# Patient Record
Sex: Male | Born: 1945
Health system: Southern US, Community
[De-identification: ages and names within clinical notes are randomized; demographics above are authoritative.]

## PROBLEM LIST (undated history)

## (undated) DIAGNOSIS — I619 Nontraumatic intracerebral hemorrhage, unspecified: Secondary | ICD-10-CM

## (undated) DIAGNOSIS — E559 Vitamin D deficiency, unspecified: Secondary | ICD-10-CM

## (undated) DIAGNOSIS — E291 Testicular hypofunction: Secondary | ICD-10-CM

## (undated) DIAGNOSIS — E785 Hyperlipidemia, unspecified: Secondary | ICD-10-CM

## (undated) DIAGNOSIS — C61 Malignant neoplasm of prostate: Secondary | ICD-10-CM

## (undated) DIAGNOSIS — I10 Essential (primary) hypertension: Secondary | ICD-10-CM

## (undated) DIAGNOSIS — I2510 Atherosclerotic heart disease of native coronary artery without angina pectoris: Secondary | ICD-10-CM

## (undated) DIAGNOSIS — I4821 Permanent atrial fibrillation: Secondary | ICD-10-CM

## (undated) DIAGNOSIS — I4891 Unspecified atrial fibrillation: Secondary | ICD-10-CM

## (undated) DIAGNOSIS — F419 Anxiety disorder, unspecified: Secondary | ICD-10-CM

## (undated) HISTORY — PX: COLONOSCOPY: SHX174

## (undated) HISTORY — DX: Unspecified atrial fibrillation: I48.91

## (undated) HISTORY — DX: Hyperlipidemia, unspecified: E78.5

## (undated) HISTORY — DX: Nontraumatic intracerebral hemorrhage, unspecified: I61.9

## (undated) HISTORY — DX: Malignant neoplasm of prostate: C61

## (undated) HISTORY — DX: Permanent atrial fibrillation: I48.21

## (undated) HISTORY — DX: Vitamin D deficiency, unspecified: E55.9

## (undated) HISTORY — DX: Atherosclerotic heart disease of native coronary artery without angina pectoris: I25.10

## (undated) HISTORY — DX: Testicular hypofunction: E29.1

---

## 1999-10-07 ENCOUNTER — Ambulatory Visit (HOSPITAL_COMMUNITY): Admission: RE | Admit: 1999-10-07 | Discharge: 1999-10-07 | Payer: Self-pay | Admitting: Internal Medicine

## 1999-10-07 ENCOUNTER — Encounter: Payer: Self-pay | Admitting: Internal Medicine

## 1999-10-10 ENCOUNTER — Ambulatory Visit (HOSPITAL_COMMUNITY): Admission: RE | Admit: 1999-10-10 | Discharge: 1999-10-10 | Payer: Self-pay | Admitting: Internal Medicine

## 1999-10-10 ENCOUNTER — Encounter: Payer: Self-pay | Admitting: Internal Medicine

## 2005-02-03 ENCOUNTER — Ambulatory Visit (HOSPITAL_COMMUNITY): Admission: RE | Admit: 2005-02-03 | Discharge: 2005-02-03 | Payer: Self-pay | Admitting: Internal Medicine

## 2005-03-17 ENCOUNTER — Ambulatory Visit (HOSPITAL_COMMUNITY): Admission: RE | Admit: 2005-03-17 | Discharge: 2005-03-17 | Payer: Self-pay | Admitting: Internal Medicine

## 2005-12-28 ENCOUNTER — Ambulatory Visit (HOSPITAL_COMMUNITY): Admission: RE | Admit: 2005-12-28 | Discharge: 2005-12-28 | Payer: Self-pay | Admitting: Internal Medicine

## 2009-07-09 ENCOUNTER — Emergency Department (HOSPITAL_COMMUNITY): Admission: EM | Admit: 2009-07-09 | Discharge: 2009-07-09 | Payer: Self-pay | Admitting: Emergency Medicine

## 2010-03-16 ENCOUNTER — Encounter: Payer: Self-pay | Admitting: Internal Medicine

## 2010-05-12 LAB — POCT CARDIAC MARKERS
Myoglobin, poc: 75.2 ng/mL (ref 12–200)
Myoglobin, poc: 88 ng/mL (ref 12–200)
Troponin i, poc: <0.05 ng/mL (ref 0.00–0.09)

## 2011-12-22 ENCOUNTER — Encounter: Payer: Self-pay | Admitting: Gastroenterology

## 2012-03-04 ENCOUNTER — Ambulatory Visit (AMBULATORY_SURGERY_CENTER): Payer: Commercial Managed Care - PPO | Admitting: *Deleted

## 2012-03-04 VITALS — Ht 79.0 in | Wt 247.0 lb

## 2012-03-04 DIAGNOSIS — Z1211 Encounter for screening for malignant neoplasm of colon: Secondary | ICD-10-CM

## 2012-03-04 MED ORDER — NA SULFATE-K SULFATE-MG SULF 17.5-3.13-1.6 GM/177ML PO SOLN: ORAL | Status: DC

## 2012-03-17 ENCOUNTER — Ambulatory Visit (AMBULATORY_SURGERY_CENTER): Payer: Commercial Managed Care - PPO | Admitting: Gastroenterology

## 2012-03-17 ENCOUNTER — Encounter: Payer: Self-pay | Admitting: Gastroenterology

## 2012-03-17 VITALS — BP 145/87 | HR 53 | Temp 96.3°F | Resp 15 | Ht 79.0 in | Wt 247.0 lb

## 2012-03-17 DIAGNOSIS — Z1211 Encounter for screening for malignant neoplasm of colon: Secondary | ICD-10-CM

## 2012-03-17 MED ORDER — SODIUM CHLORIDE 0.9 % IV SOLN: 500.0000 mL | INTRAVENOUS | Status: DC

## 2012-03-17 NOTE — Progress Notes (Signed)
VSS A&O x3 Pleased with anesthesia care. Report to Rosalita Chessman RN DRM

## 2012-03-17 NOTE — Patient Instructions (Addendum)
YOU HAD AN ENDOSCOPIC PROCEDURE TODAY AT THE [facility]: Refer to the procedure report that was given to you for any specific questions about what was found during the examination.  If the procedure report does not answer your questions, please call your gastroenterologist to clarify.  If you requested that your care partner not be given the details of your procedure findings, then the procedure report has been included in a sealed envelope for you to review at your convenience later.  YOU SHOULD EXPECT: Some feelings of bloating in the abdomen. Passage of more gas than usual.  Walking can help get rid of the air that was put into your GI tract during the procedure and reduce the bloating. If you had a lower endoscopy (such as a colonoscopy or flexible sigmoidoscopy) you may notice spotting of blood in your stool or on the toilet paper. If you underwent a bowel prep for your procedure, then you may not have a normal bowel movement for a few days.  DIET: Your first meal following the procedure should be a light meal and then it is ok to progress to your normal diet.  A half-sandwich or bowl of soup is an example of a good first meal.  Heavy or fried foods are harder to digest and may make you feel nauseous or bloated.  Likewise meals heavy in dairy and vegetables can cause extra gas to form and this can also increase the bloating.  Drink plenty of fluids but you should avoid alcoholic beverages for 24 hours.  ACTIVITY: Your care partner should take you home directly after the procedure.  You should plan to take it easy, moving slowly for the rest of the day.  You can resume normal activity the day after the procedure however you should NOT DRIVE or use heavy machinery for 24 hours (because of the sedation medicines used during the test).    SYMPTOMS TO REPORT IMMEDIATELY: A gastroenterologist can be reached at any hour.  During normal business hours, 8:30 AM to 5:00 PM Monday through Friday,  call [phone].  After hours and on weekends, please call the GI answering service at [phone] who will take a message and have the physician on call contact you.   Following lower endoscopy (colonoscopy or flexible sigmoidoscopy):  Excessive amounts of blood in the stool  Significant tenderness or worsening of abdominal pains  Swelling of the abdomen that is new, acute  Fever of 100F or higher  FOLLOW UP: If any biopsies were taken you will be contacted by phone or by letter within the next 1-3 weeks.  Call your gastroenterologist if you have not heard about the biopsies in 3 weeks.  Our staff will call the home number listed on your records the next business day following your procedure to check on you and address any questions or concerns that you may have at that time regarding the information given to you following your procedure. This is a courtesy call and so if there is no answer at the home number and we have not heard from you through the emergency physician on call, we will assume that you have returned to your regular daily activities without incident.  SIGNATURES/CONFIDENTIALITY: You and/or your care partner have signed paperwork which will be entered into your electronic medical record.  These signatures attest to the fact that that the information above on your After Visit Summary has been reviewed and is understood.  Full responsibility of the confidentiality of this   discharge information lies with you and/or your care-partner.   Thank-you for choosing us for your healthcare needs. 

## 2012-03-17 NOTE — Progress Notes (Addendum)
Patient did not have preoperative order for IV antibiotic SSI prophylaxis. (G8918)  Patient did not experience any of the following events: a burn prior to discharge; a fall within the facility; wrong site/side/patient/procedure/implant event; or a hospital transfer or hospital admission upon discharge from the facility. (G8907)  

## 2012-03-17 NOTE — Op Note (Signed)
[facility] [address] [place], [address]   COLONOSCOPY PROCEDURE REPORT  PATIENT: Kenneth, Russell  MR#: [id number] BIRTHDATE: 1945/12/02 , 66  yrs. old GENDER: Male ENDOSCOPIST: Louis Meckel, MD REFERRED NW:[id number] Oneta Rack, M.D. PROCEDURE DATE:  03/17/2012 PROCEDURE:   Colonoscopy, diagnostic ASA CLASS:   Class I INDICATIONS: MEDICATIONS: MAC sedation, administered by CRNA and propofol (Diprivan) 200mg  IV  DESCRIPTION OF PROCEDURE:   After the risks benefits and alternatives of the procedure were thoroughly explained, informed consent was obtained.  A digital rectal exam revealed no abnormalities of the rectum.   The LB CF-[id number]  endoscope was introduced through the anus and advanced to the cecum, which was identified by both the appendix and ileocecal valve. No adverse events experienced.   The quality of the prep was Suprep excellent The instrument was then slowly withdrawn as the colon was fully examined.      COLON FINDINGS: A normal appearing cecum, ileocecal valve, and appendiceal orifice were identified.  The ascending, hepatic flexure, transverse, splenic flexure, descending, sigmoid colon and rectum appeared unremarkable.  No polyps or cancers were seen. Retroflexed views revealed no abnormalities. The time to cecum=6 minutes 22 seconds.  Withdrawal time=6 minutes 30 seconds.  The scope was withdrawn and the procedure completed. COMPLICATIONS: There were no complications.  ENDOSCOPIC IMPRESSION: Normal colon  RECOMMENDATIONS: Continue current colorectal screening recommendations for "routine risk" patients with a repeat colonoscopy in 10 years.   eSigned:  Louis Meckel, MD 03/17/2012 10:30 AM   cc:

## 2012-03-18 ENCOUNTER — Telehealth: Payer: Self-pay | Admitting: *Deleted

## 2012-03-18 NOTE — Telephone Encounter (Signed)
  Follow up Call-  Call back number 03/17/2012  Post procedure Call Back phone  # [phone]  Permission to leave phone message Yes     Patient questions:  Do you have a fever, pain , or abdominal swelling? no Pain Score  0 *  Have you tolerated food without any problems? yes  Have you been able to return to your normal activities? yes  Do you have any questions about your discharge instructions: Diet   no Medications  no Follow up visit  no  Do you have questions or concerns about your Care? no  Actions: * If pain score is 4 or above: No action needed, pain <4.

## 2012-04-28 ENCOUNTER — Other Ambulatory Visit: Payer: Self-pay | Admitting: Urology

## 2012-05-31 ENCOUNTER — Encounter (HOSPITAL_COMMUNITY): Payer: Self-pay | Admitting: Pharmacy Technician

## 2012-06-06 ENCOUNTER — Inpatient Hospital Stay (HOSPITAL_COMMUNITY): Admission: RE | Admit: 2012-06-06 | Payer: Commercial Managed Care - PPO | Source: Ambulatory Visit

## 2012-06-06 NOTE — Patient Instructions (Signed)
Kenneth Russell  06/06/2012   Your procedure is scheduled on:  06/23/12   Report to [facility] at     0900 AM.  Call this number if you have problems the morning of surgery: [phone]   Remember:   Do not eat food or drink liquids after midnight.   Take these medicines the morning of surgery with A SIP OF WATER:    Do not wear jewelry,   Do not wear lotions, powders, or perfumes.    Men may shave face and neck.  Do not bring valuables to the hospital.  Contacts, dentures or bridgework may not be worn into surgery.  Leave suitcase in the car. After surgery it may be brought to your room.  For patients admitted to the hospital, checkout time is 11:00 AM the day of  discharge.      SEE CHG INSTRUCTION SHEET    Please read over the following fact sheets that you were given: MRSA Information, coughing and deep breathing exercises, leg exercises, Blood transfusion Fact Sheet , Incentive Spirometry Fact sheet                Failure to comply with these instructions may result in cancellation of your surgery.                Patient Signature ____________________________              Nurse Signature _____________________________

## 2012-06-07 ENCOUNTER — Ambulatory Visit (HOSPITAL_COMMUNITY)
Admission: RE | Admit: 2012-06-07 | Discharge: 2012-06-07 | Disposition: A | Discharge status: Home / Self Care | Payer: Commercial Managed Care - PPO | Source: Ambulatory Visit | Attending: Urology | Admitting: Urology

## 2012-06-07 ENCOUNTER — Encounter (HOSPITAL_COMMUNITY): Payer: Self-pay

## 2012-06-07 ENCOUNTER — Encounter (HOSPITAL_COMMUNITY)
Admission: RE | Admit: 2012-06-07 | Discharge: 2012-06-07 | Disposition: A | Discharge status: Home / Self Care | Payer: Commercial Managed Care - PPO | Source: Ambulatory Visit | Attending: Urology | Admitting: Urology

## 2012-06-07 DIAGNOSIS — I10 Essential (primary) hypertension: Secondary | ICD-10-CM | POA: Insufficient documentation

## 2012-06-07 DIAGNOSIS — Z01812 Encounter for preprocedural laboratory examination: Secondary | ICD-10-CM | POA: Insufficient documentation

## 2012-06-07 DIAGNOSIS — I77819 Aortic ectasia, unspecified site: Secondary | ICD-10-CM | POA: Insufficient documentation

## 2012-06-07 DIAGNOSIS — C61 Malignant neoplasm of prostate: Secondary | ICD-10-CM | POA: Insufficient documentation

## 2012-06-07 HISTORY — DX: Essential (primary) hypertension: I10

## 2012-06-07 HISTORY — DX: Anxiety disorder, unspecified: F41.9

## 2012-06-07 LAB — CBC
Hemoglobin: 14.2 g/dL (ref 13.0–17.0)
MCH: 33.6 pg (ref 26.0–34.0)
MCV: 94.6 fL (ref 78.0–100.0)
RBC: 4.23 MIL/uL (ref 4.22–5.81)

## 2012-06-07 LAB — BASIC METABOLIC PANEL
CO2: 29 mEq/L (ref 19–32)
GFR calc non Af Amer: 72 mL/min — ABNORMAL LOW (ref >90)
Glucose, Bld: 95 mg/dL (ref 70–99)
Potassium: 4 mEq/L (ref 3.5–5.1)
Sodium: 140 mEq/L (ref 135–145)

## 2012-06-07 LAB — SURGICAL PCR SCREEN: MRSA, PCR: NEGATIVE

## 2012-06-22 NOTE — H&P (Signed)
  History of Present Illness  Mr. Kenneth Russell is a 67 year old with the following urologic history:  1) Prostate cancer: He initally presented in September 2013 with an elevated PSA of 4.05 (11% free). His PSA was repeated and had further increased to 7.42.  He underwent a prostate biopsy in Sep 2013 which demonstrated atypical glands suspicious but not definite for malignancy. He underwent a repeat biopsy in February 2014 which confirmed Gleason 3+4=7 adenocarcinoma with 5 out of 14 biopsy cores positive for malignancy.  He does have a family history of prostate cancer and his father was treated with brachytherapy in his 80s.  TNM stage: cT1c Nx Mx PSA: 7.42 Gleason score: 3+4=7 Biopsy (04/14/12): 5/14 cores positive    Left: L lateral apex (30%, 3+3=6), L apex (70%, 60%, 3+4=7, PNI), L base (5%, 3+3=6)    Right: R mid (5%, 3+3=6) Prostate volume: 26.7 cc  Nomogram OC disease: 72% EPE: 18% SVI: 5% LNI: 2.7% PFS: 92%, 88%  Baseline urinary function: IPSS 5. Baseline erectile function: SHIM score 25.  2) Testosterone deficiency: His symptoms have included fatigue and decreased energy level.  He has been treated in the past by Dr. Oneta Rack with Androderm and Androgel with both medications working equally well.  I am not aware of his baseline testerone levels. I recommended he stop therapy since there has been concern about possible prostate cancer.     Past Medical History Problems  1. History of  Hypercholesterolemia 272.0 2. History of  Hypertension 401.9  Surgical History Problems  1. History of  No Surgical Problems  Current Meds 1. Aspirin 325 MG Oral Tablet; Therapy: (Recorded:04Sep2013) to 2. Atenolol 50 MG Oral Tablet; Therapy: 08Jan2014 to 3. Doxazosin Mesylate 4 MG Oral Tablet; Therapy: (Recorded:04Sep2013) to 4. Glucosamine CAPS; Therapy: (Recorded:04Sep2013) to 5. Ibuprofen TABS; prn; Therapy: (Recorded:04Sep2013) to 6. Losartan Potassium 100 MG Oral Tablet; Therapy:  29Apr2013 to 7. Simvastatin 80 MG Oral Tablet; Therapy: 23Aug2013 to 8. Triamcinolone Acetonide 0.1 % External Cream; Therapy: 08Jan2014 to 9. ValACYclovir HCl 500 MG Oral Tablet; Therapy: (Recorded:04Sep2013) to 10. Vitamin D-3 TABS; Therapy: (Recorded:04Sep2013) to  Allergies Medication  1. No Known Drug Allergies  Family History Problems  1. Family history of  Death In The Family Father age 67-natural death 2. Family history of  Death In The Family Mother age 35-heart attack 3. Paternal history of  Prostate Cancer V16.42  Social History Problems  1. Alcohol Use 1-2 glasses per day 2. Caffeine Use 1-2 glasses per day 3. Never A Smoker 4. Occupation: Airline pilot    Physical Exam Constitutional: Well nourished and well developed . No acute distress.  Pulmonary: No respiratory distress and normal respiratory rhythm and effort.  Cardiovascular: Heart rate and rhythm are normal . No peripheral edema.  Abdomen: The abdomen is soft and nontender.     Assessment Assessed  1. Prostate Cancer 185   Discussion/Summary  1. Prostate cancer: He has elected surgical treatment and will undergo a bilateral nerve sparing robotic-assisted laparoscopic radical prostatectomy and pelvic lymphadenectomy.

## 2012-06-23 ENCOUNTER — Inpatient Hospital Stay (HOSPITAL_COMMUNITY): Payer: Commercial Managed Care - PPO | Admitting: *Deleted

## 2012-06-23 ENCOUNTER — Encounter (HOSPITAL_COMMUNITY): Payer: Self-pay | Admitting: *Deleted

## 2012-06-23 ENCOUNTER — Inpatient Hospital Stay (HOSPITAL_COMMUNITY)
Admission: RE | Admit: 2012-06-23 | Discharge: 2012-06-24 | DRG: 708 | Disposition: A | Discharge status: Home / Self Care | Payer: Commercial Managed Care - PPO | Source: Ambulatory Visit | Attending: Urology | Admitting: Urology

## 2012-06-23 ENCOUNTER — Encounter (HOSPITAL_COMMUNITY)
Admission: RE | Disposition: A | Discharge status: Home / Self Care | Payer: Self-pay | Source: Ambulatory Visit | Attending: Urology

## 2012-06-23 DIAGNOSIS — Z79899 Other long term (current) drug therapy: Secondary | ICD-10-CM

## 2012-06-23 DIAGNOSIS — E7800 Pure hypercholesterolemia, unspecified: Secondary | ICD-10-CM | POA: Diagnosis present

## 2012-06-23 DIAGNOSIS — C61 Malignant neoplasm of prostate: Principal | ICD-10-CM | POA: Diagnosis present

## 2012-06-23 DIAGNOSIS — Z7982 Long term (current) use of aspirin: Secondary | ICD-10-CM

## 2012-06-23 DIAGNOSIS — Z791 Long term (current) use of non-steroidal anti-inflammatories (NSAID): Secondary | ICD-10-CM

## 2012-06-23 DIAGNOSIS — I10 Essential (primary) hypertension: Secondary | ICD-10-CM | POA: Diagnosis present

## 2012-06-23 HISTORY — PX: LYMPHADENECTOMY: SHX5960

## 2012-06-23 HISTORY — PX: ROBOT ASSISTED LAPAROSCOPIC RADICAL PROSTATECTOMY: SHX5141

## 2012-06-23 LAB — TYPE AND SCREEN: Antibody Screen: NEGATIVE

## 2012-06-23 SURGERY — ROBOTIC ASSISTED LAPAROSCOPIC RADICAL PROSTATECTOMY LEVEL 2
Anesthesia: General | Wound class: Clean Contaminated

## 2012-06-23 MED ORDER — KCL IN DEXTROSE-NACL 20-5-0.45 MEQ/L-%-% IV SOLN
INTRAVENOUS | Status: AC
  Administered 2012-06-23: 1000 mL via INTRAVENOUS
  Filled 2012-06-23: qty 1000

## 2012-06-23 MED ORDER — MORPHINE SULFATE 2 MG/ML IJ SOLN
2.0000 mg | INTRAMUSCULAR | Status: DC | PRN
  Administered 2012-06-23: 2 mg via INTRAVENOUS
  Filled 2012-06-23 (×2): qty 1

## 2012-06-23 MED ORDER — ROCURONIUM BROMIDE 100 MG/10ML IV SOLN
INTRAVENOUS | Status: DC | PRN
  Administered 2012-06-23: 50 mg via INTRAVENOUS
  Administered 2012-06-23: 10 mg via INTRAVENOUS

## 2012-06-23 MED ORDER — FENTANYL CITRATE 0.05 MG/ML IJ SOLN
INTRAMUSCULAR | Status: DC | PRN
  Administered 2012-06-23: 50 ug via INTRAVENOUS
  Administered 2012-06-23 (×2): 25 ug via INTRAVENOUS
  Administered 2012-06-23: 50 ug via INTRAVENOUS
  Administered 2012-06-23 (×2): 25 ug via INTRAVENOUS

## 2012-06-23 MED ORDER — HYDROMORPHONE HCL PF 1 MG/ML IJ SOLN
INTRAMUSCULAR | Status: AC
Start: 2012-06-23 — End: 2012-06-24
  Filled 2012-06-23: qty 1

## 2012-06-23 MED ORDER — OXYCODONE HCL 5 MG PO TABS: 5.0000 mg | ORAL_TABLET | Freq: Once | ORAL | Status: DC | PRN

## 2012-06-23 MED ORDER — DIPHENHYDRAMINE HCL 50 MG/ML IJ SOLN: 12.5000 mg | Freq: Four times a day (QID) | INTRAMUSCULAR | Status: DC | PRN

## 2012-06-23 MED ORDER — PROMETHAZINE HCL 25 MG/ML IJ SOLN: 6.2500 mg | INTRAMUSCULAR | Status: DC | PRN

## 2012-06-23 MED ORDER — DOCUSATE SODIUM 100 MG PO CAPS
100.0000 mg | ORAL_CAPSULE | Freq: Two times a day (BID) | ORAL | Status: DC
  Administered 2012-06-23 – 2012-06-24 (×2): 100 mg via ORAL
  Filled 2012-06-23 (×3): qty 1

## 2012-06-23 MED ORDER — EPHEDRINE SULFATE 50 MG/ML IJ SOLN
INTRAMUSCULAR | Status: DC | PRN
  Administered 2012-06-23 (×3): 5 mg via INTRAVENOUS
  Administered 2012-06-23: 10 mg via INTRAVENOUS
  Administered 2012-06-23: 5 mg via INTRAVENOUS
  Administered 2012-06-23: 10 mg via INTRAVENOUS
  Administered 2012-06-23: 5 mg via INTRAVENOUS

## 2012-06-23 MED ORDER — KCL IN DEXTROSE-NACL 20-5-0.45 MEQ/L-%-% IV SOLN
INTRAVENOUS | Status: DC
  Administered 2012-06-23 – 2012-06-24 (×3): via INTRAVENOUS
  Filled 2012-06-23 (×4): qty 1000

## 2012-06-23 MED ORDER — LACTATED RINGERS IV SOLN
INTRAVENOUS | Status: DC | PRN
  Administered 2012-06-23 (×3): via INTRAVENOUS

## 2012-06-23 MED ORDER — NEOSTIGMINE METHYLSULFATE 1 MG/ML IJ SOLN
INTRAMUSCULAR | Status: DC | PRN
  Administered 2012-06-23: 5 mg via INTRAVENOUS

## 2012-06-23 MED ORDER — CEFAZOLIN SODIUM-DEXTROSE 2-3 GM-% IV SOLR
2.0000 g | INTRAVENOUS | Status: AC
  Administered 2012-06-23: 2 g via INTRAVENOUS

## 2012-06-23 MED ORDER — KETOROLAC TROMETHAMINE 15 MG/ML IJ SOLN
15.0000 mg | Freq: Four times a day (QID) | INTRAMUSCULAR | Status: DC
  Administered 2012-06-23 – 2012-06-24 (×4): 15 mg via INTRAVENOUS
  Filled 2012-06-23 (×7): qty 1

## 2012-06-23 MED ORDER — BUPIVACAINE-EPINEPHRINE 0.25% -1:200000 IJ SOLN
INTRAMUSCULAR | Status: DC | PRN
  Administered 2012-06-23: 27 mL

## 2012-06-23 MED ORDER — HEPARIN SODIUM (PORCINE) 1000 UNIT/ML IJ SOLN
INTRAMUSCULAR | Status: AC
  Filled 2012-06-23: qty 1

## 2012-06-23 MED ORDER — GLYCOPYRROLATE 0.2 MG/ML IJ SOLN
INTRAMUSCULAR | Status: DC | PRN
  Administered 2012-06-23: 0.6 mg via INTRAVENOUS

## 2012-06-23 MED ORDER — CEFAZOLIN SODIUM 1-5 GM-% IV SOLN
1.0000 g | Freq: Three times a day (TID) | INTRAVENOUS | Status: AC
  Administered 2012-06-23 – 2012-06-24 (×2): 1 g via INTRAVENOUS
  Filled 2012-06-23 (×2): qty 50

## 2012-06-23 MED ORDER — METOCLOPRAMIDE HCL 5 MG/ML IJ SOLN
INTRAMUSCULAR | Status: DC | PRN
  Administered 2012-06-23: 10 mg via INTRAVENOUS

## 2012-06-23 MED ORDER — SODIUM CHLORIDE 0.9 % IV BOLUS (SEPSIS)
1000.0000 mL | Freq: Once | INTRAVENOUS | Status: AC
  Administered 2012-06-23: 1000 mL via INTRAVENOUS

## 2012-06-23 MED ORDER — INDIGOTINDISULFONATE SODIUM 8 MG/ML IJ SOLN
INTRAMUSCULAR | Status: DC | PRN
  Administered 2012-06-23 (×2): 5 mL via INTRAVENOUS

## 2012-06-23 MED ORDER — ONDANSETRON HCL 4 MG/2ML IJ SOLN
INTRAMUSCULAR | Status: DC | PRN
  Administered 2012-06-23: 4 mg via INTRAVENOUS

## 2012-06-23 MED ORDER — OXYCODONE HCL 5 MG/5ML PO SOLN
5.0000 mg | Freq: Once | ORAL | Status: DC | PRN
  Filled 2012-06-23: qty 5

## 2012-06-23 MED ORDER — SIMVASTATIN 40 MG PO TABS
40.0000 mg | ORAL_TABLET | Freq: Every day | ORAL | Status: DC
  Administered 2012-06-23: 40 mg via ORAL
  Filled 2012-06-23 (×2): qty 1

## 2012-06-23 MED ORDER — ACETAMINOPHEN 10 MG/ML IV SOLN: 1000.0000 mg | Freq: Once | INTRAVENOUS | Status: DC | PRN

## 2012-06-23 MED ORDER — HYDROMORPHONE HCL PF 1 MG/ML IJ SOLN
0.2500 mg | INTRAMUSCULAR | Status: DC | PRN
  Administered 2012-06-23 (×2): 0.5 mg via INTRAVENOUS

## 2012-06-23 MED ORDER — HYDROCODONE-ACETAMINOPHEN 5-325 MG PO TABS: 1.0000 | ORAL_TABLET | Freq: Four times a day (QID) | ORAL | Status: DC | PRN

## 2012-06-23 MED ORDER — MIDAZOLAM HCL 5 MG/5ML IJ SOLN
INTRAMUSCULAR | Status: DC | PRN
  Administered 2012-06-23: 2 mg via INTRAVENOUS

## 2012-06-23 MED ORDER — LACTATED RINGERS IV SOLN
INTRAVENOUS | Status: DC | PRN
  Administered 2012-06-23: 12:00:00

## 2012-06-23 MED ORDER — PROPOFOL 10 MG/ML IV BOLUS
INTRAVENOUS | Status: DC | PRN
  Administered 2012-06-23: 200 mg via INTRAVENOUS

## 2012-06-23 MED ORDER — BUPIVACAINE-EPINEPHRINE PF 0.25-1:200000 % IJ SOLN
INTRAMUSCULAR | Status: AC
  Filled 2012-06-23: qty 30

## 2012-06-23 MED ORDER — ACETAMINOPHEN 325 MG PO TABS: 650.0000 mg | ORAL_TABLET | ORAL | Status: DC | PRN

## 2012-06-23 MED ORDER — ACETAMINOPHEN 10 MG/ML IV SOLN
INTRAVENOUS | Status: DC | PRN
  Administered 2012-06-23: 1000 mg via INTRAVENOUS

## 2012-06-23 MED ORDER — ALPRAZOLAM 1 MG PO TABS: 1.0000 mg | ORAL_TABLET | Freq: Every day | ORAL | Status: DC | PRN

## 2012-06-23 MED ORDER — SODIUM CHLORIDE 0.9 % IR SOLN
Status: DC | PRN
  Administered 2012-06-23: 1000 mL via INTRAVESICAL

## 2012-06-23 MED ORDER — ATENOLOL 25 MG PO TABS
25.0000 mg | ORAL_TABLET | Freq: Every day | ORAL | Status: DC
  Administered 2012-06-23: 25 mg via ORAL
  Filled 2012-06-23 (×2): qty 1

## 2012-06-23 MED ORDER — MEPERIDINE HCL 50 MG/ML IJ SOLN: 6.2500 mg | INTRAMUSCULAR | Status: DC | PRN

## 2012-06-23 MED ORDER — CIPROFLOXACIN HCL 500 MG PO TABS: 500.0000 mg | ORAL_TABLET | Freq: Two times a day (BID) | ORAL | Status: DC

## 2012-06-23 MED ORDER — HYDROMORPHONE HCL PF 1 MG/ML IJ SOLN
INTRAMUSCULAR | Status: DC | PRN
  Administered 2012-06-23 (×2): 1 mg via INTRAVENOUS

## 2012-06-23 MED ORDER — INDIGOTINDISULFONATE SODIUM 8 MG/ML IJ SOLN
INTRAMUSCULAR | Status: AC
  Filled 2012-06-23: qty 10

## 2012-06-23 MED ORDER — DIPHENHYDRAMINE HCL 12.5 MG/5ML PO ELIX: 12.5000 mg | ORAL_SOLUTION | Freq: Four times a day (QID) | ORAL | Status: DC | PRN

## 2012-06-23 SURGICAL SUPPLY — 45 items
CANISTER SUCTION 2500CC (MISCELLANEOUS) ×3 IMPLANT
CANNULA SEAL DVNC (CANNULA) IMPLANT
CANNULA SEALS DA VINCI (CANNULA) ×1
CATH FOLEY 2WAY SLVR 18FR 30CC (CATHETERS) ×3 IMPLANT
CATH ROBINSON RED A/P 16FR (CATHETERS) ×3 IMPLANT
CATH ROBINSON RED A/P 8FR (CATHETERS) ×3 IMPLANT
CATH TIEMANN FOLEY 18FR 5CC (CATHETERS) ×3 IMPLANT
CHLORAPREP W/TINT 26ML (MISCELLANEOUS) ×3 IMPLANT
CLIP LIGATING HEM O LOK PURPLE (MISCELLANEOUS) ×6 IMPLANT
CLOTH BEACON ORANGE TIMEOUT ST (SAFETY) ×3 IMPLANT
CORD HIGH FREQUENCY UNIPOLAR (ELECTROSURGICAL) ×3 IMPLANT
COVER SURGICAL LIGHT HANDLE (MISCELLANEOUS) ×3 IMPLANT
COVER TIP SHEARS 8 DVNC (MISCELLANEOUS) ×2 IMPLANT
COVER TIP SHEARS 8MM DA VINCI (MISCELLANEOUS) ×1
CUTTER ECHEON FLEX ENDO 45 340 (ENDOMECHANICALS) ×3 IMPLANT
DECANTER SPIKE VIAL GLASS SM (MISCELLANEOUS) ×3 IMPLANT
DRAPE SURG IRRIG POUCH 19X23 (DRAPES) ×3 IMPLANT
DRSG TEGADERM 2-3/8X2-3/4 SM (GAUZE/BANDAGES/DRESSINGS) ×12 IMPLANT
DRSG TEGADERM 4X4.75 (GAUZE/BANDAGES/DRESSINGS) ×6 IMPLANT
DRSG TEGADERM 6X8 (GAUZE/BANDAGES/DRESSINGS) ×6 IMPLANT
ELECT REM PT RETURN 9FT ADLT (ELECTROSURGICAL) ×3
ELECTRODE REM PT RTRN 9FT ADLT (ELECTROSURGICAL) ×2 IMPLANT
GAUZE SPONGE 2X2 8PLY STRL LF (GAUZE/BANDAGES/DRESSINGS) ×2 IMPLANT
GLOVE BIO SURGEON STRL SZ 6.5 (GLOVE) ×3 IMPLANT
GLOVE BIOGEL M STRL SZ7.5 (GLOVE) ×6 IMPLANT
GOWN STRL NON-REIN LRG LVL3 (GOWN DISPOSABLE) ×9 IMPLANT
GOWN STRL REIN XL XLG (GOWN DISPOSABLE) ×6 IMPLANT
HOLDER FOLEY CATH W/STRAP (MISCELLANEOUS) ×3 IMPLANT
IV LACTATED RINGERS 1000ML (IV SOLUTION) ×3 IMPLANT
KIT ACCESSORY DA VINCI DISP (KITS) ×1
KIT ACCESSORY DVNC DISP (KITS) ×2 IMPLANT
NDL SAFETY ECLIPSE 18X1.5 (NEEDLE) ×2 IMPLANT
NEEDLE HYPO 18GX1.5 SHARP (NEEDLE) ×3
PACK ROBOT UROLOGY CUSTOM (CUSTOM PROCEDURE TRAY) ×3 IMPLANT
RELOAD GREEN ECHELON 45 (STAPLE) ×3 IMPLANT
SET TUBE IRRIG SUCTION NO TIP (IRRIGATION / IRRIGATOR) ×3 IMPLANT
SHEET LAVH (DRAPES) ×1 IMPLANT
SOLUTION ELECTROLUBE (MISCELLANEOUS) ×3 IMPLANT
SPONGE GAUZE 2X2 STER 10/PKG (GAUZE/BANDAGES/DRESSINGS) ×1
SUT ETHILON 3 0 PS 1 (SUTURE) ×3 IMPLANT
SUT VICRYL 0 UR6 27IN ABS (SUTURE) ×6 IMPLANT
SYR 27GX1/2 1ML LL SAFETY (SYRINGE) ×3 IMPLANT
TOWEL OR 17X26 10 PK STRL BLUE (TOWEL DISPOSABLE) ×3 IMPLANT
TOWEL OR NON WOVEN STRL DISP B (DISPOSABLE) ×3 IMPLANT
WATER STERILE IRR 1500ML POUR (IV SOLUTION) ×6 IMPLANT

## 2012-06-23 NOTE — Op Note (Signed)

## 2012-06-23 NOTE — Preoperative (Signed)
Beta Blockers   Reason not to administer Beta Blockers:Not Applicable 

## 2012-06-23 NOTE — Transfer of Care (Signed)
Immediate Anesthesia Transfer of Care Note  Patient: Kenneth Russell  Procedure(s) Performed: Procedure(s): ROBOTIC ASSISTED LAPAROSCOPIC RADICAL PROSTATECTOMY LEVEL 2 (N/A) LYMPHADENECTOMY (Bilateral)  Patient Location: PACU  Anesthesia Type:General  Level of Consciousness: awake, alert , oriented, patient cooperative and responds to stimulation  Airway & Oxygen Therapy: Patient Spontanous Breathing and Patient connected to face mask oxygen  Post-op Assessment: Report given to PACU RN, Post -op Vital signs reviewed and stable and Patient moving all extremities  Post vital signs: Reviewed and stable  Complications: No apparent anesthesia complications

## 2012-06-23 NOTE — Anesthesia Postprocedure Evaluation (Signed)
Anesthesia Post Note  Patient: Kenneth Russell  Procedure(s) Performed: Procedure(s) (LRB): ROBOTIC ASSISTED LAPAROSCOPIC RADICAL PROSTATECTOMY LEVEL 2 (N/A) LYMPHADENECTOMY (Bilateral)  Anesthesia type: General  Patient location: PACU  Post pain: Pain level controlled  Post assessment: Post-op Vital signs reviewed  Last Vitals: BP 134/79  Pulse 57  Temp(Src) 36.3 C (Oral)  Resp 16  Ht 6\' 7"  (2.007 m)  Wt 240 lb 15.4 oz (109.3 kg)  BMI 27.13 kg/m2  SpO2 100%  Post vital signs: Reviewed  Level of consciousness: sedated  Complications: No apparent anesthesia complications

## 2012-06-23 NOTE — Anesthesia Preprocedure Evaluation (Addendum)
Anesthesia Evaluation  Patient identified by MRN, date of birth, ID band Patient awake    Reviewed: Allergy & Precautions, H&P , NPO status , Patient's Chart, lab work & pertinent test results, reviewed documented beta blocker date and time   Airway Mallampati: II TM Distance: >3 FB Neck ROM: Full    Dental  (+) Dental Advisory Given and Teeth Intact   Pulmonary neg pulmonary ROS,  breath sounds clear to auscultation        Cardiovascular hypertension, Pt. on medications and Pt. on home beta blockers Rhythm:Regular Rate:Normal     Neuro/Psych Anxiety negative neurological ROS     GI/Hepatic negative GI ROS, Neg liver ROS,   Endo/Other  negative endocrine ROS  Renal/GU negative Renal ROS     Musculoskeletal negative musculoskeletal ROS (+)   Abdominal   Peds  Hematology negative hematology ROS (+)   Anesthesia Other Findings   Reproductive/Obstetrics                          Anesthesia Physical Anesthesia Plan  ASA: II  Anesthesia Plan: General   Post-op Pain Management:    Induction: Intravenous  Airway Management Planned: Oral ETT  Additional Equipment:   Intra-op Plan:   Post-operative Plan: Extubation in OR  Informed Consent: I have reviewed the patients History and Physical, chart, labs and discussed the procedure including the risks, benefits and alternatives for the proposed anesthesia with the patient or authorized representative who has indicated his/her understanding and acceptance.   Dental advisory given  Plan Discussed with: CRNA  Anesthesia Plan Comments:         Anesthesia Quick Evaluation

## 2012-06-24 ENCOUNTER — Encounter (HOSPITAL_COMMUNITY): Payer: Self-pay | Admitting: Urology

## 2012-06-24 LAB — HEMOGLOBIN AND HEMATOCRIT, BLOOD: HCT: 34.9 % — ABNORMAL LOW (ref 39.0–52.0)

## 2012-06-24 MED ORDER — BISACODYL 10 MG RE SUPP
10.0000 mg | Freq: Once | RECTAL | Status: AC
  Administered 2012-06-24: 10 mg via RECTAL
  Filled 2012-06-24: qty 1

## 2012-06-24 MED ORDER — HYDROCODONE-ACETAMINOPHEN 5-325 MG PO TABS
1.0000 | ORAL_TABLET | Freq: Four times a day (QID) | ORAL | Status: DC | PRN
  Administered 2012-06-24: 2 via ORAL
  Filled 2012-06-24: qty 2

## 2012-06-24 NOTE — Care Management Note (Signed)
    Page 1 of 1   06/24/2012     4:31:50 PM   CARE MANAGEMENT NOTE 06/24/2012  Patient:  Kenneth Russell, Kenneth Russell   Account Number:  [id number]  Date Initiated:  06/24/2012  Documentation initiated by:  Lanier Clam  Subjective/Objective Assessment:   ADMITTED W/ELEVATED PSA     Action/Plan:   FROM HOME   Anticipated DC Date:  06/24/2012   Anticipated DC Plan:  HOME/SELF CARE      DC Planning Services  CM consult      Choice offered to / List presented to:             Status of service:  Completed, signed off Medicare Important Message given?   (If response is "NO", the following Medicare IM given date fields will be blank) Date Medicare IM given:   Date Additional Medicare IM given:    Discharge Disposition:  HOME/SELF CARE  Per UR Regulation:  Reviewed for med. necessity/level of care/duration of stay  If discussed at Long Length of Stay Meetings, dates discussed:    Comments:  06/24/12 [facility] RN,BSN [phone] [phone]

## 2012-06-24 NOTE — Discharge Summary (Signed)
  Date of admission: 06/23/2012  Date of discharge: 06/24/2012  Admission diagnosis: Prostate Cancer  Discharge diagnosis: Prostate Cancer  History and Physical: For full details, please see admission history and physical. Briefly, Kenneth Russell is a 67 y.o. gentleman with localized prostate cancer.  After discussing management/treatment options, he elected to proceed with surgical treatment.  Hospital Course: KHRYSTIAN SCHAUF was taken to the operating room on 06/23/2012 and underwent a robotic assisted laparoscopic radical prostatectomy. He tolerated this procedure well and without complications. Postoperatively, he was able to be transferred to a regular hospital room following recovery from anesthesia.  He was able to begin ambulating the night of surgery. He remained hemodynamically stable overnight.  He had excellent urine output with appropriately minimal output from his pelvic drain and his pelvic drain was removed on POD #1.  He was transitioned to oral pain medication, tolerated a clear liquid diet, and had met all discharge criteria and was able to be discharged home later on POD#1.  Laboratory values:  Recent Labs  06/23/12 1430 06/24/12 0440  HGB 13.0 11.7*  HCT 37.5* 34.9*    Disposition: Home  Discharge instruction: He was instructed to be ambulatory but to refrain from heavy lifting, strenuous activity, or driving. He was instructed on urethral catheter care.  Discharge medications:     Medication List    STOP taking these medications       aspirin 325 MG tablet     doxazosin 4 MG tablet  Commonly known as:  CARDURA     ibuprofen 200 MG tablet  Commonly known as:  ADVIL,MOTRIN     Vitamin D-3 5000 UNITS Tabs      TAKE these medications       ALPRAZolam 1 MG tablet  Commonly known as:  XANAX  Take 1 mg by mouth daily as needed for sleep.     atenolol 25 MG tablet  Commonly known as:  TENORMIN  Take 25 mg by mouth at bedtime.     ciprofloxacin 500 MG tablet   Commonly known as:  CIPRO  Take 1 tablet (500 mg total) by mouth 2 (two) times daily. Start day prior to office visit for foley removal     diphenhydrAMINE 25 mg capsule  Commonly known as:  BENADRYL  Take 25 mg by mouth at bedtime as needed for sleep.     HYDROcodone-acetaminophen 5-325 MG per tablet  Commonly known as:  NORCO  Take 1-2 tablets by mouth every 6 (six) hours as needed for pain.     losartan 50 MG tablet  Commonly known as:  COZAAR  Take 50 mg by mouth at bedtime.     simvastatin 40 MG tablet  Commonly known as:  ZOCOR  Take 40 mg by mouth every evening.     triamcinolone cream 0.1 %  Commonly known as:  KENALOG  Apply 1 application topically daily as needed (for itching on scalp).     valACYclovir 500 MG tablet  Commonly known as:  VALTREX  Take 500 mg by mouth 2 (two) times daily. As needed        Followup: He will followup in 1 week for catheter removal and to discuss his surgical pathology results.

## 2012-06-24 NOTE — Progress Notes (Signed)
Patient ID: Kenneth Russell, male   DOB: Sep 21, 1945, 67 y.o.   MRN: [id number]  1 Day Post-Op Subjective: The patient is doing well.  No nausea or vomiting. Pain is adequately controlled.  Objective: Vital signs in last 24 hours: Temp:  [97.3 F (36.3 C)-98.7 F (37.1 C)] 98.7 F (37.1 C) (05/02 0510) Pulse Rate:  [49-75] 49 (05/02 0510) Resp:  [10-18] 16 (05/02 0510) BP: (119-174)/(71-99) 119/71 mmHg (05/02 0510) SpO2:  [98 %-100 %] 99 % (05/02 0510) Weight:  [109.3 kg (240 lb 15.4 oz)] 109.3 kg (240 lb 15.4 oz) (05/01 1530)  Intake/Output from previous day: 05/01 0701 - 05/02 0700 In: 5697.5 [P.O.:480; I.V.:4112.5; IV Piggyback:1000] Out: 2050 [Urine:2025; Blood:25] Intake/Output this shift:    Physical Exam:  General: Alert and oriented. CV: RRR Lungs: Clear bilaterally. GI: Soft, Nondistended. Incisions: Dressings intact. Urine: Clear Extremities: Nontender, no erythema, no edema.  Lab Results:  Recent Labs  06/23/12 1430 06/24/12 0440  HGB 13.0 11.7*  HCT 37.5* 34.9*      Assessment/Plan: POD# 1 s/p robotic prostatectomy.  1) SL IVF 2) Ambulate, Incentive spirometry 3) Transition to oral pain medication 4) Dulcolax suppository 5) D/C pelvic drain 6) Plan for likely discharge later today   Moody Bruins. MD   LOS: 1 day   Zannie Runkle,LES 06/24/2012, 7:17 AM

## 2012-09-28 ENCOUNTER — Other Ambulatory Visit: Payer: Self-pay

## 2012-12-29 ENCOUNTER — Other Ambulatory Visit: Payer: Self-pay

## 2013-02-01 ENCOUNTER — Other Ambulatory Visit: Payer: Self-pay | Admitting: Internal Medicine

## 2013-03-10 ENCOUNTER — Other Ambulatory Visit: Payer: Self-pay | Admitting: Emergency Medicine

## 2013-03-10 DIAGNOSIS — E559 Vitamin D deficiency, unspecified: Secondary | ICD-10-CM | POA: Insufficient documentation

## 2013-03-10 DIAGNOSIS — I10 Essential (primary) hypertension: Secondary | ICD-10-CM | POA: Insufficient documentation

## 2013-03-10 DIAGNOSIS — F419 Anxiety disorder, unspecified: Secondary | ICD-10-CM | POA: Insufficient documentation

## 2013-03-10 DIAGNOSIS — E349 Endocrine disorder, unspecified: Secondary | ICD-10-CM

## 2013-03-10 DIAGNOSIS — E785 Hyperlipidemia, unspecified: Secondary | ICD-10-CM | POA: Insufficient documentation

## 2013-03-13 ENCOUNTER — Ambulatory Visit (INDEPENDENT_AMBULATORY_CARE_PROVIDER_SITE_OTHER): Payer: Commercial Managed Care - PPO | Admitting: Internal Medicine

## 2013-03-13 ENCOUNTER — Encounter: Payer: Self-pay | Admitting: Internal Medicine

## 2013-03-13 VITALS — BP 134/88 | HR 60 | Temp 98.1°F | Resp 18 | Wt 249.6 lb

## 2013-03-13 DIAGNOSIS — R7309 Other abnormal glucose: Secondary | ICD-10-CM

## 2013-03-13 DIAGNOSIS — E782 Mixed hyperlipidemia: Secondary | ICD-10-CM

## 2013-03-13 DIAGNOSIS — B002 Herpesviral gingivostomatitis and pharyngotonsillitis: Secondary | ICD-10-CM

## 2013-03-13 DIAGNOSIS — C61 Malignant neoplasm of prostate: Secondary | ICD-10-CM

## 2013-03-13 DIAGNOSIS — E291 Testicular hypofunction: Secondary | ICD-10-CM

## 2013-03-13 DIAGNOSIS — Z79899 Other long term (current) drug therapy: Secondary | ICD-10-CM

## 2013-03-13 DIAGNOSIS — Z8546 Personal history of malignant neoplasm of prostate: Secondary | ICD-10-CM | POA: Insufficient documentation

## 2013-03-13 DIAGNOSIS — E559 Vitamin D deficiency, unspecified: Secondary | ICD-10-CM

## 2013-03-13 DIAGNOSIS — I10 Essential (primary) hypertension: Secondary | ICD-10-CM

## 2013-03-13 DIAGNOSIS — G4700 Insomnia, unspecified: Secondary | ICD-10-CM

## 2013-03-13 HISTORY — DX: Malignant neoplasm of prostate: C61

## 2013-03-13 LAB — LIPID PANEL
CHOLESTEROL: 184 mg/dL (ref 0–200)
HDL: 77 mg/dL (ref >39)
LDL Cholesterol: 94 mg/dL (ref 0–99)
TRIGLYCERIDES: 64 mg/dL (ref <150)
Total CHOL/HDL Ratio: 2.4 Ratio
VLDL: 13 mg/dL (ref 0–40)

## 2013-03-13 LAB — HEMOGLOBIN A1C
Hgb A1c MFr Bld: 5 % (ref <5.7)
Mean Plasma Glucose: 97 mg/dL (ref <117)

## 2013-03-13 LAB — HEPATIC FUNCTION PANEL
ALBUMIN: 4.2 g/dL (ref 3.5–5.2)
ALT: 15 U/L (ref 0–53)
AST: 26 U/L (ref 0–37)
Alkaline Phosphatase: 57 U/L (ref 39–117)
BILIRUBIN DIRECT: 0.4 mg/dL — AB (ref 0.0–0.3)
BILIRUBIN TOTAL: 2.1 mg/dL — AB (ref 0.3–1.2)
Indirect Bilirubin: 1.7 mg/dL — ABNORMAL HIGH (ref 0.0–0.9)
Total Protein: 6.9 g/dL (ref 6.0–8.3)

## 2013-03-13 LAB — CBC WITH DIFFERENTIAL/PLATELET
BASOS ABS: 0.1 10*3/uL (ref 0.0–0.1)
Basophils Relative: 2 % — ABNORMAL HIGH (ref 0–1)
EOS ABS: 0.3 10*3/uL (ref 0.0–0.7)
EOS PCT: 6 % — AB (ref 0–5)
HCT: 41.2 % (ref 39.0–52.0)
Hemoglobin: 14.1 g/dL (ref 13.0–17.0)
LYMPHS PCT: 30 % (ref 12–46)
Lymphs Abs: 1.3 10*3/uL (ref 0.7–4.0)
MCH: 33.7 pg (ref 26.0–34.0)
MCHC: 34.2 g/dL (ref 30.0–36.0)
MCV: 98.6 fL (ref 78.0–100.0)
Monocytes Absolute: 0.5 10*3/uL (ref 0.1–1.0)
Monocytes Relative: 12 % (ref 3–12)
NEUTROS PCT: 50 % (ref 43–77)
Neutro Abs: 2.2 10*3/uL (ref 1.7–7.7)
PLATELETS: 186 10*3/uL (ref 150–400)
RBC: 4.18 MIL/uL — AB (ref 4.22–5.81)
RDW: 12.7 % (ref 11.5–15.5)
WBC: 4.3 10*3/uL (ref 4.0–10.5)

## 2013-03-13 LAB — BASIC METABOLIC PANEL WITH GFR
BUN: 23 mg/dL (ref 6–23)
CALCIUM: 9.3 mg/dL (ref 8.4–10.5)
CO2: 27 mEq/L (ref 19–32)
CREATININE: 1.06 mg/dL (ref 0.50–1.35)
Chloride: 105 mEq/L (ref 96–112)
GFR, EST NON AFRICAN AMERICAN: 72 mL/min
GFR, Est African American: 84 mL/min
Glucose, Bld: 103 mg/dL — ABNORMAL HIGH (ref 70–99)
Potassium: 4.8 mEq/L (ref 3.5–5.3)
SODIUM: 141 meq/L (ref 135–145)

## 2013-03-13 LAB — TSH: TSH: 1.128 u[IU]/mL (ref 0.350–4.500)

## 2013-03-13 LAB — MAGNESIUM: MAGNESIUM: 2.2 mg/dL (ref 1.5–2.5)

## 2013-03-13 LAB — TESTOSTERONE: TESTOSTERONE: 449 ng/dL (ref 300–890)

## 2013-03-13 MED ORDER — VALACYCLOVIR HCL 500 MG PO TABS: 500.0000 mg | ORAL_TABLET | Freq: Every day | ORAL | Status: DC

## 2013-03-13 MED ORDER — LOSARTAN POTASSIUM 100 MG PO TABS: ORAL_TABLET | ORAL | Status: DC

## 2013-03-13 MED ORDER — SIMVASTATIN 40 MG PO TABS: 40.0000 mg | ORAL_TABLET | Freq: Every evening | ORAL | Status: DC

## 2013-03-13 MED ORDER — ATENOLOL 25 MG PO TABS: 25.0000 mg | ORAL_TABLET | Freq: Every day | ORAL | Status: DC

## 2013-03-13 MED ORDER — DOXAZOSIN MESYLATE 8 MG PO TABS: ORAL_TABLET | ORAL | Status: DC

## 2013-03-13 MED ORDER — ALPRAZOLAM 1 MG PO TABS: 1.0000 mg | ORAL_TABLET | Freq: Every day | ORAL | Status: DC | PRN

## 2013-03-13 NOTE — Patient Instructions (Signed)

## 2013-03-13 NOTE — Progress Notes (Signed)
Patient ID: Kenneth Russell, male   DOB: [birth year], 68 y.o.   MRN: [id number]   This very nice 68 y.o. MWM presents for 3 month follow up with Hypertension, Hyperlipidemia, Pre-Diabetes and Vitamin D Deficiency.    HTN predates since 2003 with initiation of treatment deferred until 2006. BP has been controlled at home. Today's BP: 134/88 mmHg. In 1988 he had a SAH monitored at [facility] in [place]. In 20007 he had a neg Cardiolyte. Patient denies any cardiac type chest pain, palpitations, dyspnea/orthopnea/PND, dizziness, claudication, or dependent edema.   Hyperlipidemia is controlled with diet & meds. Last Cholesterol was 146, Triglycerides were 101, HDL 58 and LDL  68 in July - all at goal. Patient denies myalgias or other med SE's.    Also, the patient is screened for PreDiabetes with last A1c of  5.1% in Feb 2014. Patient denies any symptoms of reactive hypoglycemia, diabetic polys, paresthesias or visual blurring.   Further, Patient has history of Vitamin D Deficiency of 24 in 2008 with last vitamin D of 68 in July 2014. Patient supplements vitamin D without any suspected side-effects.    Medication List   ALPRAZolam 1 MG tablet  Commonly known as:  XANAX  Take 1 tablet (1 mg total) by mouth daily as needed for sleep.     aspirin 325 MG tablet  Take 325 mg by mouth daily.     atenolol 25 MG tablet  Commonly known as:  TENORMIN  Take 1 tablet (25 mg total) by mouth at bedtime. For BP     diphenhydrAMINE 25 mg capsule  Commonly known as:  BENADRYL  Take 25 mg by mouth at bedtime as needed for sleep.     doxazosin 8 MG tablet  Commonly known as:  CARDURA  At bedtime for BP & Prostate     losartan 100 MG tablet  Commonly known as:  COZAAR  TAKE 1 TABLET EVERY DAY FOR BLOOD PRESSURE     simvastatin 40 MG tablet  Commonly known as:  ZOCOR  Take 1 tablet (40 mg total) by mouth every evening. For Cholesterol     valACYclovir 500 MG tablet  Commonly known as:  VALTREX  Take 1 tablet (500  mg total) by mouth daily. FOR FEVER BLISTERS     Vitamin D3 5000 UNITS Caps  Take 5,000 Int'l Units by mouth daily.         Allergies  Allergen Reactions  . Ultram [Tramadol] Anaphylaxis    PMHx:   Past Medical History  Diagnosis Date  . Hyperlipidemia   . Hypertension   . Anxiety   . Vitamin D deficiency   . Other testicular hypofunction     FHx:    Reviewed / unchanged  SHx:    Reviewed / unchanged  Systems Review: Constitutional: Denies fever, chills, wt changes, headaches, insomnia, fatigue, night sweats, change in appetite. Eyes: Denies redness, blurred vision, diplopia, discharge, itchy, watery eyes.  ENT: Denies discharge, congestion, post nasal drip, epistaxis, sore throat, earache, hearing loss, dental pain, tinnitus, vertigo, sinus pain, snoring.  CV: Denies chest pain, palpitations, irregular heartbeat, syncope, dyspnea, diaphoresis, orthopnea, PND, claudication, edema. Respiratory: denies cough, dyspnea, DOE, pleurisy, hoarseness, laryngitis, wheezing.  Gastrointestinal: Denies dysphagia, odynophagia, heartburn, reflux, water brash, abdominal pain or cramps, nausea, vomiting, bloating, diarrhea, constipation, hematemesis, melena, hematochezia,  or hemorrhoids. Genitourinary: Denies dysuria, frequency, urgency, nocturia, hesitancy, discharge, hematuria, flank pain. Musculoskeletal: Denies arthralgias, myalgias, stiffness, jt. swelling, pain, limp, strain/sprain.  Skin: Denies pruritus, rash,  hives, warts, acne, eczema, change in skin lesion(s). Neuro: No weakness, tremor, incoordination, spasms, paresthesia, or pain. Psychiatric: Denies confusion, memory loss, or sensory loss. Endo: Denies change in weight, skin, hair change.  Heme/Lymph: No excessive bleeding, bruising, orenlarged lymph nodes.  BP: 134/88  Pulse: 60  Temp: 98.1 F (36.7 C)  Resp: 18    Estimated body mass index is 28.11 kg/(m^2) as calculated from the following:   Height as of 06/23/12: 6'  7" (2.007 m).   Weight as of this encounter: 249 lb 9.6 oz (113.218 kg).  On Exam: Appears well nourished - in no distress. Eyes: PERRLA, EOMs, conjunctiva no swelling or erythema. Sinuses: No frontal/maxillary tenderness ENT/Mouth: EAC's clear, TM's nl w/o erythema, bulging. Nares clear w/o erythema, swelling, exudates. Oropharynx clear without erythema or exudates. Oral hygiene is good. Tongue normal, non obstructing. Hearing intact.  Neck: Supple. Thyroid nl. Car 2+/2+ without bruits, nodes or JVD. Chest: Respirations nl with BS clear & equal w/o rales, rhonchi, wheezing or stridor.  Cor: Heart sounds normal w/ regular rate and rhythm without sig. murmurs, gallops, clicks, or rubs. Peripheral pulses normal and equal  without edema.  Abdomen: Soft & bowel sounds normal. Non-tender w/o guarding, rebound, hernias, masses, or organomegaly.  Lymphatics: Unremarkable.  Musculoskeletal: Full ROM all peripheral extremities, joint stability, 5/5 strength, and normal gait.  Skin: Warm, dry without exposed rashes, lesions, ecchymosis apparent.  Neuro: Cranial nerves intact, reflexes equal bilaterally. Sensory-motor testing grossly intact. Tendon reflexes grossly intact.  Pysch: Alert & oriented x 3. Insight and judgement nl & appropriate. No ideations.  Assessment and Plan:  1. Hypertension - Continue monitor blood pressure at home. Continue diet/meds same.  2. Hyperlipidemia - Continue diet/meds, exercise,& lifestyle modifications. Continue monitor periodic cholesterol/liver & renal functions   3. Pre-diabetes - Continue expectant screening -Continue diet, exercise, lifestyle modifications. Monitor appropriate labs.  4. Vitamin D Deficiency - Continue supplementation.  5. Hypogonadism- monitor levels with Tx deferred with Hx/o Prostate Cancer  Recommended regular exercise, BP monitoring, weight control, and discussed med and SE's. Recommended labs to assess and monitor clinical status. Further  disposition pending results of labs.

## 2013-03-14 LAB — VITAMIN D 25 HYDROXY (VIT D DEFICIENCY, FRACTURES): VIT D 25 HYDROXY: 68 ng/mL (ref 30–89)

## 2013-03-14 LAB — INSULIN, FASTING: INSULIN FASTING, SERUM: 12 u[IU]/mL (ref 3–28)

## 2013-03-17 DIAGNOSIS — H2510 Age-related nuclear cataract, unspecified eye: Secondary | ICD-10-CM

## 2013-03-17 DIAGNOSIS — H2511 Age-related nuclear cataract, right eye: Secondary | ICD-10-CM | POA: Insufficient documentation

## 2013-03-17 HISTORY — DX: Age-related nuclear cataract, unspecified eye: H25.10

## 2013-06-05 ENCOUNTER — Ambulatory Visit: Payer: Self-pay | Admitting: Emergency Medicine

## 2013-08-25 DIAGNOSIS — H40003 Preglaucoma, unspecified, bilateral: Secondary | ICD-10-CM | POA: Insufficient documentation

## 2013-08-28 ENCOUNTER — Encounter: Payer: Self-pay | Admitting: Internal Medicine

## 2013-08-29 ENCOUNTER — Encounter: Payer: Self-pay | Admitting: Internal Medicine

## 2013-08-29 ENCOUNTER — Ambulatory Visit (INDEPENDENT_AMBULATORY_CARE_PROVIDER_SITE_OTHER): Payer: 59 | Admitting: Internal Medicine

## 2013-08-29 VITALS — BP 126/76 | HR 56 | Temp 98.8°F | Resp 16 | Ht 78.0 in | Wt 239.2 lb

## 2013-08-29 DIAGNOSIS — Z0000 Encounter for general adult medical examination without abnormal findings: Secondary | ICD-10-CM

## 2013-08-29 DIAGNOSIS — Z125 Encounter for screening for malignant neoplasm of prostate: Secondary | ICD-10-CM

## 2013-08-29 DIAGNOSIS — R7401 Elevation of levels of liver transaminase levels: Secondary | ICD-10-CM

## 2013-08-29 DIAGNOSIS — R740 Nonspecific elevation of levels of transaminase and lactic acid dehydrogenase [LDH]: Secondary | ICD-10-CM

## 2013-08-29 DIAGNOSIS — I10 Essential (primary) hypertension: Secondary | ICD-10-CM

## 2013-08-29 DIAGNOSIS — Z113 Encounter for screening for infections with a predominantly sexual mode of transmission: Secondary | ICD-10-CM

## 2013-08-29 DIAGNOSIS — Z1212 Encounter for screening for malignant neoplasm of rectum: Secondary | ICD-10-CM

## 2013-08-29 DIAGNOSIS — Z79899 Other long term (current) drug therapy: Secondary | ICD-10-CM | POA: Insufficient documentation

## 2013-08-29 DIAGNOSIS — E559 Vitamin D deficiency, unspecified: Secondary | ICD-10-CM

## 2013-08-29 DIAGNOSIS — E782 Mixed hyperlipidemia: Secondary | ICD-10-CM

## 2013-08-29 LAB — CBC WITH DIFFERENTIAL/PLATELET
Basophils Absolute: 0.1 10*3/uL (ref 0.0–0.1)
Basophils Relative: 2 % — ABNORMAL HIGH (ref 0–1)
EOS ABS: 0.1 10*3/uL (ref 0.0–0.7)
Eosinophils Relative: 4 % (ref 0–5)
HCT: 37.9 % — ABNORMAL LOW (ref 39.0–52.0)
Hemoglobin: 13.6 g/dL (ref 13.0–17.0)
LYMPHS ABS: 1.2 10*3/uL (ref 0.7–4.0)
Lymphocytes Relative: 34 % (ref 12–46)
MCH: 34 pg (ref 26.0–34.0)
MCHC: 35.9 g/dL (ref 30.0–36.0)
MCV: 94.8 fL (ref 78.0–100.0)
MONOS PCT: 12 % (ref 3–12)
Monocytes Absolute: 0.4 10*3/uL (ref 0.1–1.0)
NEUTROS PCT: 48 % (ref 43–77)
Neutro Abs: 1.7 10*3/uL (ref 1.7–7.7)
Platelets: 172 10*3/uL (ref 150–400)
RBC: 4 MIL/uL — AB (ref 4.22–5.81)
RDW: 13.4 % (ref 11.5–15.5)
WBC: 3.5 10*3/uL — ABNORMAL LOW (ref 4.0–10.5)

## 2013-08-29 MED ORDER — ATENOLOL 50 MG PO TABS: 25.0000 mg | ORAL_TABLET | Freq: Every day | ORAL | Status: DC

## 2013-08-29 MED ORDER — SIMVASTATIN 80 MG PO TABS: ORAL_TABLET | ORAL | Status: DC

## 2013-08-29 MED ORDER — DOXAZOSIN MESYLATE 8 MG PO TABS: ORAL_TABLET | ORAL | Status: DC

## 2013-08-29 MED ORDER — ASPIRIN 325 MG PO TABS: ORAL_TABLET | ORAL | Status: DC

## 2013-08-29 NOTE — Progress Notes (Signed)
Patient ID: Kenneth Russell, male   DOB: 01/05/1946, 68 y.o.   MRN: [id number]   Annual Screening Comprehensive Examination  This very nice 68 y.o.MWM presents for complete physical.  Patient has been followed for HTN,  Prediabetes, Hyperlipidemia, and Vitamin D Deficiency.Patient also has Hx/o Prostate Cancer undergoing robotic SP Prostatectomy in May 2014.   HTN predates since 2003- altho treatment wasn't started until 2006. Patient's BP has been controlled at home.Today's BP: 126/76 mmHg. Patient denies any cardiac symptoms as chest pain, palpitations, shortness of breath, dizziness or ankle swelling. Patient does report several recent episode while playing tennis and after several hours of playing and being slightly light-headed and "seeing bright lights", but it has not affected his vision or playing. He denies any HA or Hx/o migraine or equivalent. Patient reports random BP's have ranged 125-135/75.   Patient's hyperlipidemia is controlled with diet and medications. Patient denies myalgias or other medication SE's. Last cholesterol last visit was at goal as below. Lab Results  Component Value Date   CHOL 184 03/13/2013   HDL 77 03/13/2013   LDLCALC 94 03/13/2013   TRIG 64 03/13/2013   CHOLHDL 2.4 03/13/2013    Patient is screened for prediabetes and last A1c was  5.0% in Jan 2015.  Patient denies reactive hypoglycemic symptoms, visual blurring, diabetic polys or paresthesias.    Finally, patient has history of Vitamin D Deficiency of 24 in 2008 and last vitamin D was 91 in Jan 2015.   Medication Sig  . ALPRAZolam MG tablet Take 1 tablet  daily as needed   . aspirin 325 MG tablet Take 325 mg by mouth daily.  Marland Kitchen atenolol  50 MG tablet Take 1/2 tablet  at bedtime. For BP  . VITAMIN D3 5000 UNITS  Take 5,000 Int'l Units daily.  . diphenhydrAMINE  25 mg cap Take 25 mg  at bedtime as needed   . doxazosin  8 MG tablet  Takes 1/2 tb = 4 mg at bedtime for BP & Prostate  . losartan  50 MG tablet TAKE  1 TABLET FOR BP  . simvastatin  80 MG tablet Take 1/2 tab = 40 mg every evening  . valACYclovir  500 MG tablet Take 1 tablet  daily   Allergies  Allergen Reactions  . Ultram [Tramadol] Anaphylaxis   Past Medical History  Diagnosis Date  . Hyperlipidemia   . Hypertension   . Anxiety   . Vitamin D deficiency   . Other testicular hypofunction    Past Surgical History  Procedure Laterality Date  . Colonoscopy    . Robot assisted laparoscopic radical prostatectomy N/A 06/23/2012    Procedure: ROBOTIC ASSISTED LAPAROSCOPIC RADICAL PROSTATECTOMY LEVEL 2;  Surgeon: Dutch Gray, MD;  Location: [facility];  Service: Urology;  Laterality: N/A;  . Lymphadenectomy Bilateral 06/23/2012    Procedure: LYMPHADENECTOMY;  Surgeon: Dutch Gray, MD;  Location: [facility];  Service: Urology;  Laterality: Bilateral;   Family History  Problem Relation Age of Onset  . Colon cancer Father   . Esophageal cancer Neg Hx   . Rectal cancer Neg Hx   . Stomach cancer Neg Hx    History   Social History  . Marital Status: Married    Spouse Name: N/A    Number of Children: N/A  . Years of Education: N/A   Occupational History  . Engineer, agricultural for [employer]   Social History Main Topics  . Smoking status: Never Smoker   . Smokeless tobacco:  Never Used  . Alcohol Use: 3.0 oz/week    5 Cans of beer per week  . Drug Use: No  . Sexual Activity: Active    ROS Constitutional: Denies fever, chills, weight loss/gain, headaches, insomnia, fatigue, night sweats or change in appetite. Eyes: Denies redness, blurred vision, diplopia, discharge, itchy or watery eyes.  ENT: Denies discharge, congestion, post nasal drip, epistaxis, sore throat, earache, hearing loss, dental pain, Tinnitus, Vertigo, Sinus pain or snoring.  Cardio: Denies chest pain, palpitations, irregular heartbeat, syncope, dyspnea, diaphoresis, orthopnea, PND, claudication or edema Respiratory: denies cough, dyspnea, DOE, pleurisy, hoarseness, laryngitis  or wheezing.  Gastrointestinal: Denies dysphagia, heartburn, reflux, water brash, pain, cramps, nausea, vomiting, bloating, diarrhea, constipation, hematemesis, melena, hematochezia, jaundice or hemorrhoids Genitourinary: Denies dysuria, frequency, urgency, nocturia, hesitancy, discharge, hematuria or flank pain Musculoskeletal: Denies arthralgia, myalgia, stiffness, Jt. Swelling, pain, limp or strain/sprain. Skin: Denies puritis, rash, hives, warts, acne, eczema or change in skin lesion Neuro: No weakness, tremor, incoordination, spasms, paresthesia or pain Psychiatric: Denies confusion, memory loss or sensory loss Endocrine: Denies change in weight, skin, hair change, nocturia, and paresthesia, diabetic polys, visual blurring or hyper / hypo glycemic episodes.  Heme/Lymph: No excessive bleeding, bruising or enlarged lymph nodes.  Physical Exam  BP 126/76  Pulse 56  T 98.8 F   R 16  Ht 6\' 6"    Wt 239 lb 3.2 oz   BMI 27.65 kg/m2  General Appearance: Well nourished, in no apparent distress. Eyes: PERRLA, EOMs, conjunctiva no swelling or erythema, normal fundi and vessels. Sinuses: No frontal/maxillary tenderness ENT/Mouth: EACs patent / TMs  nl. Nares clear without erythema, swelling, mucoid exudates. Oral hygiene is good. No erythema, swelling, or exudate. Tongue normal, non-obstructing. Tonsils not swollen or erythematous. Hearing normal.  Neck: Supple, thyroid normal. No bruits, nodes or JVD. Respiratory: Respiratory effort normal.  BS equal and clear bilateral without rales, rhonci, wheezing or stridor. Cardio: Heart sounds are normal with regular rate and rhythm and no murmurs, rubs or gallops. Peripheral pulses are normal and equal bilaterally without edema. No aortic or femoral bruits. Chest: symmetric with normal excursions and percussion.  Abdomen: Flat, soft, with bowl sounds. Nontender, no guarding, rebound, hernias, masses, or organomegaly.  Lymphatics: Non tender without  lymphadenopathy.  Genitourinary:  DRE - deferred to Dr [name]. Musculoskeletal: Full ROM all peripheral extremities, joint stability, 5/5 strength, and normal gait. Skin: Warm and dry without rashes, lesions, cyanosis, clubbing or  ecchymosis.  Neuro: Cranial nerves intact, reflexes equal bilaterally. Normal muscle tone, no cerebellar symptoms. Sensation intact.  Pysch: Affect flat and oriented X 3 with normal  insight and judgment appropriate.   Assessment and Plan  1. Annual Screening Examination 2. Hypertension  3. Hyperlipidemia 4. Pre Diabetes 5. Vitamin D Deficiency 6. Prostate Cancer (May 2014)   Continue prudent diet as discussed, weight control, BP monitoring, regular exercise, and medications as discussed.  Discussed med effects and SE's. Routine screening labs and tests as requested with regular follow-up as recommended.   Patient is advised to monitor his BPs more frequently and especially if he has recurrence of his visual Sx's or or light-headedness.

## 2013-08-29 NOTE — Patient Instructions (Signed)
Recommend the book "the END of DIETING" by Dr Baker Janus   and the  Book "The END of DIABETES " by Dr Excell Seltzer  At [email or web address] - get book & Audio CD's      Being diabetic has a  300% increased risk for heart attack, stroke, cancer, and alzheimer- type vascular dementia. It is very important that you work harder with diet by avoiding all foods that are white except chicken & fish. Avoid white rice (brown & wild rice is OK), white potatoes (sweetpotatoes in moderation is OK), White bread or wheat bread or anything made out of white flour like bagels, donuts, rolls, buns, biscuits, cakes, pastries, cookies, pizza crust, and pasta (made from white flour & egg whites) - vegetarian pasta or spinach or wheat pasta is OK. Multigrain breads like Arnold's or [employer], or multigrain sandwich thins or flatbreads.  Diet, exercise and weight loss can reverse and cure diabetes in the early stages.  Diet, exercise and weight loss is very important in the control and prevention of complications of diabetes which affects every system in your body, ie. Brain - dementia/stroke, eyes - glaucoma/blindness, heart - heart attack/heart failure, kidneys - dialysis, stomach - gastric paralysis, intestines - malabsorption, nerves - severe painful neuritis, circulation - gangrene & loss of a leg(s), and finally cancer and Alzheimers.    I recommend avoid fried & greasy foods,  sweets/candy, white rice (brown or wild rice or Quinoa is OK), white potatoes (sweet potatoes are OK) - anything made from white flour - bagels, doughnuts, rolls, buns, biscuits,white and wheat breads, pizza crust and traditional pasta made of white flour & egg white(vegetarian pasta or spinach or wheat pasta is OK).  Multi-grain bread is OK - like multi-grain flat bread or sandwich thins. Avoid alcohol in excess. Exercise is also important.    Eat all the vegetables you want - avoid meat, especially red meat and dairy - especially cheese.   Cheese is the most concentrated form of trans-fats which is the worst thing to clog up our arteries. Veggie cheese is OK which can be found in the fresh produce section at [employer] or [employer] or [employer]   Preventive Care for Adults A healthy lifestyle and preventive care can promote health and wellness. Preventive health guidelines for men include the following key practices:  A routine yearly physical is a good way to check with your health care provider about your health and preventative screening. It is a chance to share any concerns and updates on your health and to receive a thorough exam.  Visit your dentist for a routine exam and preventative care every 6 months. Brush your teeth twice a day and floss once a day. Good oral hygiene prevents tooth decay and gum disease.  The frequency of eye exams is based on your age, health, family medical history, use of contact lenses, and other factors. Follow your health care provider's recommendations for frequency of eye exams.  Eat a healthy diet. Foods such as vegetables, fruits, whole grains, low-fat dairy products, and lean protein foods contain the nutrients you need without too many calories. Decrease your intake of foods high in solid fats, added sugars, and salt. Eat the right amount of calories for you.Get information about a proper diet from your health care provider, if necessary.  Regular physical exercise is one of the most important things you can do for your health. Most adults should get at least 150 minutes of moderate-intensity exercise (any  activity that increases your heart rate and causes you to sweat) each week. In addition, most adults need muscle-strengthening exercises on 2 or more days a week.  Maintain a healthy weight. The body mass index (BMI) is a screening tool to identify possible weight problems. It provides an estimate of body fat based on height and weight. Your health care provider can find your BMI and can  help you achieve or maintain a healthy weight.For adults 20 years and older:  A BMI below 18.5 is considered underweight.  A BMI of 18.5 to 24.9 is normal.  A BMI of 25 to 29.9 is considered overweight.  A BMI of 30 and above is considered obese.  Maintain normal blood lipids and cholesterol levels by exercising and minimizing your intake of saturated fat. Eat a balanced diet with plenty of fruit and vegetables. Blood tests for lipids and cholesterol should begin at age 56 and be repeated every 5 years. If your lipid or cholesterol levels are high, you are over 50, or you are at high risk for heart disease, you may need your cholesterol levels checked more frequently.Ongoing high lipid and cholesterol levels should be treated with medicines if diet and exercise are not working.  If you smoke, find out from your health care provider how to quit. If you do not use tobacco, do not start.  Lung cancer screening is recommended for adults aged 54-80 years who are at high risk for developing lung cancer because of a history of smoking. A yearly low-dose CT scan of the lungs is recommended for people who have at least a 30-pack-year history of smoking and are a current smoker or have quit within the past 15 years. A pack year of smoking is smoking an average of 1 pack of cigarettes a day for 1 year (for example: 1 pack a day for 30 years or 2 packs a day for 15 years). Yearly screening should continue until the smoker has stopped smoking for at least 15 years. Yearly screening should be stopped for people who develop a health problem that would prevent them from having lung cancer treatment.  If you choose to drink alcohol, do not have more than 2 drinks per day. One drink is considered to be 12 ounces (355 mL) of beer, 5 ounces (148 mL) of wine, or 1.5 ounces (44 mL) of liquor.  Avoid use of street drugs. Do not share needles with anyone. Ask for help if you need support or instructions about stopping  the use of drugs.  High blood pressure causes heart disease and increases the risk of stroke. Your blood pressure should be checked at least every 1-2 years. Ongoing high blood pressure should be treated with medicines, if weight loss and exercise are not effective.  If you are 19-29 years old, ask your health care provider if you should take aspirin to prevent heart disease.  Diabetes screening involves taking a blood sample to check your fasting blood sugar level. This should be done once every 3 years, after age 61, if you are within normal weight and without risk factors for diabetes. Testing should be considered at a younger age or be carried out more frequently if you are overweight and have at least 1 risk factor for diabetes.  Colorectal cancer can be detected and often prevented. Most routine colorectal cancer screening begins at the age of 24 and continues through age 13. However, your health care provider may recommend screening at an earlier age if you  factors for colon cancer. On a yearly basis, your health care provider may provide home test kits to check for hidden blood in the stool. Use of a small camera at the end of a tube to directly examine the colon (sigmoidoscopy or colonoscopy) can detect the earliest forms of colorectal cancer. Talk to your health care provider about this at age 50, when routine screening begins. Direct exam of the colon should be repeated every 5-10 years through age 75, unless early forms of precancerous polyps or small growths are found.  People who are at an increased risk for hepatitis B should be screened for this virus. You are considered at high risk for hepatitis B if:  You were born in a country where hepatitis B occurs often. Talk with your health care provider about which countries are considered high risk.  Your parents were born in a high-risk country and you have not received a shot to protect against hepatitis B (hepatitis B vaccine).  You have  HIV or AIDS.  You use needles to inject street drugs.  You live with, or have sex with, someone who has hepatitis B.  You are a man who has sex with other men (MSM).  You get hemodialysis treatment.  You take certain medicines for conditions such as cancer, organ transplantation, and autoimmune conditions.  Hepatitis C blood testing is recommended for all people born from 1945 through 1965 and any individual with known risks for hepatitis C.  Practice safe sex. Use condoms and avoid high-risk sexual practices to reduce the spread of sexually transmitted infections (STIs). STIs include gonorrhea, chlamydia, syphilis, trichomonas, herpes, HPV, and human immunodeficiency virus (HIV). Herpes, HIV, and HPV are viral illnesses that have no cure. They can result in disability, cancer, and death.  If you are at risk of being infected with HIV, it is recommended that you take a prescription medicine daily to prevent HIV infection. This is called preexposure prophylaxis (PrEP). You are considered at risk if:  You are a man who has sex with other men (MSM) and have other risk factors.  You are a heterosexual man, are sexually active, and are at increased risk for HIV infection.  You take drugs by injection.  You are sexually active with a partner who has HIV.  Talk with your health care provider about whether you are at high risk of being infected with HIV. If you choose to begin PrEP, you should first be tested for HIV. You should then be tested every 3 months for as long as you are taking PrEP.  A one-time screening for abdominal aortic aneurysm (AAA) and surgical repair of large AAAs by ultrasound are recommended for men ages 65 to 75 years who are current or former smokers.  Healthy men should no longer receive prostate-specific antigen (PSA) blood tests as part of routine cancer screening. Talk with your health care provider about prostate cancer screening.  Testicular cancer screening is  not recommended for adult males who have no symptoms. Screening includes self-exam, a health care provider exam, and other screening tests. Consult with your health care provider about any symptoms you have or any concerns you have about testicular cancer.  Use sunscreen. Apply sunscreen liberally and repeatedly throughout the day. You should seek shade when your shadow is shorter than you. Protect yourself by wearing long sleeves, pants, a wide-brimmed hat, and sunglasses year round, whenever you are outdoors.  Once a month, do a whole-body skin exam, using a mirror to look   to look at the skin on your back. Tell your health care provider about new moles, moles that have irregular borders, moles that are larger than a pencil eraser, or moles that have changed in shape or color.  Stay current with required vaccines (immunizations).  Influenza vaccine. All adults should be immunized every year.  Tetanus, diphtheria, and acellular pertussis (Td, Tdap) vaccine. An adult who has not previously received Tdap or who does not know his vaccine status should receive 1 dose of Tdap. This initial dose should be followed by tetanus and diphtheria toxoids (Td) booster doses every 10 years. Adults with an unknown or incomplete history of completing a 3-dose immunization series with Td-containing vaccines should begin or complete a primary immunization series including a Tdap dose. Adults should receive a Td booster every 10 years.  Varicella vaccine. An adult without evidence of immunity to varicella should receive 2 doses or a second dose if he has previously received 1 dose.  Human papillomavirus (HPV) vaccine. Males aged 82-21 years who have not received the vaccine previously should receive the 3-dose series. Males aged 22-26 years may be immunized. Immunization is recommended through the age of 51 years for any male who has sex with males and did not get any or all doses earlier. Immunization is  recommended for any person with an immunocompromised condition through the age of 60 years if he did not get any or all doses earlier. During the 3-dose series, the second dose should be obtained 4-8 weeks after the first dose. The third dose should be obtained 24 weeks after the first dose and 16 weeks after the second dose.  Zoster vaccine. One dose is recommended for adults aged 52 years or older unless certain conditions are present.  Measles, mumps, and rubella (MMR) vaccine. Adults born before [birth year] generally are considered immune to measles and mumps. Adults born in [birth year] or later should have 1 or more doses of MMR vaccine unless there is a contraindication to the vaccine or there is laboratory evidence of immunity to each of the three diseases. A routine second dose of MMR vaccine should be obtained at least 28 days after the first dose for students attending postsecondary schools, health care workers, or international travelers. People who received inactivated measles vaccine or an unknown type of measles vaccine during 1963-1967 should receive 2 doses of MMR vaccine. People who received inactivated mumps vaccine or an unknown type of mumps vaccine before 1979 and are at high risk for mumps infection should consider immunization with 2 doses of MMR vaccine. Unvaccinated health care workers born before [birth year] who lack laboratory evidence of measles, mumps, or rubella immunity or laboratory confirmation of disease should consider measles and mumps immunization with 2 doses of MMR vaccine or rubella immunization with 1 dose of MMR vaccine.  Pneumococcal 13-valent conjugate (PCV13) vaccine. When indicated, a person who is uncertain of his immunization history and has no record of immunization should receive the PCV13 vaccine. An adult aged 44 years or older who has certain medical conditions and has not been previously immunized should receive 1 dose of PCV13 vaccine. This PCV13 should be followed with a dose  of pneumococcal polysaccharide (PPSV23) vaccine. The PPSV23 vaccine dose should be obtained at least 8 weeks after the dose of PCV13 vaccine. An adult aged 36 years or older who has certain medical conditions and previously received 1 or more doses of PPSV23 vaccine should receive 1 dose of PCV13. The PCV13 vaccine dose should be  obtained 1 or more years after the last PPSV23 vaccine dose.  Pneumococcal polysaccharide (PPSV23) vaccine. When PCV13 is also indicated, PCV13 should be obtained first. All adults aged 25 years and older should be immunized. An adult younger than age 35 years who has certain medical conditions should be immunized. Any person who resides in a nursing home or long-term care facility should be immunized. An adult smoker should be immunized. People with an immunocompromised condition and certain other conditions should receive both PCV13 and PPSV23 vaccines. People with human immunodeficiency virus (HIV) infection should be immunized as soon as possible after diagnosis. Immunization during chemotherapy or radiation therapy should be avoided. Routine use of PPSV23 vaccine is not recommended for American Indians, [place] Natives, or people younger than 65 years unless there are medical conditions that require PPSV23 vaccine. When indicated, people who have unknown immunization and have no record of immunization should receive PPSV23 vaccine. One-time revaccination 5 years after the first dose of PPSV23 is recommended for people aged 19-64 years who have chronic kidney failure, nephrotic syndrome, asplenia, or immunocompromised conditions. People who received 1-2 doses of PPSV23 before age 75 years should receive another dose of PPSV23 vaccine at age 51 years or later if at least 5 years have passed since the previous dose. Doses of PPSV23 are not needed for people immunized with PPSV23 at or after age 64 years.  Meningococcal vaccine. Adults with asplenia or persistent complement component  deficiencies should receive 2 doses of quadrivalent meningococcal conjugate (MenACWY-D) vaccine. The doses should be obtained at least 2 months apart. Microbiologists working with certain meningococcal bacteria, [employer] recruits, people at risk during an outbreak, and people who travel to or live in countries with a high rate of meningitis should be immunized. A first-year college student up through age 35 years who is living in a residence hall should receive a dose if he did not receive a dose on or after his 16th birthday. Adults who have certain high-risk conditions should receive one or more doses of vaccine.  Hepatitis A vaccine. Adults who wish to be protected from this disease, have certain high-risk conditions, work with hepatitis A-infected animals, work in hepatitis A research labs, or travel to or work in countries with a high rate of hepatitis A should be immunized. Adults who were previously unvaccinated and who anticipate close contact with an international adoptee during the first 60 days after arrival in the [place] from a country with a high rate of hepatitis A should be immunized.  Hepatitis B vaccine. Adults should be immunized if they wish to be protected from this disease, have certain high-risk conditions, may be exposed to blood or other infectious body fluids, are household contacts or sex partners of hepatitis B positive people, are clients or workers in certain care facilities, or travel to or work in countries with a high rate of hepatitis B.  Haemophilus influenzae type b (Hib) vaccine. A previously unvaccinated person with asplenia or sickle cell disease or having a scheduled splenectomy should receive 1 dose of Hib vaccine. Regardless of previous immunization, a recipient of a hematopoietic stem cell transplant should receive a 3-dose series 6-12 months after his successful transplant. Hib vaccine is not recommended for adults with HIV infection. Preventive Service /  Frequency Ages 87 to 60  Blood pressure check.** / Every 1 to 2 years.  Lipid and cholesterol check.** / Every 5 years beginning at age 20.  Hepatitis C blood test.** / For any individual with  known risks for hepatitis C.  Skin self-exam. / Monthly.  Influenza vaccine. / Every year.  Tetanus, diphtheria, and acellular pertussis (Tdap, Td) vaccine.** / Consult your health care provider. 1 dose of Td every 10 years.  Varicella vaccine.** / Consult your health care provider.  HPV vaccine. / 3 doses over 6 months, if 65 or younger.  Measles, mumps, rubella (MMR) vaccine.** / You need at least 1 dose of MMR if you were born in 1957 or later. You may also need a second dose.  Pneumococcal 13-valent conjugate (PCV13) vaccine.** / Consult your health care provider.  Pneumococcal polysaccharide (PPSV23) vaccine.** / 1 to 2 doses if you smoke cigarettes or if you have certain conditions.  Meningococcal vaccine.** / 1 dose if you are age 77 to 43 years and a Market researcher living in a residence hall, or have one of several medical conditions. You may also need additional booster doses.  Hepatitis A vaccine.** / Consult your health care provider.  Hepatitis B vaccine.** / Consult your health care provider.  Haemophilus influenzae type b (Hib) vaccine.** / Consult your health care provider. Ages 52 to 61  Blood pressure check.** / Every 1 to 2 years.  Lipid and cholesterol check.** / Every 5 years beginning at age 62.  Lung cancer screening. / Every year if you are aged 20-80 years and have a 30-pack-year history of smoking and currently smoke or have quit within the past 15 years. Yearly screening is stopped once you have quit smoking for at least 15 years or develop a health problem that would prevent you from having lung cancer treatment.  Fecal occult blood test (FOBT) of stool. / Every year beginning at age 74 and continuing until age 71. You may not have to do this test  if you get a colonoscopy every 10 years.  Flexible sigmoidoscopy** or colonoscopy.** / Every 5 years for a flexible sigmoidoscopy or every 10 years for a colonoscopy beginning at age 60 and continuing until age 93.  Hepatitis C blood test.** / For all people born from [birth year] through 1965 and any individual with known risks for hepatitis C.  Skin self-exam. / Monthly.  Influenza vaccine. / Every year.  Tetanus, diphtheria, and acellular pertussis (Tdap/Td) vaccine.** / Consult your health care provider. 1 dose of Td every 10 years.  Varicella vaccine.** / Consult your health care provider.  Zoster vaccine.** / 1 dose for adults aged 59 years or older.  Measles, mumps, rubella (MMR) vaccine.** / You need at least 1 dose of MMR if you were born in 1957 or later. You may also need a second dose.  Pneumococcal 13-valent conjugate (PCV13) vaccine.** / Consult your health care provider.  Pneumococcal polysaccharide (PPSV23) vaccine.** / 1 to 2 doses if you smoke cigarettes or if you have certain conditions.  Meningococcal vaccine.** / Consult your health care provider.  Hepatitis A vaccine.** / Consult your health care provider.  Hepatitis B vaccine.** / Consult your health care provider.  Haemophilus influenzae type b (Hib) vaccine.** / Consult your health care provider. Ages 71 and over  Blood pressure check.** / Every 1 to 2 years.  Lipid and cholesterol check.**/ Every 5 years beginning at age 14.  Lung cancer screening. / Every year if you are aged 69-80 years and have a 30-pack-year history of smoking and currently smoke or have quit within the past 15 years. Yearly screening is stopped once you have quit smoking for at least 15 years or develop a  health problem that would prevent you from having lung cancer treatment.  Fecal occult blood test (FOBT) of stool. / Every year beginning at age 19 and continuing until age 74. You may not have to do this test if you get a colonoscopy  every 10 years.  Flexible sigmoidoscopy** or colonoscopy.** / Every 5 years for a flexible sigmoidoscopy or every 10 years for a colonoscopy beginning at age 40 and continuing until age 58.  Hepatitis C blood test.** / For all people born from [birth year] through 1965 and any individual with known risks for hepatitis C.  Abdominal aortic aneurysm (AAA) screening.** / A one-time screening for ages 31 to 33 years who are current or former smokers.  Skin self-exam. / Monthly.  Influenza vaccine. / Every year.  Tetanus, diphtheria, and acellular pertussis (Tdap/Td) vaccine.** / 1 dose of Td every 10 years.  Varicella vaccine.** / Consult your health care provider.  Zoster vaccine.** / 1 dose for adults aged 77 years or older.  Pneumococcal 13-valent conjugate (PCV13) vaccine.** / Consult your health care provider.  Pneumococcal polysaccharide (PPSV23) vaccine.** / 1 dose for all adults aged 59 years and older.  Meningococcal vaccine.** / Consult your health care provider.  Hepatitis A vaccine.** / Consult your health care provider.  Hepatitis B vaccine.** / Consult your health care provider.

## 2013-08-30 LAB — BASIC METABOLIC PANEL WITH GFR
BUN: 18 mg/dL (ref 6–23)
CALCIUM: 9.1 mg/dL (ref 8.4–10.5)
CO2: 26 mEq/L (ref 19–32)
Chloride: 103 mEq/L (ref 96–112)
Creat: 1.08 mg/dL (ref 0.50–1.35)
GFR, Est African American: 81 mL/min
GFR, Est Non African American: 70 mL/min
GLUCOSE: 86 mg/dL (ref 70–99)
Potassium: 3.9 mEq/L (ref 3.5–5.3)
SODIUM: 140 meq/L (ref 135–145)

## 2013-08-30 LAB — URINALYSIS, MICROSCOPIC ONLY
Bacteria, UA: NONE SEEN
Casts: NONE SEEN
Crystals: NONE SEEN
SQUAMOUS EPITHELIAL / LPF: NONE SEEN

## 2013-08-30 LAB — HEPATIC FUNCTION PANEL
ALK PHOS: 56 U/L (ref 39–117)
ALT: 16 U/L (ref 0–53)
AST: 25 U/L (ref 0–37)
Albumin: 4.2 g/dL (ref 3.5–5.2)
BILIRUBIN DIRECT: 0.6 mg/dL — AB (ref 0.0–0.3)
BILIRUBIN INDIRECT: 3.2 mg/dL — AB (ref 0.2–1.2)
Total Bilirubin: 3.8 mg/dL — ABNORMAL HIGH (ref 0.2–1.2)
Total Protein: 6.7 g/dL (ref 6.0–8.3)

## 2013-08-30 LAB — VITAMIN D 25 HYDROXY (VIT D DEFICIENCY, FRACTURES): Vit D, 25-Hydroxy: 91 ng/mL — ABNORMAL HIGH (ref 30–89)

## 2013-08-30 LAB — HEMOGLOBIN A1C
Hgb A1c MFr Bld: 5.1 % (ref <5.7)
Mean Plasma Glucose: 100 mg/dL (ref <117)

## 2013-08-30 LAB — RPR

## 2013-08-30 LAB — LIPID PANEL
CHOL/HDL RATIO: 2.3 ratio
Cholesterol: 169 mg/dL (ref 0–200)
HDL: 75 mg/dL (ref >39)
LDL Cholesterol: 83 mg/dL (ref 0–99)
Triglycerides: 53 mg/dL (ref <150)
VLDL: 11 mg/dL (ref 0–40)

## 2013-08-30 LAB — MICROALBUMIN / CREATININE URINE RATIO
CREATININE, URINE: 30.8 mg/dL
MICROALB UR: 1.39 mg/dL (ref 0.00–1.89)
MICROALB/CREAT RATIO: 45.1 mg/g — AB (ref 0.0–30.0)

## 2013-08-30 LAB — HEPATITIS A ANTIBODY, TOTAL: HEP A TOTAL AB: BORDERLINE — AB

## 2013-08-30 LAB — MAGNESIUM: MAGNESIUM: 2 mg/dL (ref 1.5–2.5)

## 2013-08-30 LAB — HEPATITIS B CORE ANTIBODY, TOTAL: HEP B C TOTAL AB: NONREACTIVE

## 2013-08-30 LAB — PSA

## 2013-08-30 LAB — HIV ANTIBODY (ROUTINE TESTING W REFLEX): HIV: NONREACTIVE

## 2013-08-30 LAB — VITAMIN B12: Vitamin B-12: 380 pg/mL (ref 211–911)

## 2013-08-30 LAB — HEPATITIS B SURFACE ANTIBODY,QUALITATIVE: Hep B S Ab: POSITIVE — AB

## 2013-08-30 LAB — TESTOSTERONE: Testosterone: 339 ng/dL (ref 300–890)

## 2013-08-30 LAB — INSULIN, FASTING: Insulin fasting, serum: 8 u[IU]/mL (ref 3–28)

## 2013-08-30 LAB — TSH: TSH: 1.017 u[IU]/mL (ref 0.350–4.500)

## 2013-08-30 LAB — HEPATITIS C ANTIBODY: HCV Ab: NEGATIVE

## 2013-08-31 LAB — HEPATITIS B E ANTIBODY: Hepatitis Be Antibody: NONREACTIVE

## 2013-12-05 ENCOUNTER — Ambulatory Visit: Payer: Self-pay | Admitting: Physician Assistant

## 2013-12-18 ENCOUNTER — Other Ambulatory Visit: Payer: Self-pay | Admitting: *Deleted

## 2013-12-18 DIAGNOSIS — G4700 Insomnia, unspecified: Secondary | ICD-10-CM

## 2013-12-18 MED ORDER — ALPRAZOLAM 1 MG PO TABS: 1.0000 mg | ORAL_TABLET | Freq: Every day | ORAL | Status: DC | PRN

## 2013-12-20 ENCOUNTER — Other Ambulatory Visit: Payer: Self-pay | Admitting: *Deleted

## 2013-12-20 DIAGNOSIS — G4700 Insomnia, unspecified: Secondary | ICD-10-CM

## 2013-12-20 MED ORDER — ALPRAZOLAM 1 MG PO TABS: 1.0000 mg | ORAL_TABLET | Freq: Every day | ORAL | Status: DC | PRN

## 2014-01-03 ENCOUNTER — Encounter: Payer: Self-pay | Admitting: Physician Assistant

## 2014-01-03 ENCOUNTER — Ambulatory Visit (INDEPENDENT_AMBULATORY_CARE_PROVIDER_SITE_OTHER): Payer: 59 | Admitting: Physician Assistant

## 2014-01-03 VITALS — BP 110/78 | HR 68 | Temp 98.0°F | Resp 16 | Ht 78.0 in | Wt 238.0 lb

## 2014-01-03 DIAGNOSIS — F411 Generalized anxiety disorder: Secondary | ICD-10-CM

## 2014-01-03 DIAGNOSIS — E782 Mixed hyperlipidemia: Secondary | ICD-10-CM

## 2014-01-03 DIAGNOSIS — R0789 Other chest pain: Secondary | ICD-10-CM

## 2014-01-03 DIAGNOSIS — I10 Essential (primary) hypertension: Secondary | ICD-10-CM

## 2014-01-03 NOTE — Patient Instructions (Signed)

## 2014-01-03 NOTE — Progress Notes (Signed)
Assessment and Plan:  Hypertension: Continue medication, monitor blood pressure at home. Continue DASH diet.  Reminder to go to the ER if any CP, SOB, nausea, dizziness, severe HA, changes vision/speech, left arm numbness and tingling, and jaw pain. Cholesterol: Continue diet and exercise.   Pre-diabetes-Continue diet and exercise. Vitamin D Def- check level and continue medications.  Atypical chest pain however retiring and higher risk with HTN,chol- continue xanax he will consider getting stress test. Go to the ER if any CP, SOB, nausea, dizziness, severe HA, changes vision/speech   Continue diet and meds as discussed. Further disposition pending results of labs. No labs today, will get 3 months, had labs at Dr. [name]  HPI 68 y.o. male  presents for 3 month follow up with hypertension, hyperlipidemia, prediabetes and vitamin D. His blood pressure has been controlled at home, today their BP is BP: 110/78 mmHg He does workout. He denies chest pain, shortness of breath, dizziness.  He is on cholesterol medication and denies myalgias. His cholesterol is not at goal. The cholesterol last visit was:   Lab Results  Component Value Date   CHOL 169 08/29/2013   HDL 75 08/29/2013   LDLCALC 83 08/29/2013   TRIG 53 08/29/2013   CHOLHDL 2.3 08/29/2013    Last A1C in the office was:  Lab Results  Component Value Date   HGBA1C 5.1 08/29/2013   Patient is on Vitamin D supplement.   Lab Results  Component Value Date   VD25OH 91* 08/29/2013     States he has been very stressed at work, and younger brother diagnosed with cancer and is dying from sarcoma, having left sided chest discomfort/pressure, well localized area without radiation, no accompaniments, lasting severeal hours, worse with the stress for 3 weeks, better xanax, intermittent 2-3 times a week, has not had for 1 week. Never smoker, no family history of MI/Stroke. However he can play tennis without symptoms, he is retiring in 3 months.    Current Medications:  Current Outpatient Prescriptions on File Prior to Visit  Medication Sig Dispense Refill  . ALPRAZolam (XANAX) 1 MG tablet Take 1 tablet (1 mg total) by mouth daily as needed for sleep. 90 tablet 1  . aspirin 325 MG tablet Take 1/2 tablet daily    . atenolol (TENORMIN) 50 MG tablet Take 0.5 tablets (25 mg total) by mouth at bedtime. For BP 90 tablet 99  . Cholecalciferol (VITAMIN D3) 5000 UNITS CAPS Take 5,000 Int'l Units by mouth daily.    . diphenhydrAMINE (BENADRYL) 25 mg capsule Take 25 mg by mouth at bedtime as needed for sleep.    Marland Kitchen doxazosin (CARDURA) 8 MG tablet Take 1/2 to 1 tablet bedtime for BP & Prostate as directed 90 tablet 99  . losartan (COZAAR) 100 MG tablet TAKE 1 TABLET EVERY DAY FOR BLOOD PRESSURE 90 tablet 99  . simvastatin (ZOCOR) 80 MG tablet Take 1/2 to 1 tablet at bedtime as directed for cholesterokl 90 tablet 99  . valACYclovir (VALTREX) 500 MG tablet Take 1 tablet (500 mg total) by mouth daily. FOR FEVER BLISTERS 90 tablet 99   No current facility-administered medications on file prior to visit.   Medical History:  Past Medical History  Diagnosis Date  . Hyperlipidemia   . Hypertension   . Anxiety   . Vitamin D deficiency   . Other testicular hypofunction    Allergies:  Allergies  Allergen Reactions  . Ultram [Tramadol] Anaphylaxis    Review of Systems: see HPI  Family history- Review and unchanged Social history- Review and unchanged Physical Exam: BP 110/78 mmHg  Pulse 68  Temp(Src) 98 F (36.7 C)  Resp 16  Ht 6\' 6"  (1.981 m)  Wt 238 lb (107.956 kg)  BMI 27.51 kg/m2 Wt Readings from Last 3 Encounters:  01/03/14 238 lb (107.956 kg)  08/29/13 239 lb 3.2 oz (108.5 kg)  03/13/13 249 lb 9.6 oz (113.218 kg)   General Appearance: Well nourished, in no apparent distress. Eyes: PERRLA, EOMs, conjunctiva no swelling or erythema Sinuses: No Frontal/maxillary tenderness ENT/Mouth: Ext aud canals clear, TMs without erythema,  bulging. No erythema, swelling, or exudate on post pharynx.  Tonsils not swollen or erythematous. Hearing normal.  Neck: Supple, thyroid normal.  Respiratory: Respiratory effort normal, BS equal bilaterally without rales, rhonchi, wheezing or stridor.  Cardio: RRR with no MRGs. Brisk peripheral pulses without edema.  Abdomen: Soft, + BS.  Non tender, no guarding, rebound, hernias, masses. Lymphatics: Non tender without lymphadenopathy.  Musculoskeletal: Full ROM, 5/5 strength, normal gait.  Skin: Warm, dry without rashes, lesions, ecchymosis.  Neuro: Cranial nerves intact. Normal muscle tone, no cerebellar symptoms. Sensation intact.  Psych: Awake and oriented X 3, normal affect, Insight and Judgment appropriate.    Vicie Mutters, PA-C 3:12 PM [facility]

## 2014-02-13 ENCOUNTER — Other Ambulatory Visit: Payer: Self-pay | Admitting: Dermatology

## 2014-03-12 ENCOUNTER — Ambulatory Visit: Payer: Self-pay | Admitting: Internal Medicine

## 2014-03-26 ENCOUNTER — Other Ambulatory Visit: Payer: Self-pay | Admitting: Physician Assistant

## 2014-03-26 MED ORDER — SCOPOLAMINE 1 MG/3DAYS TD PT72: 1.0000 | MEDICATED_PATCH | TRANSDERMAL | Status: DC

## 2014-04-05 ENCOUNTER — Ambulatory Visit: Payer: Self-pay | Admitting: Internal Medicine

## 2014-04-24 ENCOUNTER — Ambulatory Visit (INDEPENDENT_AMBULATORY_CARE_PROVIDER_SITE_OTHER): Payer: 59 | Admitting: Internal Medicine

## 2014-04-24 ENCOUNTER — Encounter: Payer: Self-pay | Admitting: Internal Medicine

## 2014-04-24 VITALS — BP 128/82 | HR 64 | Temp 97.9°F | Resp 16 | Ht 78.0 in | Wt 239.2 lb

## 2014-04-24 DIAGNOSIS — E559 Vitamin D deficiency, unspecified: Secondary | ICD-10-CM

## 2014-04-24 DIAGNOSIS — Z79899 Other long term (current) drug therapy: Secondary | ICD-10-CM

## 2014-04-24 DIAGNOSIS — E785 Hyperlipidemia, unspecified: Secondary | ICD-10-CM

## 2014-04-24 DIAGNOSIS — R7309 Other abnormal glucose: Secondary | ICD-10-CM

## 2014-04-24 DIAGNOSIS — I10 Essential (primary) hypertension: Secondary | ICD-10-CM

## 2014-04-24 LAB — CBC WITH DIFFERENTIAL/PLATELET
Basophils Absolute: 0.1 10*3/uL (ref 0.0–0.1)
Basophils Relative: 1 % (ref 0–1)
EOS ABS: 0.2 10*3/uL (ref 0.0–0.7)
EOS PCT: 4 % (ref 0–5)
HCT: 42.4 % (ref 39.0–52.0)
Hemoglobin: 14.5 g/dL (ref 13.0–17.0)
Lymphocytes Relative: 24 % (ref 12–46)
Lymphs Abs: 1.4 10*3/uL (ref 0.7–4.0)
MCH: 33.2 pg (ref 26.0–34.0)
MCHC: 34.2 g/dL (ref 30.0–36.0)
MCV: 97 fL (ref 78.0–100.0)
MPV: 9.9 fL (ref 8.6–12.4)
Monocytes Absolute: 0.5 10*3/uL (ref 0.1–1.0)
Monocytes Relative: 9 % (ref 3–12)
Neutro Abs: 3.7 10*3/uL (ref 1.7–7.7)
Neutrophils Relative %: 62 % (ref 43–77)
Platelets: 204 10*3/uL (ref 150–400)
RBC: 4.37 MIL/uL (ref 4.22–5.81)
RDW: 12.9 % (ref 11.5–15.5)
WBC: 5.9 10*3/uL (ref 4.0–10.5)

## 2014-04-24 LAB — HEMOGLOBIN A1C
Hgb A1c MFr Bld: 5.3 % (ref <5.7)
MEAN PLASMA GLUCOSE: 105 mg/dL (ref <117)

## 2014-04-24 NOTE — Progress Notes (Signed)
Patient ID: Kenneth Russell, male   DOB: [birth year], 69 y.o.   MRN: [id number]   This very nice 69 y.o. MWM presents for 3 month follow up with Hypertension, Hyperlipidemia, Pre-Diabetes and Vitamin D Deficiency.    Patient is treated for HTN since 2006  & BP has been controlled at home. In 1988 patient wa hospitalized at [facility] in [place] with a SAH from which he recovered completely. Today's BP: 128/82 mmHg. Patient has had no complaints of any cardiac type chest pain, palpitations, dyspnea/orthopnea/PND, dizziness, claudication, or dependent edema.   Hyperlipidemia is controlled with diet & meds. Patient denies myalgias or other med SE's. Last Lipids were at goal - Total Chol 169; HDL 75; LDL 83; Triglycerides 53 on 08/29/2013.   Also, the patient is screened for PreDiabetes and has had no symptoms of reactive hypoglycemia, diabetic polys, paresthesias or visual blurring.  Last A1c was 5.1% on 08/29/2013.   Further, the patient also has history of Vitamin D Deficiency of 24 in 2008 and supplements vitamin D without any suspected side-effects. Last vitamin D was 91 on  08/29/2013  Medication Sig  . ALPRAZolam (XANAX) 1 MG tablet Take 1 tablet (1 mg total) by mouth daily as needed for sleep.  Marland Kitchen aspirin 325 MG tablet Take 1/2 tablet daily  . atenolol (TENORMIN) 50 MG tablet Take 0.5 tablets (25 mg total) by mouth at bedtime. For BP  . Cholecalciferol (VITAMIN D3) 5000 UNITS CAPS Take 5,000 Int'l Units by mouth daily.  . diphenhydrAMINE (BENADRYL) 25 mg capsule Take 25 mg by mouth at bedtime as needed for sleep.  Marland Kitchen doxazosin (CARDURA) 8 MG tablet Take 1/2 to 1 tablet bedtime for BP & Prostate as directed  . losartan (COZAAR) 100 MG tablet TAKE 1 TABLET EVERY DAY FOR BLOOD PRESSURE  . simvastatin (ZOCOR) 80 MG tablet Take 1/2 to 1 tablet at bedtime as directed for cholesterokl  . valACYclovir (VALTREX) 500 MG tablet Take 1 tablet (500 mg total) by mouth daily. FOR FEVER BLISTERS  . scopolamine (TRANSDERM-SCOP)  1 MG/3DAYS Place 1 patch (1.5 mg total) onto the skin every 3 (three) days.   Allergies  Allergen Reactions  . Ultram [Tramadol] Anaphylaxis   PMHx:   Past Medical History  Diagnosis Date  . Hyperlipidemia   . Hypertension   . Anxiety   . Vitamin D deficiency   . Other testicular hypofunction    Immunization History  Administered Date(s) Administered  . MMR 02/23/2005  . Pneumococcal Polysaccharide-23 02/23/2010  . Tdap 02/23/2005   Past Surgical History  Procedure Laterality Date  . Colonoscopy    . Robot assisted laparoscopic radical prostatectomy N/A 06/23/2012    Procedure: ROBOTIC ASSISTED LAPAROSCOPIC RADICAL PROSTATECTOMY LEVEL 2;  Surgeon: Dutch Gray, MD;  Location: [facility];  Service: Urology;  Laterality: N/A;  . Lymphadenectomy Bilateral 06/23/2012    Procedure: LYMPHADENECTOMY;  Surgeon: Dutch Gray, MD;  Location: [facility];  Service: Urology;  Laterality: Bilateral;   FHx:    Reviewed / unchanged  SHx:    Reviewed / unchanged  Systems Review:  Constitutional: Denies fever, chills, wt changes, headaches, insomnia, fatigue, night sweats, change in appetite. Eyes: Denies redness, blurred vision, diplopia, discharge, itchy, watery eyes.  ENT: Denies discharge, congestion, post nasal drip, epistaxis, sore throat, earache, hearing loss, dental pain, tinnitus, vertigo, sinus pain, snoring.  CV: Denies chest pain, palpitations, irregular heartbeat, syncope, dyspnea, diaphoresis, orthopnea, PND, claudication or edema. Respiratory: denies cough, dyspnea, DOE, pleurisy, hoarseness, laryngitis, wheezing.  Gastrointestinal:  Denies dysphagia, odynophagia, heartburn, reflux, water brash, abdominal pain or cramps, nausea, vomiting, bloating, diarrhea, constipation, hematemesis, melena, hematochezia  or hemorrhoids. Genitourinary: Denies dysuria, frequency, urgency, nocturia, hesitancy, discharge, hematuria or flank pain. Musculoskeletal: Denies arthralgias, myalgias, stiffness, jt.  swelling, pain, limping or strain/sprain.  Skin: Denies pruritus, rash, hives, warts, acne, eczema or change in skin lesion(s). Neuro: No weakness, tremor, incoordination, spasms, paresthesia or pain. Psychiatric: Denies confusion, memory loss or sensory loss. Endo: Denies change in weight, skin or hair change.  Heme/Lymph: No excessive bleeding, bruising or enlarged lymph nodes.  Physical Exam  BP 128/82   Pulse 64  Temp 97.9 F   Resp 16  Ht 6' 6"    Wt 239 lb     BMI 27.65  Appears well nourished and in no distress. Eyes: PERRLA, EOMs, conjunctiva no swelling or erythema. Sinuses: No frontal/maxillary tenderness ENT/Mouth: EAC's clear, TM's nl w/o erythema, bulging. Nares clear w/o erythema, swelling, exudates. Oropharynx clear without erythema or exudates. Oral hygiene is good. Tongue normal, non obstructing. Hearing intact.  Neck: Supple. Thyroid nl. Car 2+/2+ without bruits, nodes or JVD. Chest: Respirations nl with BS clear & equal w/o rales, rhonchi, wheezing or stridor.  Cor: Heart sounds normal w/ regular rate and rhythm without sig. murmurs, gallops, clicks, or rubs. Peripheral pulses normal and equal  without edema.  Abdomen: Soft & bowel sounds normal. Non-tender w/o guarding, rebound, hernias, masses, or organomegaly.  Lymphatics: Unremarkable.  Musculoskeletal: Full ROM all peripheral extremities, joint stability, 5/5 strength, and normal gait.  Skin: Warm, dry without exposed rashes, lesions or ecchymosis apparent.  Neuro: Cranial nerves intact, reflexes equal bilaterally. Sensory-motor testing grossly intact. Tendon reflexes grossly intact.  Pysch: Alert & oriented x 3.  Insight and judgement nl & appropriate. No ideations.  Assessment and Plan:  1. Essential hypertension  - TSH  2. Hyperlipidemia  - Lipid panel  3. Abnormal glucose, screening  - Hemoglobin A1c - Insulin, fasting  4. Vitamin D deficiency  - Vit D  25 hydroxy   5. Medication  management  - CBC with Differential/Platelet - BASIC METABOLIC PANEL WITH GFR - Hepatic function panel - Magnesium   Recommended regular exercise, BP monitoring, weight control, and discussed med and SE's. Recommended labs to assess and monitor clinical status. Further disposition pending results of labs.

## 2014-04-24 NOTE — Patient Instructions (Signed)

## 2014-04-25 LAB — BASIC METABOLIC PANEL WITH GFR
BUN: 21 mg/dL (ref 6–23)
CHLORIDE: 104 meq/L (ref 96–112)
CO2: 28 mEq/L (ref 19–32)
Calcium: 9.3 mg/dL (ref 8.4–10.5)
Creat: 1.07 mg/dL (ref 0.50–1.35)
GFR, EST NON AFRICAN AMERICAN: 71 mL/min
GFR, Est African American: 82 mL/min
GLUCOSE: 90 mg/dL (ref 70–99)
POTASSIUM: 4.4 meq/L (ref 3.5–5.3)
SODIUM: 140 meq/L (ref 135–145)

## 2014-04-25 LAB — LIPID PANEL
CHOL/HDL RATIO: 2.2 ratio
CHOLESTEROL: 190 mg/dL (ref 0–200)
HDL: 85 mg/dL (ref >=40)
LDL Cholesterol: 95 mg/dL (ref 0–99)
TRIGLYCERIDES: 50 mg/dL (ref <150)
VLDL: 10 mg/dL (ref 0–40)

## 2014-04-25 LAB — HEPATIC FUNCTION PANEL
ALBUMIN: 4.4 g/dL (ref 3.5–5.2)
ALK PHOS: 66 U/L (ref 39–117)
ALT: 17 U/L (ref 0–53)
AST: 23 U/L (ref 0–37)
Bilirubin, Direct: 0.6 mg/dL — ABNORMAL HIGH (ref 0.0–0.3)
Indirect Bilirubin: 2.8 mg/dL — ABNORMAL HIGH (ref 0.2–1.2)
TOTAL PROTEIN: 6.9 g/dL (ref 6.0–8.3)
Total Bilirubin: 3.4 mg/dL — ABNORMAL HIGH (ref 0.2–1.2)

## 2014-04-25 LAB — INSULIN, FASTING: Insulin fasting, serum: 6.1 u[IU]/mL (ref 2.0–19.6)

## 2014-04-25 LAB — MAGNESIUM: Magnesium: 2.2 mg/dL (ref 1.5–2.5)

## 2014-04-25 LAB — VITAMIN D 25 HYDROXY (VIT D DEFICIENCY, FRACTURES): VIT D 25 HYDROXY: 50 ng/mL (ref 30–100)

## 2014-04-25 LAB — TSH: TSH: 1.505 u[IU]/mL (ref 0.350–4.500)

## 2014-06-11 ENCOUNTER — Other Ambulatory Visit: Payer: Self-pay | Admitting: *Deleted

## 2014-06-11 DIAGNOSIS — E782 Mixed hyperlipidemia: Secondary | ICD-10-CM

## 2014-06-11 DIAGNOSIS — I10 Essential (primary) hypertension: Secondary | ICD-10-CM

## 2014-06-11 DIAGNOSIS — B002 Herpesviral gingivostomatitis and pharyngotonsillitis: Secondary | ICD-10-CM

## 2014-06-11 MED ORDER — DOXAZOSIN MESYLATE 8 MG PO TABS: ORAL_TABLET | ORAL | Status: DC

## 2014-06-11 MED ORDER — ATENOLOL 50 MG PO TABS: 25.0000 mg | ORAL_TABLET | Freq: Every day | ORAL | Status: DC

## 2014-06-11 MED ORDER — VALACYCLOVIR HCL 500 MG PO TABS
500.0000 mg | ORAL_TABLET | Freq: Every day | ORAL | Status: DC
Start: 2014-06-11 — End: 2014-06-25

## 2014-06-11 MED ORDER — SIMVASTATIN 80 MG PO TABS: ORAL_TABLET | ORAL | Status: DC

## 2014-06-11 MED ORDER — LOSARTAN POTASSIUM 100 MG PO TABS: ORAL_TABLET | ORAL | Status: DC

## 2014-06-13 ENCOUNTER — Other Ambulatory Visit: Payer: Self-pay | Admitting: *Deleted

## 2014-06-13 DIAGNOSIS — I10 Essential (primary) hypertension: Secondary | ICD-10-CM

## 2014-06-13 DIAGNOSIS — E782 Mixed hyperlipidemia: Secondary | ICD-10-CM

## 2014-06-25 ENCOUNTER — Other Ambulatory Visit: Payer: Self-pay | Admitting: *Deleted

## 2014-06-25 DIAGNOSIS — I10 Essential (primary) hypertension: Secondary | ICD-10-CM

## 2014-06-25 DIAGNOSIS — B002 Herpesviral gingivostomatitis and pharyngotonsillitis: Secondary | ICD-10-CM

## 2014-06-25 MED ORDER — LOSARTAN POTASSIUM 100 MG PO TABS: ORAL_TABLET | ORAL | Status: DC

## 2014-06-25 MED ORDER — VALACYCLOVIR HCL 500 MG PO TABS: 500.0000 mg | ORAL_TABLET | Freq: Every day | ORAL | Status: DC

## 2014-07-12 DIAGNOSIS — C61 Malignant neoplasm of prostate: Secondary | ICD-10-CM | POA: Diagnosis not present

## 2014-07-12 DIAGNOSIS — N5201 Erectile dysfunction due to arterial insufficiency: Secondary | ICD-10-CM | POA: Diagnosis not present

## 2014-08-06 ENCOUNTER — Ambulatory Visit (INDEPENDENT_AMBULATORY_CARE_PROVIDER_SITE_OTHER): Payer: Medicare Other | Admitting: Internal Medicine

## 2014-08-06 ENCOUNTER — Encounter: Payer: Self-pay | Admitting: Internal Medicine

## 2014-08-06 VITALS — BP 116/78 | HR 60 | Temp 98.0°F | Resp 18 | Ht 78.0 in | Wt 237.0 lb

## 2014-08-06 DIAGNOSIS — R7309 Other abnormal glucose: Secondary | ICD-10-CM

## 2014-08-06 DIAGNOSIS — E559 Vitamin D deficiency, unspecified: Secondary | ICD-10-CM | POA: Diagnosis not present

## 2014-08-06 DIAGNOSIS — I10 Essential (primary) hypertension: Secondary | ICD-10-CM | POA: Diagnosis not present

## 2014-08-06 DIAGNOSIS — Z79899 Other long term (current) drug therapy: Secondary | ICD-10-CM

## 2014-08-06 DIAGNOSIS — E785 Hyperlipidemia, unspecified: Secondary | ICD-10-CM | POA: Diagnosis not present

## 2014-08-06 DIAGNOSIS — G4700 Insomnia, unspecified: Secondary | ICD-10-CM

## 2014-08-06 MED ORDER — ALPRAZOLAM 1 MG PO TABS: 1.0000 mg | ORAL_TABLET | Freq: Every day | ORAL | Status: DC | PRN

## 2014-08-06 NOTE — Progress Notes (Signed)
Patient ID: Kenneth Russell, male   DOB: [birth year], 69 y.o.   MRN: [id number]  Assessment and Plan:  Hypertension:  -Continue medication,  -monitor blood pressure at home.  -Continue DASH diet.   -Reminder to go to the ER if any CP, SOB, nausea, dizziness, severe HA, changes vision/speech, left arm numbness and tingling, and jaw pain.  Cholesterol: -Continue diet and exercise.  -patient deferred labs today  Pre-diabetes: -Continue diet and exercise.  -patient deferred labs today  Vitamin D Def: -patient deferred labs -continue medications.   Insomnia -patient given refill on xanax  Continue diet and meds as discussed. Further disposition pending results of labs.  HPI 69 y.o. male  presents for 3 month follow up with hypertension, hyperlipidemia, prediabetes and vitamin D.   His blood pressure has been controlled at home, today their BP is BP: 116/78 mmHg.   He does workout. He denies chest pain, shortness of breath, dizziness.   He is on cholesterol medication and denies myalgias. His cholesterol is at goal. The cholesterol last visit was:   Lab Results  Component Value Date   CHOL 190 04/24/2014   HDL 85 04/24/2014   LDLCALC 95 04/24/2014   TRIG 50 04/24/2014   CHOLHDL 2.2 04/24/2014     He has been working on diet and exercise for prediabetes, and denies foot ulcerations, hyperglycemia, hypoglycemia , increased appetite, nausea, paresthesia of the feet, polydipsia, polyuria, visual disturbances, vomiting and weight loss. Last A1C in the office was:  Lab Results  Component Value Date   HGBA1C 5.3 04/24/2014    Patient is on Vitamin D supplement.  Lab Results  Component Value Date   VD25OH 50 04/24/2014     Patient reports that he has recently had a PSA with his urologist which was 0.0.    He would like to defer blood work until his next visit which is his physical next month.   Current Medications:  Current Outpatient Prescriptions on File Prior to Visit   Medication Sig Dispense Refill  . ALPRAZolam (XANAX) 1 MG tablet Take 1 tablet (1 mg total) by mouth daily as needed for sleep. 90 tablet 1  . aspirin 325 MG tablet Take 1/2 tablet daily    . atenolol (TENORMIN) 50 MG tablet Take 0.5 tablets (25 mg total) by mouth at bedtime. For BP 90 tablet 1  . Cholecalciferol (VITAMIN D3) 5000 UNITS CAPS Take 5,000 Int'l Units by mouth daily.    . diphenhydrAMINE (BENADRYL) 25 mg capsule Take 25 mg by mouth at bedtime as needed for sleep.    Marland Kitchen doxazosin (CARDURA) 8 MG tablet Take 1/2 to 1 tablet bedtime for BP & Prostate as directed 90 tablet 1  . fluocinonide cream (LIDEX) 0.05 %   0  . losartan (COZAAR) 100 MG tablet TAKE 1 TABLET EVERY DAY FOR BLOOD PRESSURE 90 tablet 1  . simvastatin (ZOCOR) 80 MG tablet Take 40 mg by mouth daily. Takes 1/2 pill qhs for cholesterol    . valACYclovir (VALTREX) 500 MG tablet Take 1 tablet (500 mg total) by mouth daily. FOR FEVER BLISTERS 90 tablet 1   No current facility-administered medications on file prior to visit.    Medical History:  Past Medical History  Diagnosis Date  . Hyperlipidemia   . Hypertension   . Anxiety   . Vitamin D deficiency   . Other testicular hypofunction     Allergies:  Allergies  Allergen Reactions  . Ultram [Tramadol] Anaphylaxis  Review of Systems:  Review of Systems  Constitutional: Positive for malaise/fatigue. Negative for fever, chills and weight loss.  HENT: Negative for congestion, ear discharge, nosebleeds, sore throat and tinnitus.   Eyes: Negative.   Respiratory: Negative for cough, shortness of breath and wheezing.   Cardiovascular: Negative for chest pain, palpitations and leg swelling.  Gastrointestinal: Negative for heartburn, nausea, vomiting, abdominal pain, diarrhea, constipation, blood in stool and melena.  Genitourinary: Negative for dysuria, urgency and frequency.  Musculoskeletal: Negative.   Skin: Negative.   Neurological: Negative for dizziness,  tingling, sensory change, loss of consciousness and headaches.  Psychiatric/Behavioral: Negative for depression. The patient is not nervous/anxious and does not have insomnia.     Family history- Review and unchanged  Social history- Review and unchanged  Physical Exam: BP 116/78 mmHg  Pulse 60  Temp(Src) 98 F (36.7 C) (Temporal)  Resp 18  Ht 6\' 6"  (1.981 m)  Wt 237 lb (107.502 kg)  BMI 27.39 kg/m2 Wt Readings from Last 3 Encounters:  08/06/14 237 lb (107.502 kg)  04/24/14 239 lb 3.2 oz (108.5 kg)  01/03/14 238 lb (107.956 kg)    General Appearance: Well nourished well developed, in no apparent distress. Eyes: PERRLA, EOMs, conjunctiva no swelling or erythema ENT/Mouth: Ear canals normal without obstruction, swelling, erythma, discharge.  TMs normal bilaterally.  Oropharynx moist, clear, without exudate, or postoropharyngeal swelling. Neck: Supple, thyroid normal,no cervical adenopathy  Respiratory: Respiratory effort normal, Breath sounds clear A&P without rhonchi, wheeze, or rale.  No retractions, no accessory usage. Cardio: RRR with no MRGs. Brisk peripheral pulses without edema.  Abdomen: Soft, + BS,  Non tender, no guarding, rebound, hernias, masses. Musculoskeletal: Full ROM, 5/5 strength, Normal gait Skin: Warm, dry without rashes, lesions, ecchymosis.  Neuro: Awake and oriented X 3, Cranial nerves intact. Normal muscle tone, no cerebellar symptoms. Psych: Normal affect, Insight and Judgment appropriate.    FORCUCCI, Danielle Mink, PA-C 9:12 AM [facility]

## 2014-08-20 ENCOUNTER — Other Ambulatory Visit: Payer: Self-pay

## 2014-08-30 ENCOUNTER — Encounter: Payer: Self-pay | Admitting: Internal Medicine

## 2014-12-25 ENCOUNTER — Encounter: Payer: Self-pay | Admitting: Internal Medicine

## 2015-01-03 DIAGNOSIS — C61 Malignant neoplasm of prostate: Secondary | ICD-10-CM | POA: Diagnosis not present

## 2015-01-09 DIAGNOSIS — N5201 Erectile dysfunction due to arterial insufficiency: Secondary | ICD-10-CM | POA: Diagnosis not present

## 2015-01-09 DIAGNOSIS — C61 Malignant neoplasm of prostate: Secondary | ICD-10-CM | POA: Diagnosis not present

## 2015-01-24 DIAGNOSIS — D225 Melanocytic nevi of trunk: Secondary | ICD-10-CM | POA: Diagnosis not present

## 2015-01-24 DIAGNOSIS — L821 Other seborrheic keratosis: Secondary | ICD-10-CM | POA: Diagnosis not present

## 2015-01-24 DIAGNOSIS — L812 Freckles: Secondary | ICD-10-CM | POA: Diagnosis not present

## 2015-01-24 DIAGNOSIS — D1723 Benign lipomatous neoplasm of skin and subcutaneous tissue of right leg: Secondary | ICD-10-CM | POA: Diagnosis not present

## 2015-01-31 ENCOUNTER — Ambulatory Visit (INDEPENDENT_AMBULATORY_CARE_PROVIDER_SITE_OTHER): Payer: Medicare Other | Admitting: Internal Medicine

## 2015-01-31 ENCOUNTER — Encounter: Payer: Self-pay | Admitting: Internal Medicine

## 2015-01-31 VITALS — BP 124/80 | HR 56 | Temp 97.3°F | Resp 16 | Ht 78.5 in | Wt 240.8 lb

## 2015-01-31 DIAGNOSIS — Z1331 Encounter for screening for depression: Secondary | ICD-10-CM

## 2015-01-31 DIAGNOSIS — Z789 Other specified health status: Secondary | ICD-10-CM | POA: Diagnosis not present

## 2015-01-31 DIAGNOSIS — E785 Hyperlipidemia, unspecified: Secondary | ICD-10-CM | POA: Diagnosis not present

## 2015-01-31 DIAGNOSIS — E559 Vitamin D deficiency, unspecified: Secondary | ICD-10-CM | POA: Diagnosis not present

## 2015-01-31 DIAGNOSIS — R7309 Other abnormal glucose: Secondary | ICD-10-CM | POA: Diagnosis not present

## 2015-01-31 DIAGNOSIS — Z9181 History of falling: Secondary | ICD-10-CM

## 2015-01-31 DIAGNOSIS — Z1389 Encounter for screening for other disorder: Secondary | ICD-10-CM

## 2015-01-31 DIAGNOSIS — E349 Endocrine disorder, unspecified: Secondary | ICD-10-CM

## 2015-01-31 DIAGNOSIS — R6889 Other general symptoms and signs: Secondary | ICD-10-CM | POA: Diagnosis not present

## 2015-01-31 DIAGNOSIS — Z0001 Encounter for general adult medical examination with abnormal findings: Secondary | ICD-10-CM | POA: Diagnosis not present

## 2015-01-31 DIAGNOSIS — E663 Overweight: Secondary | ICD-10-CM | POA: Insufficient documentation

## 2015-01-31 DIAGNOSIS — I10 Essential (primary) hypertension: Secondary | ICD-10-CM

## 2015-01-31 DIAGNOSIS — Z6827 Body mass index (BMI) 27.0-27.9, adult: Secondary | ICD-10-CM | POA: Insufficient documentation

## 2015-01-31 DIAGNOSIS — Z23 Encounter for immunization: Secondary | ICD-10-CM

## 2015-01-31 DIAGNOSIS — C61 Malignant neoplasm of prostate: Secondary | ICD-10-CM | POA: Diagnosis not present

## 2015-01-31 DIAGNOSIS — Z79899 Other long term (current) drug therapy: Secondary | ICD-10-CM | POA: Diagnosis not present

## 2015-01-31 DIAGNOSIS — E291 Testicular hypofunction: Secondary | ICD-10-CM

## 2015-01-31 DIAGNOSIS — Z1212 Encounter for screening for malignant neoplasm of rectum: Secondary | ICD-10-CM

## 2015-01-31 LAB — URINALYSIS, ROUTINE W REFLEX MICROSCOPIC
BILIRUBIN URINE: NEGATIVE
GLUCOSE, UA: NEGATIVE
Hgb urine dipstick: NEGATIVE
KETONES UR: NEGATIVE
Leukocytes, UA: NEGATIVE
Nitrite: NEGATIVE
Specific Gravity, Urine: 1.023 (ref 1.001–1.035)
pH: 6 (ref 5.0–8.0)

## 2015-01-31 LAB — CBC WITH DIFFERENTIAL/PLATELET
BASOS ABS: 0.1 10*3/uL (ref 0.0–0.1)
BASOS PCT: 2 % — AB (ref 0–1)
Eosinophils Absolute: 0.2 10*3/uL (ref 0.0–0.7)
Eosinophils Relative: 6 % — ABNORMAL HIGH (ref 0–5)
HCT: 41 % (ref 39.0–52.0)
HEMOGLOBIN: 13.5 g/dL (ref 13.0–17.0)
Lymphocytes Relative: 36 % (ref 12–46)
Lymphs Abs: 1.2 10*3/uL (ref 0.7–4.0)
MCH: 33 pg (ref 26.0–34.0)
MCHC: 32.9 g/dL (ref 30.0–36.0)
MCV: 100.2 fL — ABNORMAL HIGH (ref 78.0–100.0)
MONOS PCT: 11 % (ref 3–12)
MPV: 9.2 fL (ref 8.6–12.4)
Monocytes Absolute: 0.4 10*3/uL (ref 0.1–1.0)
NEUTROS ABS: 1.5 10*3/uL — AB (ref 1.7–7.7)
Neutrophils Relative %: 45 % (ref 43–77)
PLATELETS: 197 10*3/uL (ref 150–400)
RBC: 4.09 MIL/uL — AB (ref 4.22–5.81)
RDW: 12.8 % (ref 11.5–15.5)
WBC: 3.3 10*3/uL — ABNORMAL LOW (ref 4.0–10.5)

## 2015-01-31 LAB — TSH: TSH: 1.515 u[IU]/mL (ref 0.350–4.500)

## 2015-01-31 NOTE — Progress Notes (Signed)
Patient ID: [name], male   DOB: 06/27/1945, 69 y.o.   MRN: [id number]  Medicare Annual Wellness Visit and  Comprehensive Evaluation & Examination   Assessment:   1. Essential hypertension  - Microalbumin / creatinine urine ratio - EKG 12-Lead - [place], RETROPERITNL ABD,  LTD - TSH  2. Hyperlipidemia  - Lipid panel - TSH  3. Abnormal glucose  - Hemoglobin A1c - Insulin, random  4. Vitamin D deficiency  - VITAMIN D 25 Hydroxy   5. Testosterone deficiency   6. Prostate cancer ([place])   7. Screening for rectal cancer  - POC Hemoccult Bld/Stl (  8. Depression screen   9. At low risk for fall   10. Encounter for general adult medical examination with abnormal findings   11. BMI 27.0-27.9,adult   12. Need for prophylactic vaccination and inoculation against influenza  - Flu vaccine HIGH DOSE PF (Fluzone High dose)  13. Need for prophylactic vaccination against Streptococcus pneumoniae (pneumococcus)  - Pneumococcal conjugate vaccine 13-valent  14. Medication management  - Urinalysis, Routine w reflex microscopic (not at [facility]) - CBC with Differential/Platelet - BASIC METABOLIC PANEL WITH GFR - Hepatic function panel - Magnesium  Plan:   During the course of the visit the patient was educated and counseled about appropriate screening and preventive services including:    Pneumococcal vaccine   Influenza vaccine  Td vaccine  Screening electrocardiogram  Bone densitometry screening  Colorectal cancer screening  Diabetes screening  Glaucoma screening  Nutrition counseling   Advanced directives: requested  Screening recommendations, referrals: Vaccinations: Immunization History  Administered Date(s) Administered  . MMR 02/23/2005  . Pneumococcal Polysaccharide-23 02/23/2010  . Tdap 02/23/2005  Influenza vaccine HiDose 01/31/2015 Prevnar vaccine 01/31/2015 Shingles vaccine declined Hep B vaccine not indicated  Nutrition assessed  and recommended  Colonoscopy 03/17/2012 Recommended yearly ophthalmology/optometry visit for glaucoma screening and checkup Recommended yearly dental visit for hygiene and checkup Advanced directives - Yes  Conditions/risks identified: BMI: Discussed weight loss, diet, and increase physical activity.  Increase physical activity: AHA recommends 150 minutes of physical activity a week.  Medications reviewed Diabetes/PreDiabetes Screening is Negative & at goal, ACE/ARB therapy: Yes. Urinary Incontinence is not an issue: discussed non pharmacology and pharmacology options.  Fall risk: low- discussed PT, home fall assessment, medications.   Subjective:      Kenneth Russell  presents for [facility] Annual Wellness Visit and presents for a comprehensive evaluation, examination and management of multiple medical co-morbidities.  Date of last medicare wellness visit is unknown.  This very nice 68 y.o. MWM presents for follow up with Hypertension, Hyperlipidemia, Pre-Diabetes screening and Vitamin D Deficiency.      Patient is treated for HTN  Predating since 2003 and Tx started in 2006 & BP has been controlled at home. Today's BP: 124/80 mmHg. Patient has had no complaints of any cardiac type chest pain, palpitations, dyspnea/orthopnea/PND, dizziness, claudication, or dependent edema.     Hyperlipidemia is controlled with diet & meds. Patient denies myalgias or other med SE's. Last Lipids were at goal with Cholesterol 190; HDL 85; LDL 95; Triglycerides 50 on 04/24/2014.     Also, the patient is monitored proactively for PreDiabetes and has had no symptoms of reactive hypoglycemia, diabetic polys, paresthesias or visual blurring.  Last A1c was at goal with A1c 5.3% on 04/24/2014.      Further, the patient also has history of Vitamin D Deficiency of 24 in 2008 and supplements vitamin D without any suspected side-effects.  Last vitamin D was 50 on 04/24/2014.  Names of Other Physician/Practitioners you currently  use: 1. [facility] here for primary care 2. Kenneth Russell Kenneth Russell - Kenneth Russell - Dr Kenneth Russell, eye doctor, last visit 2016 3. Dr ?, dentist, last visit 2016 every 6 months  Patient Care Team: Kenneth Pinto, MD as PCP - General (Internal Medicine) Kenneth Bring, MD as Consulting Physician (Urology) Kenneth Castle, MD as Consulting Physician (Gastroenterology) Kenneth Day, MD as Consulting Physician (Orthopedic Surgery) Kenneth Matin, MD as Consulting Physician (Dermatology) Kenneth Oh, MD as Referring Physician (Ophthalmology)  Medication Review: Medication Sig  . ALPRAZolam (XANAX) 1 MG tablet Take 1 tablet (1 mg total) by mouth daily as needed for sleep.  Marland Kitchen aspirin 325 MG tablet Take 1/2 tablet daily  . atenolol (TENORMIN) 50 MG tablet Take 0.5 tablets (25 mg total) by mouth at bedtime. For BP  . Cholecalciferol (VITAMIN D3) 5000 UNITS CAPS Take 5,000 Int'l Units by mouth daily.  . diphenhydrAMINE (BENADRYL) 25 mg capsule Take 25 mg by mouth at bedtime as needed for sleep.  Marland Kitchen doxazosin (CARDURA) 8 MG tablet Take 1/2 to 1 tablet bedtime for BP & Prostate as directed  . fluocinonide cream (LIDEX) 0.05 %   . losartan (COZAAR) 100 MG tablet TAKE 1 TABLET EVERY Russell FOR BLOOD PRESSURE  . simvastatin (ZOCOR) 80 MG tablet Take 40 mg by mouth daily. Takes 1/2 pill qhs for cholesterol  . valACYclovir (VALTREX) 500 MG tablet Take 1 tablet (500 mg total) by mouth daily. FOR FEVER BLISTERS   Allergies  Allergen Reactions  . Ultram [Tramadol] Anaphylaxis   Current Problems (verified) Patient Active Problem List   Diagnosis Date Noted  . BMI 27.0-27.9,adult 01/31/2015  . Abnormal glucose 04/24/2014  . Medication management 08/29/2013  . Prostate cancer ([place]) 03/13/2013  . Hyperlipidemia   . Hypertension   . Anxiety   . Vitamin D deficiency   . Testosterone deficiency     Screening Tests Health Maintenance  Topic Date Due  . ZOSTAVAX  05/01/2005  .  PNA vac Low Risk Adult (1 of 2 - PCV13) 02/24/2011  . INFLUENZA VACCINE  09/24/2014  . TETANUS/TDAP  02/24/2015  . COLONOSCOPY  03/17/2022  . Hepatitis C Screening  Completed    Immunization History  Administered Date(s) Administered  . MMR 02/23/2005  . Pneumococcal Polysaccharide-23 02/23/2010  . Tdap 02/23/2005   Preventative care: Last colonoscopy: 03/17/2014  Past Medical History  Diagnosis Date  . Hyperlipidemia   . Hypertension   . Anxiety   . Vitamin D deficiency   . Other testicular hypofunction    Past Surgical History  Procedure Laterality Date  . Colonoscopy    . Robot assisted laparoscopic radical prostatectomy N/A 06/23/2012    Procedure: ROBOTIC ASSISTED LAPAROSCOPIC RADICAL PROSTATECTOMY LEVEL 2;  Surgeon: Dutch Gray, MD;  Location: [facility];  Service: Urology;  Laterality: N/A;  . Lymphadenectomy Bilateral 06/23/2012    Procedure: LYMPHADENECTOMY;  Surgeon: Dutch Gray, MD;  Location: [facility];  Service: Urology;  Laterality: Bilateral;   Risk Factors: Tobacco Social History  Substance Use Topics  . Smoking status: Never Smoker   . Smokeless tobacco: Never Used  . Alcohol Use: 3.0 oz/week    5 Cans of beer per week     Comment: occ   He does not smoke.  Patient is not a former smoker. Are there smokers in your home (other than you)?  No  Alcohol Current alcohol use: 5  beers/week  Caffeine Current caffeine use: caffeinated soft drinks 1-3 /Russell   Exercise Current exercise: cardiovascular workout on exercise equipment, walking and exercises daily  Nutrition/Diet Current diet: in general, a "healthy" diet    Cardiac risk factors: advanced age (older than 23 for men, 44 for women), dyslipidemia, hypertension and male gender.  Depression Screen (Note: if answer to either of the following is "Yes", a more complete depression screening is indicated)   Q1: Over the past two weeks, have you felt down, depressed or hopeless? No  Q2: Over the past two weeks,  have you felt little interest or pleasure in doing things? No  Have you lost interest or pleasure in daily life? No  Do you often feel hopeless? No  Do you cry easily over simple problems? No  Activities of Daily Living In your present state of health, do you have any difficulty performing the following activities?:  Driving? No Managing money?  No Feeding yourself? No Getting from bed to chair? No Climbing a flight of stairs? No Preparing food and eating?: No Bathing or showering? No Getting dressed: No Getting to the toilet? No Using the toilet:No Moving around from place to place: No In the past year have you fallen or had a near fall?:No   Are you sexually active?  Yes  Do you have more than one partner?  No  Vision Difficulties: No  Hearing Difficulties: No Do you often ask people to speak up or repeat themselves? No Do you experience ringing or noises in your ears? No Do you have difficulty understanding soft or whispered voices? No  Cognition  Do you feel that you have a problem with memory?No  Do you often misplace items? No  Do you feel safe at home?  Yes  Advanced directives Does patient have a [place]? Yes Does patient have a Living Will? Yes  ROS: Constitutional: Denies fever, chills, weight loss/gain, headaches, insomnia, fatigue, night sweats or change in appetite. Eyes: Denies redness, blurred vision, diplopia, discharge, itchy or watery eyes.  ENT: Denies discharge, congestion, post nasal drip, epistaxis, sore throat, earache, hearing loss, dental pain, Tinnitus, Vertigo, Sinus pain or snoring.  Cardio: Denies chest pain, palpitations, irregular heartbeat, syncope, dyspnea, diaphoresis, orthopnea, PND, claudication or edema Respiratory: denies cough, dyspnea, DOE, pleurisy, hoarseness, laryngitis or wheezing.  Gastrointestinal: Denies dysphagia, heartburn, reflux, water brash, pain, cramps, nausea, vomiting, bloating, diarrhea,  constipation, hematemesis, melena, hematochezia, jaundice or hemorrhoids Genitourinary: Denies dysuria, frequency, urgency, nocturia, hesitancy, discharge, hematuria or flank pain Musculoskeletal: Denies arthralgia, myalgia, stiffness, Jt. Swelling, pain, limp or strain/sprain. Denies Falls. Skin: Denies puritis, rash, hives, warts, acne, eczema or change in skin lesion Neuro: No weakness, tremor, incoordination, spasms, paresthesia or pain Psychiatric: Denies confusion, memory loss or sensory loss. Denies Depression. Endocrine: Denies change in weight, skin, hair change, nocturia, and paresthesia, diabetic polys, visual blurring or hyper / hypo glycemic episodes.  Heme/Lymph: No excessive bleeding, bruising or enlarged lymph nodes.  Objective:     BP 124/80 mmHg  Pulse 56  Temp(Src) 97.3 F (36.3 C)  Resp 16  Ht 6' 6.5" (1.994 m)  Wt 240 lb 12.8 oz (109.226 kg)  BMI 27.47 kg/m2  General Appearance:  Alert  WD/WN, male  in no apparent distress. Eyes: PERRLA, EOMs nl, conjunctiva normal, normal fundi and vessels. Sinuses: No frontal/maxillary tenderness ENT/Mouth: EACs patent / TMs  nl. Nares clear without erythema, swelling, mucoid exudates. Oral hygiene is good. No erythema, swelling, or exudate.  Tongue normal, non-obstructing. Tonsils not swollen or erythematous. Hearing normal.  Neck: Supple, thyroid normal. No bruits, nodes or JVD. Respiratory: Respiratory effort normal.  BS equal and clear bilateral without rales, rhonci, wheezing or stridor. Cardio: Heart sounds are normal with regular rate and rhythm and no murmurs, rubs or gallops. Peripheral pulses are normal and equal bilaterally without edema. No aortic or femoral bruits. Chest: symmetric with normal excursions and percussion.  Abdomen: Flat, soft with nl bowel sounds. Nontender, no guarding, rebound, hernias, masses, or organomegaly.  Lymphatics: Non tender without lymphadenopathy.  Genitourinary: No hernias.Testes nl. DRE -  prostate nl for age - smooth & firm w/o nodules. Musculoskeletal: Full ROM all peripheral extremities, joint stability, 5/5 strength, and normal gait. Skin: Warm and dry without rashes, lesions, cyanosis, clubbing or  ecchymosis.  Neuro: Cranial nerves intact, reflexes equal bilaterally. Normal muscle tone, no cerebellar symptoms. Sensation intact.  Pysch: Alert and oriented X 3 with normal affect, insight and judgment appropriate.   Cognitive Testing  Alert? Yes  Normal Appearance? Yes  Oriented to person? Yes  Place? Yes   Time? Yes  Recall of three objects?  Yes  Can perform simple calculations? Yes  Displays appropriate judgment? Yes  Can read the correct time from a watch/clock? Yes  Medicare Attestation I have personally reviewed: The patient's medical and social history Their use of alcohol, tobacco or illicit drugs Their current medications and supplements The patient's functional ability including ADLs,fall risks, home safety risks, cognitive, and hearing and visual impairment Diet and physical activities Evidence for depression or mood disorders  The patient's weight, height, BMI, and visual acuity have been recorded in the chart.  I have made referrals, counseling, and provided education to the patient based on review of the above and I have provided the patient with a written personalized care plan for preventive services.  Over 40 minutes of exam, counseling, chart review was performed.  Hailyn Zarr DAVID, MD   01/31/2015

## 2015-01-31 NOTE — Patient Instructions (Signed)

## 2015-02-01 LAB — BASIC METABOLIC PANEL WITH GFR
BUN: 20 mg/dL (ref 7–25)
CHLORIDE: 105 mmol/L (ref 98–110)
CO2: 29 mmol/L (ref 20–31)
Calcium: 9.1 mg/dL (ref 8.6–10.3)
Creat: 1.02 mg/dL (ref 0.70–1.25)
GFR, EST NON AFRICAN AMERICAN: 75 mL/min (ref >=60)
GFR, Est African American: 86 mL/min (ref >=60)
Glucose, Bld: 93 mg/dL (ref 65–99)
POTASSIUM: 4.5 mmol/L (ref 3.5–5.3)
SODIUM: 141 mmol/L (ref 135–146)

## 2015-02-01 LAB — MICROALBUMIN / CREATININE URINE RATIO
CREATININE, URINE: 196 mg/dL (ref 20–370)
MICROALB UR: 9.5 mg/dL
MICROALB/CREAT RATIO: 48 ug/mg{creat} — AB (ref <30)

## 2015-02-01 LAB — HEPATIC FUNCTION PANEL
ALT: 17 U/L (ref 9–46)
AST: 27 U/L (ref 10–35)
Albumin: 3.8 g/dL (ref 3.6–5.1)
Alkaline Phosphatase: 56 U/L (ref 40–115)
BILIRUBIN DIRECT: 0.4 mg/dL — AB (ref <=0.2)
BILIRUBIN INDIRECT: 2 mg/dL — AB (ref 0.2–1.2)
BILIRUBIN TOTAL: 2.4 mg/dL — AB (ref 0.2–1.2)
Total Protein: 6.6 g/dL (ref 6.1–8.1)

## 2015-02-01 LAB — LIPID PANEL
CHOL/HDL RATIO: 1.9 ratio (ref <=5.0)
Cholesterol: 169 mg/dL (ref 125–200)
HDL: 91 mg/dL (ref >=40)
LDL Cholesterol: 68 mg/dL (ref <130)
Triglycerides: 49 mg/dL (ref <150)
VLDL: 10 mg/dL (ref <30)

## 2015-02-01 LAB — HEMOGLOBIN A1C
HEMOGLOBIN A1C: 5 % (ref <5.7)
Mean Plasma Glucose: 97 mg/dL (ref <117)

## 2015-02-01 LAB — VITAMIN D 25 HYDROXY (VIT D DEFICIENCY, FRACTURES): VIT D 25 HYDROXY: 68 ng/mL (ref 30–100)

## 2015-02-01 LAB — INSULIN, RANDOM: Insulin: 3.4 u[IU]/mL (ref 2.0–19.6)

## 2015-02-01 LAB — MAGNESIUM: Magnesium: 1.9 mg/dL (ref 1.5–2.5)

## 2015-02-02 ENCOUNTER — Encounter: Payer: Self-pay | Admitting: Internal Medicine

## 2015-02-04 DIAGNOSIS — H40003 Preglaucoma, unspecified, bilateral: Secondary | ICD-10-CM | POA: Diagnosis not present

## 2015-04-10 ENCOUNTER — Other Ambulatory Visit: Payer: Self-pay | Admitting: Internal Medicine

## 2015-04-10 NOTE — Telephone Encounter (Signed)
rx called in to [facility] 

## 2015-05-08 ENCOUNTER — Ambulatory Visit: Payer: Self-pay | Admitting: Internal Medicine

## 2015-05-28 ENCOUNTER — Ambulatory Visit (INDEPENDENT_AMBULATORY_CARE_PROVIDER_SITE_OTHER): Payer: Medicare Other | Admitting: Internal Medicine

## 2015-05-28 ENCOUNTER — Encounter: Payer: Self-pay | Admitting: Internal Medicine

## 2015-05-28 VITALS — BP 118/76 | HR 58 | Temp 98.0°F | Resp 18 | Ht 78.5 in | Wt 232.0 lb

## 2015-05-28 DIAGNOSIS — R7309 Other abnormal glucose: Secondary | ICD-10-CM | POA: Diagnosis not present

## 2015-05-28 DIAGNOSIS — Z79899 Other long term (current) drug therapy: Secondary | ICD-10-CM

## 2015-05-28 DIAGNOSIS — I10 Essential (primary) hypertension: Secondary | ICD-10-CM | POA: Diagnosis not present

## 2015-05-28 DIAGNOSIS — E559 Vitamin D deficiency, unspecified: Secondary | ICD-10-CM

## 2015-05-28 DIAGNOSIS — E785 Hyperlipidemia, unspecified: Secondary | ICD-10-CM

## 2015-05-28 NOTE — Progress Notes (Signed)
Assessment and Plan:  Hypertension:  -reported hypotension with exercising, mildly hypotension today recommend cutting losartan in half and monitoring for the next 2 weeks -send mychart message in 2 weeks -Continue medication,  -monitor blood pressure at home.  -Continue DASH diet.   -Reminder to go to the ER if any CP, SOB, nausea, dizziness, severe HA, changes vision/speech, left arm numbness and tingling, and jaw pain.  Cholesterol: -Continue diet and exercise.  -declines lab work today given last labs were excellent   Pre-diabetes: -Continue diet and exercise.    Vitamin D Def: -continue medications.   Continue diet and meds as discussed. Further disposition pending results of labs.  HPI 70 y.o. male  presents for 3 month follow up with hypertension, hyperlipidemia, prediabetes and vitamin D.   His blood pressure has been controlled at home, today their BP is BP: 118/76 mmHg.   He does workout. He denies chest pain, shortness of breath, dizziness.  He is working out for an hour and a half.  He reports that when he is exercising he is having some really low BP and it is running anywhere form 90/60.   He is on cholesterol medication and denies myalgias. His cholesterol is at goal. The cholesterol last visit was:   Lab Results  Component Value Date   CHOL 169 01/31/2015   HDL 91 01/31/2015   LDLCALC 68 01/31/2015   TRIG 49 01/31/2015   CHOLHDL 1.9 01/31/2015     He has been working on diet and exercise for prediabetes, and denies foot ulcerations, hyperglycemia, hypoglycemia , increased appetite, nausea, paresthesia of the feet, polydipsia, polyuria, visual disturbances, vomiting and weight loss. Last A1C in the office was:  Lab Results  Component Value Date   HGBA1C 5.0 01/31/2015    Patient is on Vitamin D supplement.  Lab Results  Component Value Date   VD25OH 68 01/31/2015      Current Medications:  Current Outpatient Prescriptions on File Prior to Visit   Medication Sig Dispense Refill  . ALPRAZolam (XANAX) 1 MG tablet TAKE ONE TABLET BY MOUTH ONCE DAILY AS NEEDED FOR SLEEP 90 tablet 0  . aspirin 325 MG tablet Take 1/2 tablet daily    . atenolol (TENORMIN) 50 MG tablet Take 0.5 tablets (25 mg total) by mouth at bedtime. For BP 90 tablet 1  . Cholecalciferol (VITAMIN D3) 5000 UNITS CAPS Take 5,000 Int'l Units by mouth daily.    . diphenhydrAMINE (BENADRYL) 25 mg capsule Take 25 mg by mouth at bedtime as needed for sleep.    Marland Kitchen doxazosin (CARDURA) 8 MG tablet Take 1/2 to 1 tablet bedtime for BP & Prostate as directed 90 tablet 1  . fluocinonide cream (LIDEX) 0.05 %   0  . losartan (COZAAR) 100 MG tablet TAKE 1 TABLET EVERY DAY FOR BLOOD PRESSURE 90 tablet 1  . simvastatin (ZOCOR) 80 MG tablet Take 40 mg by mouth daily. Takes 1/2 pill qhs for cholesterol    . valACYclovir (VALTREX) 500 MG tablet Take 1 tablet (500 mg total) by mouth daily. FOR FEVER BLISTERS 90 tablet 1   No current facility-administered medications on file prior to visit.    Medical History:  Past Medical History  Diagnosis Date  . Hyperlipidemia   . Hypertension   . Anxiety   . Vitamin D deficiency   . Other testicular hypofunction     Allergies:  Allergies  Allergen Reactions  . Ultram [Tramadol] Anaphylaxis     Review of Systems:  Review of Systems  Constitutional: Negative for fever, weight loss and malaise/fatigue.  HENT: Negative for congestion, ear pain and sore throat.   Eyes: Negative.   Respiratory: Negative for cough, shortness of breath and wheezing.   Cardiovascular: Negative for chest pain, palpitations and leg swelling.  Gastrointestinal: Negative for heartburn, abdominal pain, diarrhea, constipation, blood in stool and melena.  Genitourinary: Negative.   Skin: Negative.   Neurological: Negative for dizziness, sensory change, loss of consciousness and headaches.  Psychiatric/Behavioral: Negative for depression. The patient is not nervous/anxious  and does not have insomnia.     Family history- Review and unchanged  Social history- Review and unchanged  Physical Exam: BP 118/76 mmHg  Pulse 58  Temp(Src) 98 F (36.7 C) (Temporal)  Resp 18  Ht 6' 6.5" (1.994 m)  Wt 232 lb (105.235 kg)  BMI 26.47 kg/m2 Wt Readings from Last 3 Encounters:  05/28/15 232 lb (105.235 kg)  01/31/15 240 lb 12.8 oz (109.226 kg)  08/06/14 237 lb (107.502 kg)    General Appearance: Well nourished well developed, in no apparent distress. Eyes: PERRLA, EOMs, conjunctiva no swelling or erythema ENT/Mouth: Ear canals normal without obstruction, swelling, erythma, discharge.  TMs normal bilaterally.  Oropharynx moist, clear, without exudate, or postoropharyngeal swelling. Neck: Supple, thyroid normal,no cervical adenopathy  Respiratory: Respiratory effort normal, Breath sounds clear A&P without rhonchi, wheeze, or rale.  No retractions, no accessory usage. Cardio: RRR with no MRGs. Brisk peripheral pulses without edema.  Abdomen: Soft, + BS,  Non tender, no guarding, rebound, hernias, masses. Musculoskeletal: Full ROM, 5/5 strength, Normal gait Skin: Warm, dry without rashes, lesions, ecchymosis.  Neuro: Awake and oriented X 3, Cranial nerves intact. Normal muscle tone, no cerebellar symptoms. Psych: Normal affect, Insight and Judgment appropriate.    Starlyn Skeans, PA-C 9:38 AM [facility]

## 2015-05-28 NOTE — Patient Instructions (Signed)
Please cut losartan in half.    Make sure you are drinking plenty of water.  Please keep track of your blood pressure over the next two weeks at the gym and any dizzy spells.  Send me a message and let me know what your blood pressures are doing.

## 2015-06-12 ENCOUNTER — Encounter: Payer: Self-pay | Admitting: Internal Medicine

## 2015-07-02 DIAGNOSIS — C61 Malignant neoplasm of prostate: Secondary | ICD-10-CM | POA: Diagnosis not present

## 2015-07-09 DIAGNOSIS — Z0000 Encounter for general adult medical examination without abnormal findings: Secondary | ICD-10-CM | POA: Diagnosis not present

## 2015-07-09 DIAGNOSIS — C61 Malignant neoplasm of prostate: Secondary | ICD-10-CM | POA: Diagnosis not present

## 2015-07-09 DIAGNOSIS — N5201 Erectile dysfunction due to arterial insufficiency: Secondary | ICD-10-CM | POA: Diagnosis not present

## 2015-08-08 ENCOUNTER — Other Ambulatory Visit: Payer: Self-pay | Admitting: Internal Medicine

## 2015-08-14 ENCOUNTER — Ambulatory Visit: Payer: Self-pay | Admitting: Internal Medicine

## 2015-09-12 ENCOUNTER — Encounter: Payer: Self-pay | Admitting: Internal Medicine

## 2015-09-12 ENCOUNTER — Ambulatory Visit (INDEPENDENT_AMBULATORY_CARE_PROVIDER_SITE_OTHER): Payer: Medicare Other | Admitting: Internal Medicine

## 2015-09-12 VITALS — BP 122/80 | HR 60 | Temp 97.5°F | Resp 16 | Ht 78.5 in | Wt 227.0 lb

## 2015-09-12 DIAGNOSIS — E349 Endocrine disorder, unspecified: Secondary | ICD-10-CM

## 2015-09-12 DIAGNOSIS — E785 Hyperlipidemia, unspecified: Secondary | ICD-10-CM | POA: Diagnosis not present

## 2015-09-12 DIAGNOSIS — R7309 Other abnormal glucose: Secondary | ICD-10-CM

## 2015-09-12 DIAGNOSIS — E291 Testicular hypofunction: Secondary | ICD-10-CM

## 2015-09-12 DIAGNOSIS — E559 Vitamin D deficiency, unspecified: Secondary | ICD-10-CM | POA: Diagnosis not present

## 2015-09-12 DIAGNOSIS — I10 Essential (primary) hypertension: Secondary | ICD-10-CM

## 2015-09-12 DIAGNOSIS — Z79899 Other long term (current) drug therapy: Secondary | ICD-10-CM | POA: Diagnosis not present

## 2015-09-12 LAB — CBC WITH DIFFERENTIAL/PLATELET
BASOS ABS: 38 {cells}/uL (ref 0–200)
BASOS PCT: 1 %
EOS ABS: 228 {cells}/uL (ref 15–500)
EOS PCT: 6 %
HCT: 37.7 % — ABNORMAL LOW (ref 38.5–50.0)
HEMOGLOBIN: 12.8 g/dL — AB (ref 13.2–17.1)
LYMPHS ABS: 1216 {cells}/uL (ref 850–3900)
Lymphocytes Relative: 32 %
MCH: 33.2 pg — AB (ref 27.0–33.0)
MCHC: 34 g/dL (ref 32.0–36.0)
MCV: 97.9 fL (ref 80.0–100.0)
MPV: 9.6 fL (ref 7.5–12.5)
Monocytes Absolute: 418 cells/uL (ref 200–950)
Monocytes Relative: 11 %
NEUTROS ABS: 1900 {cells}/uL (ref 1500–7800)
Neutrophils Relative %: 50 %
Platelets: 178 10*3/uL (ref 140–400)
RBC: 3.85 MIL/uL — ABNORMAL LOW (ref 4.20–5.80)
RDW: 13.3 % (ref 11.0–15.0)
WBC: 3.8 10*3/uL (ref 3.8–10.8)

## 2015-09-12 LAB — MAGNESIUM: Magnesium: 2.1 mg/dL (ref 1.5–2.5)

## 2015-09-12 LAB — BASIC METABOLIC PANEL WITH GFR
BUN: 21 mg/dL (ref 7–25)
CHLORIDE: 107 mmol/L (ref 98–110)
CO2: 26 mmol/L (ref 20–31)
CREATININE: 1.08 mg/dL (ref 0.70–1.18)
Calcium: 8.7 mg/dL (ref 8.6–10.3)
GFR, Est African American: 80 mL/min (ref >=60)
GFR, Est Non African American: 69 mL/min (ref >=60)
Glucose, Bld: 93 mg/dL (ref 65–99)
POTASSIUM: 4.4 mmol/L (ref 3.5–5.3)
SODIUM: 140 mmol/L (ref 135–146)

## 2015-09-12 LAB — LIPID PANEL
Cholesterol: 164 mg/dL (ref 125–200)
HDL: 92 mg/dL (ref >=40)
LDL CALC: 63 mg/dL (ref <130)
Total CHOL/HDL Ratio: 1.8 Ratio (ref <=5.0)
Triglycerides: 47 mg/dL (ref <150)
VLDL: 9 mg/dL (ref <30)

## 2015-09-12 LAB — HEPATIC FUNCTION PANEL
ALK PHOS: 64 U/L (ref 40–115)
ALT: 15 U/L (ref 9–46)
AST: 22 U/L (ref 10–35)
Albumin: 3.9 g/dL (ref 3.6–5.1)
BILIRUBIN DIRECT: 0.5 mg/dL — AB (ref <=0.2)
BILIRUBIN INDIRECT: 1.8 mg/dL — AB (ref 0.2–1.2)
Total Bilirubin: 2.3 mg/dL — ABNORMAL HIGH (ref 0.2–1.2)
Total Protein: 6.6 g/dL (ref 6.1–8.1)

## 2015-09-12 LAB — TSH: TSH: 1.91 m[IU]/L (ref 0.40–4.50)

## 2015-09-12 NOTE — Patient Instructions (Signed)

## 2015-09-12 NOTE — Progress Notes (Signed)
Patient ID: Kenneth Russell, male   DOB: 1945/03/09, 70 y.o.   MRN: [id number]  [facility] ADULT & ADOLESC[facility]NAL MEDICINE                       Unk Pinto, M.D.        Uvaldo Bristle. Silverio Lay, P.A.-C       Starlyn Skeans, P.A.-C   [facility]                [address] [place], [place] [address] Telephone [phone] Telefax [phone] ______________________________________________________________________     This very nice 70 y.o. MWM presents for 6 month follow up with Hypertension, Hyperlipidemia, Pre-Diabetes and Vitamin D Deficiency.      Patient is treated for HTN circa 2006 & BP has been controlled at home. Today's BP is  122/80 mmHg. Patient had a negative Cardiolite in 2007. Patient has remote hx/o [facility] in 1988 w/o sequelae.  Patient has had no complaints of any cardiac type chest pain, palpitations, dyspnea/orthopnea/PND, claudication, or dependent edema. Patient does report occasional dizziness or light-headedness and is advised to stop his low dose Doxazosin 4 mg since he is post Radical Prostatectomy in 2014 by Dr Dutch Gray - whom patient saw recently for PSA check which he reports was undetectable.       Hyperlipidemia is controlled with diet & meds. Patient denies myalgias or other med SE's. Last Lipids were at goal with Cholesterol 169; HDL 91; LDL 68; Triglycerides 49 on 01/31/2015.     Also, the patient is screened proactively for PreDiabetes and has had no symptoms of reactive hypoglycemia, diabetic polys, paresthesias or visual blurring.  Last A1c was 5.0% on 01/31/2015.      Further, the patient also has history of Vitamin D Deficiency of "24" in 2008  and supplements vitamin D without any suspected side-effects. Last vitamin D was  68 on 01/31/2015.     Medication Sig  . ALPRAZolam  1 MG tablet TAKE ONE TABLET BY MOUTH ONCE DAILY AS NEEDED FOR SLEEP  . aspirin 325 MG tablet Take 1/2 tablet daily  . atenolol  50 MG  tablet Take 0.5 tablets (25 mg total) by mouth at bedtime. For BP  . VITAMIN D 5000 UNITS CAPS Take 5,000 Int'l Units by mouth daily.  . diphenhydrAMINE  25 mg capsule Take 25 mg by mouth at bedtime as needed for sleep.  Marland Kitchen doxazosin  8 MG tablet Take 1/2 to 1 tablet bedtime for BP & Prostate as directed  . fluocinonide cream (LIDEX) 0.05 %   . Glucosamine-Chondroit Take by mouth daily.  Marland Kitchen losartan  100 MG tablet TAKE 1 TABLET EVERY DAY FOR BLOOD PRESSURE  . simvastatin 80 MG tablet Take 40 mg by mouth daily. Takes 1/2 pill qhs for cholesterol  . valACYclovir  500 MG tablet TAKE 1 TABLET EVERY DAY  FOR  FEVER  BLISTERS   Allergies  Allergen Reactions  . Ultram [Tramadol] Anaphylaxis   PMHx:   Past Medical History  Diagnosis Date  . Hyperlipidemia   . Hypertension   . Anxiety   . Vitamin D deficiency   . Other testicular hypofunction    Immunization History  Administered Date(s) Administered  . Influenza, High Dose Seasonal PF 01/31/2015  . MMR 02/23/2005  . Pneumococcal Conjugate-13 01/31/2015  . Pneumococcal Polysaccharide-23 02/23/2010  . Tdap 02/23/2005  Past Surgical History  Procedure Laterality Date  . Colonoscopy    . Robot assisted laparoscopic radical prostatectomy N/A 06/23/2012    Procedure: ROBOTIC ASSISTED LAPAROSCOPIC RADICAL PROSTATECTOMY LEVEL 2;  Surgeon: Dutch Gray, MD  . Lymphadenectomy Bilateral 06/23/2012    Procedure: LYMPHADENECTOMY;  Surgeon: Dutch Gray, MD   FHx:    Reviewed / unchanged  SHx:    Reviewed / unchanged  Systems Review:  Constitutional: Denies fever, chills, wt changes, headaches, insomnia, fatigue, night sweats, change in appetite. Eyes: Denies redness, blurred vision, diplopia, discharge, itchy, watery eyes.  ENT: Denies discharge, congestion, post nasal drip, epistaxis, sore throat, earache, hearing loss, dental pain, tinnitus, vertigo, sinus pain, snoring.  CV: Denies chest pain, palpitations, irregular heartbeat, syncope, dyspnea,  diaphoresis, orthopnea, PND, claudication or edema. Respiratory: denies cough, dyspnea, DOE, pleurisy, hoarseness, laryngitis, wheezing.  Gastrointestinal: Denies dysphagia, odynophagia, heartburn, reflux, water brash, abdominal pain or cramps, nausea, vomiting, bloating, diarrhea, constipation, hematemesis, melena, hematochezia  or hemorrhoids. Genitourinary: Denies dysuria, frequency, urgency, nocturia, hesitancy, discharge, hematuria or flank pain. Musculoskeletal: Denies arthralgias, myalgias, stiffness, jt. swelling, pain, limping or strain/sprain.  Skin: Denies pruritus, rash, hives, warts, acne, eczema or change in skin lesion(s). Neuro: No weakness, tremor, incoordination, spasms, paresthesia or pain. Psychiatric: Denies confusion, memory loss or sensory loss. Endo: Denies change in weight, skin or hair change.  Heme/Lymph: No excessive bleeding, bruising or enlarged lymph nodes.  Physical Exam  BP 122/80   Pulse 60  Temp 97.5 F   Resp 16  Ht 6' 6.5"  Wt 227 lb     BMI 25.90   Appears well nourished and in no distress.  Eyes: PERRLA, EOMs, conjunctiva no swelling or erythema. Sinuses: No frontal/maxillary tenderness ENT/Mouth: EAC's clear, TM's nl w/o erythema, bulging. Nares clear w/o erythema, swelling, exudates. Oropharynx clear without erythema or exudates. Oral hygiene is good. Tongue normal, non obstructing. Hearing intact.  Neck: Supple. Thyroid nl. Car 2+/2+ without bruits, nodes or JVD. Chest: Respirations nl with BS clear & equal w/o rales, rhonchi, wheezing or stridor.  Cor: Heart sounds normal w/ regular rate and rhythm without sig. murmurs, gallops, clicks, or rubs. Peripheral pulses normal and equal  without edema.  Abdomen: Soft & bowel sounds normal. Non-tender w/o guarding, rebound, hernias, masses, or organomegaly.  Lymphatics: Unremarkable.  Musculoskeletal: Full ROM all peripheral extremities, joint stability, 5/5 strength, and normal gait.  Skin: Warm,  dry without exposed rashes, lesions or ecchymosis apparent.  Neuro: Cranial nerves intact, reflexes equal bilaterally. Sensory-motor testing grossly intact. Tendon reflexes grossly intact.  Pysch: Alert & oriented x 3.  Insight and judgement nl & appropriate. No ideations.  Assessment and Plan:   1. Essential hypertension  - Continue medication, monitor blood pressure at home. Continue DASH diet. Reminder to go to the ER if any CP, SOB, nausea, dizziness, severe HA, changes vision/speech, left arm numbness and tingling and jaw pain. - TSH  2. Hyperlipidemia  - Continue diet/meds, exercise,& lifestyle modifications. Continue monitor periodic cholesterol/liver & renal functions  - Lipid panel - TSH  3. Abnormal glucose  - Continue diet, exercise, lifestyle modifications. Monitor appropriate labs. - Hemoglobin A1c - Insulin, random  4. Vitamin D deficiency  - Continue supplementation. - VITAMIN D 25 Hydroxy   5. Testosterone deficiency   6. Medication management  - CBC with Differential/Platelet - BASIC METABOLIC PANEL WITH GFR - Hepatic function panel - Magnesium   Recommended regular exercise, BP monitoring, weight control, and discussed med and SE's. Recommended labs to  assess and monitor clinical status. Further disposition pending results of labs. Over 30 minutes of exam, counseling, chart review was performed

## 2015-09-13 LAB — VITAMIN D 25 HYDROXY (VIT D DEFICIENCY, FRACTURES): Vit D, 25-Hydroxy: 65 ng/mL (ref 30–100)

## 2015-09-13 LAB — HEMOGLOBIN A1C
HEMOGLOBIN A1C: 5.3 % (ref <5.7)
MEAN PLASMA GLUCOSE: 105 mg/dL

## 2015-09-13 LAB — INSULIN, RANDOM: Insulin: 5.2 u[IU]/mL (ref 2.0–19.6)

## 2015-09-16 ENCOUNTER — Other Ambulatory Visit: Payer: Self-pay | Admitting: *Deleted

## 2015-09-16 MED ORDER — ALPRAZOLAM 1 MG PO TABS: ORAL_TABLET | ORAL | 0 refills | Status: DC

## 2015-10-06 ENCOUNTER — Other Ambulatory Visit: Payer: Self-pay | Admitting: Internal Medicine

## 2015-10-06 DIAGNOSIS — I10 Essential (primary) hypertension: Secondary | ICD-10-CM

## 2015-10-10 ENCOUNTER — Ambulatory Visit (INDEPENDENT_AMBULATORY_CARE_PROVIDER_SITE_OTHER): Payer: Medicare Other | Admitting: Internal Medicine

## 2015-10-10 ENCOUNTER — Encounter: Payer: Self-pay | Admitting: Internal Medicine

## 2015-10-10 VITALS — BP 100/60 | HR 74 | Temp 98.2°F | Resp 18 | Ht 78.5 in

## 2015-10-10 DIAGNOSIS — M109 Gout, unspecified: Secondary | ICD-10-CM

## 2015-10-10 DIAGNOSIS — M1000 Idiopathic gout, unspecified site: Secondary | ICD-10-CM

## 2015-10-10 LAB — URIC ACID: URIC ACID, SERUM: 4.1 mg/dL (ref 4.0–8.0)

## 2015-10-10 MED ORDER — MELOXICAM 15 MG PO TABS: 15.0000 mg | ORAL_TABLET | Freq: Every day | ORAL | 0 refills | Status: DC

## 2015-10-10 MED ORDER — PREDNISONE 20 MG PO TABS: ORAL_TABLET | ORAL | 2 refills | Status: DC

## 2015-10-10 NOTE — Patient Instructions (Signed)

## 2015-10-10 NOTE — Progress Notes (Signed)
   Subjective:    Patient ID: Kenneth Russell, male    DOB: 26-Dec-1945, 70 y.o.   MRN: [id number]  HPI  Patient presents to the office for evaluation of great toe pain which started approximately 2 weeks ago.  He reports that it got hot, red, and swollen.  He took some leftover predisone at home and this helped but then it came back.  He reports that he has never had a gout flare in the past.  He did eat a lot of shellfish in the last couple weeks.  He also played a lot of tennis.    Review of Systems  Constitutional: Negative for chills and fever.  HENT: Negative for congestion, ear pain and sore throat.   Eyes: Negative.   Respiratory: Negative for cough, shortness of breath and wheezing.   Cardiovascular: Negative for chest pain, palpitations and leg swelling.  Gastrointestinal: Negative for abdominal pain, blood in stool, constipation and diarrhea.  Genitourinary: Negative.   Musculoskeletal: Positive for arthralgias and joint swelling.  Skin: Negative.   Neurological: Negative for dizziness and headaches.  Psychiatric/Behavioral: The patient is not nervous/anxious.        Objective:   Physical Exam  Constitutional: He is oriented to person, place, and time. He appears well-developed and well-nourished. No distress.  HENT:  Head: Normocephalic.  Mouth/Throat: Oropharynx is clear and moist. No oropharyngeal exudate.  Eyes: Conjunctivae are normal. No scleral icterus.  Neck: Normal range of motion. Neck supple. No JVD present. No thyromegaly present.  Cardiovascular: Normal rate, regular rhythm, normal heart sounds and intact distal pulses.  Exam reveals no gallop and no friction rub.   No murmur heard. Pulmonary/Chest: Effort normal and breath sounds normal. No respiratory distress. He has no wheezes. He has no rales. He exhibits no tenderness.  Abdominal: Soft. Bowel sounds are normal. He exhibits no distension and no mass. There is no tenderness. There is no rebound and no  guarding.  Musculoskeletal:       Right ankle: No tenderness.       Left ankle: He exhibits decreased range of motion and swelling. He exhibits no ecchymosis, no deformity, no laceration and normal pulse. No tenderness. Achilles tendon normal.       Feet:  Lymphadenopathy:    He has no cervical adenopathy.  Neurological: He is alert and oriented to person, place, and time. No cranial nerve deficit. Coordination normal.  Skin: Skin is warm and dry. No rash noted. He is not diaphoretic.  Psychiatric: He has a normal mood and affect. His behavior is normal. Judgment and thought content normal.  Nursing note and vitals reviewed.   Vitals:   10/10/15 1610  BP: 100/60  Pulse: 74  Resp: 18  Temp: 98.2 F (36.8 C)          Assessment & Plan:    1. Podagra -meloxicam prn -hard soled shoes -consider allopurinol in 6 months if elevated uric acid - Uric acid - predniSONE (DELTASONE) 20 MG tablet; 3 tabs po daily x 3 days, then 2 tabs x 3 days, then 1.5 tabs x 3 days, then 1 tab x 3 days, then 0.5 tabs x 3 days  Dispense: 27 tablet; Refill: 2

## 2015-11-21 ENCOUNTER — Other Ambulatory Visit: Payer: Self-pay | Admitting: Internal Medicine

## 2015-11-21 DIAGNOSIS — I10 Essential (primary) hypertension: Secondary | ICD-10-CM

## 2015-11-25 ENCOUNTER — Other Ambulatory Visit: Payer: Self-pay | Admitting: *Deleted

## 2015-11-25 DIAGNOSIS — I10 Essential (primary) hypertension: Secondary | ICD-10-CM

## 2015-11-25 MED ORDER — ATENOLOL 50 MG PO TABS: ORAL_TABLET | ORAL | 1 refills | Status: DC

## 2015-11-28 DIAGNOSIS — H40003 Preglaucoma, unspecified, bilateral: Secondary | ICD-10-CM | POA: Diagnosis not present

## 2015-12-24 DIAGNOSIS — N5201 Erectile dysfunction due to arterial insufficiency: Secondary | ICD-10-CM | POA: Diagnosis not present

## 2015-12-26 ENCOUNTER — Other Ambulatory Visit: Payer: Self-pay | Admitting: *Deleted

## 2015-12-26 MED ORDER — VALACYCLOVIR HCL 500 MG PO TABS: ORAL_TABLET | ORAL | 1 refills | Status: DC

## 2015-12-27 DIAGNOSIS — Z23 Encounter for immunization: Secondary | ICD-10-CM | POA: Diagnosis not present

## 2015-12-30 ENCOUNTER — Other Ambulatory Visit: Payer: Self-pay | Admitting: Internal Medicine

## 2015-12-30 MED ORDER — VALACYCLOVIR HCL 500 MG PO TABS: ORAL_TABLET | ORAL | 1 refills | Status: DC

## 2016-01-24 DIAGNOSIS — L570 Actinic keratosis: Secondary | ICD-10-CM | POA: Diagnosis not present

## 2016-01-24 DIAGNOSIS — D1723 Benign lipomatous neoplasm of skin and subcutaneous tissue of right leg: Secondary | ICD-10-CM | POA: Diagnosis not present

## 2016-01-24 DIAGNOSIS — L812 Freckles: Secondary | ICD-10-CM | POA: Diagnosis not present

## 2016-01-24 DIAGNOSIS — L821 Other seborrheic keratosis: Secondary | ICD-10-CM | POA: Diagnosis not present

## 2016-01-24 DIAGNOSIS — L281 Prurigo nodularis: Secondary | ICD-10-CM | POA: Diagnosis not present

## 2016-01-24 DIAGNOSIS — L918 Other hypertrophic disorders of the skin: Secondary | ICD-10-CM | POA: Diagnosis not present

## 2016-01-27 DIAGNOSIS — H40003 Preglaucoma, unspecified, bilateral: Secondary | ICD-10-CM | POA: Diagnosis not present

## 2016-02-12 ENCOUNTER — Ambulatory Visit (INDEPENDENT_AMBULATORY_CARE_PROVIDER_SITE_OTHER): Payer: Medicare Other | Admitting: Internal Medicine

## 2016-02-12 ENCOUNTER — Other Ambulatory Visit: Payer: Self-pay | Admitting: Internal Medicine

## 2016-02-12 ENCOUNTER — Encounter: Payer: Self-pay | Admitting: Internal Medicine

## 2016-02-12 VITALS — BP 136/82 | HR 54 | Temp 98.4°F | Resp 16 | Ht 78.5 in | Wt 230.0 lb

## 2016-02-12 DIAGNOSIS — Z0001 Encounter for general adult medical examination with abnormal findings: Secondary | ICD-10-CM

## 2016-02-12 DIAGNOSIS — C61 Malignant neoplasm of prostate: Secondary | ICD-10-CM

## 2016-02-12 DIAGNOSIS — I10 Essential (primary) hypertension: Secondary | ICD-10-CM | POA: Diagnosis not present

## 2016-02-12 DIAGNOSIS — R7309 Other abnormal glucose: Secondary | ICD-10-CM | POA: Diagnosis not present

## 2016-02-12 DIAGNOSIS — Z79899 Other long term (current) drug therapy: Secondary | ICD-10-CM | POA: Diagnosis not present

## 2016-02-12 DIAGNOSIS — E559 Vitamin D deficiency, unspecified: Secondary | ICD-10-CM | POA: Diagnosis not present

## 2016-02-12 DIAGNOSIS — E782 Mixed hyperlipidemia: Secondary | ICD-10-CM

## 2016-02-12 LAB — CBC WITH DIFFERENTIAL/PLATELET
BASOS ABS: 90 {cells}/uL (ref 0–200)
Basophils Relative: 2 %
EOS ABS: 315 {cells}/uL (ref 15–500)
Eosinophils Relative: 7 %
HCT: 42.2 % (ref 38.5–50.0)
Hemoglobin: 14.4 g/dL (ref 13.2–17.1)
LYMPHS PCT: 33 %
Lymphs Abs: 1485 cells/uL (ref 850–3900)
MCH: 33.2 pg — AB (ref 27.0–33.0)
MCHC: 34.1 g/dL (ref 32.0–36.0)
MCV: 97.2 fL (ref 80.0–100.0)
MONOS PCT: 11 %
MPV: 9 fL (ref 7.5–12.5)
Monocytes Absolute: 495 cells/uL (ref 200–950)
Neutro Abs: 2115 cells/uL (ref 1500–7800)
Neutrophils Relative %: 47 %
PLATELETS: 229 10*3/uL (ref 140–400)
RBC: 4.34 MIL/uL (ref 4.20–5.80)
RDW: 13.3 % (ref 11.0–15.0)
WBC: 4.5 10*3/uL (ref 3.8–10.8)

## 2016-02-12 LAB — MICROALBUMIN / CREATININE URINE RATIO
CREATININE, URINE: 211 mg/dL (ref 20–370)
Microalb Creat Ratio: 71 mcg/mg creat — ABNORMAL HIGH (ref <30)
Microalb, Ur: 14.9 mg/dL

## 2016-02-12 LAB — URINALYSIS, ROUTINE W REFLEX MICROSCOPIC
BILIRUBIN URINE: NEGATIVE
Glucose, UA: NEGATIVE
Hgb urine dipstick: NEGATIVE
KETONES UR: NEGATIVE
Leukocytes, UA: NEGATIVE
Nitrite: NEGATIVE
Specific Gravity, Urine: 1.021 (ref 1.001–1.035)
pH: 6.5 (ref 5.0–8.0)

## 2016-02-12 LAB — URINALYSIS, MICROSCOPIC ONLY
BACTERIA UA: NONE SEEN [HPF]
CASTS: NONE SEEN [LPF]
Crystals: NONE SEEN [HPF]
SQUAMOUS EPITHELIAL / LPF: NONE SEEN [HPF] (ref <=5)
WBC, UA: NONE SEEN WBC/HPF (ref <=5)
YEAST: NONE SEEN [HPF]

## 2016-02-12 LAB — HEPATIC FUNCTION PANEL
ALBUMIN: 4.1 g/dL (ref 3.6–5.1)
ALT: 15 U/L (ref 9–46)
AST: 28 U/L (ref 10–35)
Alkaline Phosphatase: 75 U/L (ref 40–115)
BILIRUBIN TOTAL: 2.2 mg/dL — AB (ref 0.2–1.2)
Bilirubin, Direct: 0.4 mg/dL — ABNORMAL HIGH (ref <=0.2)
Indirect Bilirubin: 1.8 mg/dL — ABNORMAL HIGH (ref 0.2–1.2)
Total Protein: 6.7 g/dL (ref 6.1–8.1)

## 2016-02-12 LAB — LIPID PANEL
Cholesterol: 179 mg/dL (ref <200)
HDL: 100 mg/dL (ref >40)
LDL Cholesterol: 71 mg/dL (ref <100)
TRIGLYCERIDES: 40 mg/dL (ref <150)
Total CHOL/HDL Ratio: 1.8 Ratio (ref <5.0)
VLDL: 8 mg/dL (ref <30)

## 2016-02-12 LAB — VITAMIN D 25 HYDROXY (VIT D DEFICIENCY, FRACTURES): Vit D, 25-Hydroxy: 74 ng/mL (ref 30–100)

## 2016-02-12 LAB — BASIC METABOLIC PANEL WITH GFR
BUN: 18 mg/dL (ref 7–25)
CO2: 29 mmol/L (ref 20–31)
CREATININE: 1.06 mg/dL (ref 0.70–1.18)
Calcium: 9.2 mg/dL (ref 8.6–10.3)
Chloride: 105 mmol/L (ref 98–110)
GFR, EST AFRICAN AMERICAN: 82 mL/min (ref >=60)
GFR, Est Non African American: 71 mL/min (ref >=60)
Glucose, Bld: 88 mg/dL (ref 65–99)
Potassium: 4.9 mmol/L (ref 3.5–5.3)
Sodium: 141 mmol/L (ref 135–146)

## 2016-02-12 LAB — MAGNESIUM: MAGNESIUM: 2.2 mg/dL (ref 1.5–2.5)

## 2016-02-12 LAB — TSH: TSH: 1.68 m[IU]/L (ref 0.40–4.50)

## 2016-02-12 LAB — PSA

## 2016-02-12 MED ORDER — SIMVASTATIN 80 MG PO TABS
80.0000 mg | ORAL_TABLET | Freq: Every day | ORAL | 1 refills | Status: DC
Start: 2016-02-12 — End: 2016-02-12

## 2016-02-12 MED ORDER — ALPRAZOLAM 1 MG PO TABS: ORAL_TABLET | ORAL | 0 refills | Status: DC

## 2016-02-12 NOTE — Progress Notes (Signed)
Complete Physical  Assessment and Plan:   1. Encounter for general adult medical examination with abnormal findings -physical next year will be due  2. Essential hypertension  - Urinalysis, Routine w reflex microscopic - Microalbumin / creatinine urine ratio - EKG 12-Lead - TSH  3. Abnormal glucose  - Hemoglobin A1c - Insulin, random  4. Mixed hyperlipidemia -cont diet and exercise - Lipid panel  5. Vitamin D deficiency  - VITAMIN D 25 Hydroxy (Vit-D Deficiency, Fractures)  6. Medication management  - CBC with Differential/Platelet - BASIC METABOLIC PANEL WITH GFR - Hepatic function panel - Magnesium  7. Prostate cancer ([place])  - PSA   Discussed med's effects and SE's. Screening labs and tests as requested with regular follow-up as recommended.  HPI Patient presents for a complete physical.   His blood pressure has been controlled at home, today their BP is BP: 136/82 He does not workout. He denies chest pain, shortness of breath, dizziness.  He reports that he is playing tennis twice weekly and he is golfing once or twice per week.  He also goes to the [facility].  He is doing a combination of both cardio and weights.     He is on cholesterol medication and denies myalgias. His cholesterol is at goal. The cholesterol last visit was:   Lab Results  Component Value Date   CHOL 164 09/12/2015   HDL 92 09/12/2015   LDLCALC 63 09/12/2015   TRIG 47 09/12/2015   CHOLHDL 1.8 09/12/2015    He has been working on diet and exercise for prediabetes, he is on bASA, he is on ACE/ARB and denies foot ulcerations, hyperglycemia, hypoglycemia , increased appetite, nausea, paresthesia of the feet, polydipsia, polyuria, visual disturbances, vomiting and weight loss. Last A1C in the office was:  Lab Results  Component Value Date   HGBA1C 5.3 09/12/2015    Patient is on Vitamin D supplement.   Lab Results  Component Value Date   VD25OH 65 09/12/2015      Last PSA was: Lab  Results  Component Value Date   PSA <0.01 08/29/2013  .  Denies BPH symptoms daytime frequency, double voiding, dysuria, hematuria, hesitancy, incontinence, intermittency, nocturia, sensation of incomplete bladder emptying, suprapubic pain, urgency or weak urinary stream.  Current Medications:  Current Outpatient Prescriptions on File Prior to Visit  Medication Sig Dispense Refill  . ALPRAZolam (XANAX) 1 MG tablet TAKE ONE TABLET BY MOUTH ONCE DAILY AS NEEDED FOR SLEEP 90 tablet 0  . aspirin 325 MG tablet Take 1/2 tablet daily    . atenolol (TENORMIN) 50 MG tablet TAKE 1/2 TABLET AT BEDTIME FOR BLOOD PRESSURE 45 tablet 1  . Cholecalciferol (VITAMIN D3) 5000 UNITS CAPS Take 5,000 Int'l Units by mouth daily.    . diphenhydrAMINE (BENADRYL) 25 mg capsule Take 25 mg by mouth at bedtime as needed for sleep.    . fluocinonide cream (LIDEX) 0.05 %   0  . Glucosamine-Chondroit-Vit C-Mn (GLUCOSAMINE 1500 COMPLEX PO) Take by mouth daily.    Marland Kitchen losartan (COZAAR) 100 MG tablet TAKE 1 TABLET EVERY DAY FOR BLOOD PRESSURE 90 tablet 1  . meloxicam (MOBIC) 15 MG tablet Take 1 tablet (15 mg total) by mouth daily. 90 tablet 0  . valACYclovir (VALTREX) 500 MG tablet TAKE 1 TABLET EVERY DAY  FOR  FEVER  BLISTERS 90 tablet 1   No current facility-administered medications on file prior to visit.     Health Maintenance:  Immunization History  Administered Date(s) Administered  .  Influenza, High Dose Seasonal PF 01/31/2015  . MMR 02/23/2005  . Pneumococcal Conjugate-13 01/31/2015  . Pneumococcal Polysaccharide-23 02/23/2010  . Tdap 02/23/2005   Eye Exam: Dr. Lottie Dawson Dentist: Once yearly visits  Patient Care Team: Lucky Cowboy, MD as PCP - General (Internal Medicine) Heloise Purpura, MD as Consulting Physician (Urology) Louis Meckel, MD as Consulting Physician (Gastroenterology) Jene Every, MD as Consulting Physician (Orthopedic Surgery) Donzetta Starch, MD as Consulting Physician (Dermatology) Tilden Dome, MD as Referring Physician (Ophthalmology)  Allergies:  Allergies  Allergen Reactions  . Ultram [Tramadol] Anaphylaxis    Medical History:  Past Medical History:  Diagnosis Date  . Anxiety   . Hyperlipidemia   . Hypertension   . Other testicular hypofunction   . Vitamin D deficiency     Surgical History:  Past Surgical History:  Procedure Laterality Date  . COLONOSCOPY    . LYMPHADENECTOMY Bilateral 06/23/2012   Procedure: LYMPHADENECTOMY;  Surgeon: Crecencio Mc, MD;  Location: [facility];  Service: Urology;  Laterality: Bilateral;  . ROBOT ASSISTED LAPAROSCOPIC RADICAL PROSTATECTOMY N/A 06/23/2012   Procedure: ROBOTIC ASSISTED LAPAROSCOPIC RADICAL PROSTATECTOMY LEVEL 2;  Surgeon: Crecencio Mc, MD;  Location: [facility];  Service: Urology;  Laterality: N/A;    Family History:  Family History  Problem Relation Age of Onset  . Colon cancer Father   . Esophageal cancer Neg Hx   . Rectal cancer Neg Hx   . Stomach cancer Neg Hx     Social History:   Social History  Substance Use Topics  . Smoking status: Never Smoker  . Smokeless tobacco: Never Used  . Alcohol use 3.0 oz/week    5 Cans of beer per week     Comment: occ    Review of Systems:  Review of Systems  Constitutional: Negative for chills, fever and malaise/fatigue.  HENT: Negative for congestion, ear pain and sore throat.   Eyes: Negative.   Respiratory: Negative for cough, shortness of breath and wheezing.   Cardiovascular: Negative for chest pain, palpitations and leg swelling.  Gastrointestinal: Negative for abdominal pain, blood in stool, constipation, diarrhea, heartburn and melena.  Genitourinary: Negative.   Skin: Negative.   Neurological: Negative for dizziness, sensory change, loss of consciousness and headaches.  Psychiatric/Behavioral: Negative for depression. The patient is not nervous/anxious and does not have insomnia.     Physical Exam: Estimated body mass index is 26.24 kg/m as calculated  from the following:   Height as of this encounter: 6' 6.5" (1.994 m).   Weight as of this encounter: 230 lb (104.3 kg). BP 136/82   Pulse (!) 54   Temp 98.4 F (36.9 C) (Temporal)   Resp 16   Ht 6' 6.5" (1.994 m)   Wt 230 lb (104.3 kg)   BMI 26.24 kg/m   General Appearance: Well nourished, in no apparent distress.  Eyes: PERRLA, EOMs, conjunctiva no swelling or erythema ENT/Mouth: Ear canals clear bilaterally with no erythema, swelling, discharge.  TMs normal bilaterally with no erythema, bulging, or retractions.  Oropharynx clear and moist with no exudate, swelling, or erythema.  Dentition normal.   Neck: Supple, thyroid normal. No bruits, JVD, cervical adenopathy Respiratory: Respiratory effort normal, BS equal bilaterally without rales, rhonchi, wheezing or stridor.  Cardio: RRR without murmurs, rubs or gallops. Brisk peripheral pulses without edema.  Chest: symmetric, with normal excursions Abdomen: Soft, nontender, no guarding, rebound, hernias, masses, or organomegaly. Musculoskeletal: Full ROM all peripheral extremities,5/5 strength, and normal gait.  Skin: Warm, dry without  rashes, lesions, ecchymosis. Neuro: A&Ox3, Cranial nerves intact, reflexes equal bilaterally. Normal muscle tone, no cerebellar symptoms. Sensation intact.  Psych: Normal affect, Insight and Judgment appropriate.   EKG: WNL no changes.  Over 40 minutes of exam, counseling, chart review and critical decision making was performed  Starlyn Skeans 9:24 AM [facility]

## 2016-02-13 LAB — HEMOGLOBIN A1C
HEMOGLOBIN A1C: 4.9 % (ref <5.7)
Mean Plasma Glucose: 94 mg/dL

## 2016-02-13 LAB — INSULIN, RANDOM: INSULIN: 3 u[IU]/mL (ref 2.0–19.6)

## 2016-02-14 ENCOUNTER — Encounter: Payer: Self-pay | Admitting: Internal Medicine

## 2016-02-18 ENCOUNTER — Encounter: Payer: Self-pay | Admitting: Internal Medicine

## 2016-02-18 ENCOUNTER — Other Ambulatory Visit: Payer: Self-pay | Admitting: Internal Medicine

## 2016-02-27 ENCOUNTER — Other Ambulatory Visit: Payer: Self-pay | Admitting: *Deleted

## 2016-02-27 ENCOUNTER — Other Ambulatory Visit: Payer: Self-pay | Admitting: Internal Medicine

## 2016-02-27 DIAGNOSIS — I10 Essential (primary) hypertension: Secondary | ICD-10-CM

## 2016-02-27 MED ORDER — LOSARTAN POTASSIUM 100 MG PO TABS: ORAL_TABLET | ORAL | 1 refills | Status: DC

## 2016-03-02 ENCOUNTER — Other Ambulatory Visit: Payer: Self-pay | Admitting: Internal Medicine

## 2016-03-02 ENCOUNTER — Telehealth: Payer: Self-pay | Admitting: *Deleted

## 2016-03-02 MED ORDER — ALPRAZOLAM 1 MG PO TABS: ORAL_TABLET | ORAL | 0 refills | Status: DC

## 2016-03-02 NOTE — Telephone Encounter (Signed)
Patient requesting refill on Xanax 1 mg.  It appeared Xanax was phoned into [facility] mail order pharmacy 02/12/16.  I called [employer] and they state they did not receive that refill request.  The [facility] database was checked and the last fill date in the system was 08/2015.   A new refill was called into [facility] per patient's request.

## 2016-03-05 ENCOUNTER — Telehealth: Payer: Self-pay | Admitting: *Deleted

## 2016-03-05 NOTE — Telephone Encounter (Signed)
Patient called and reported that his BP has been elevated at 160/100 first thing in the AM.  Patient has a bottle of Atenolol left and asked if he should restart the medication.  Per Dr Melford Aase, restart the Atenolol at 1/2 tablet daily for 1 week and if the BP remains high, increase the Atenolol to a whole tablet.  Patient is aware of the instructions and will call back if the high BP continues.

## 2016-03-19 ENCOUNTER — Encounter: Payer: Self-pay | Admitting: Internal Medicine

## 2016-03-20 ENCOUNTER — Encounter: Payer: Self-pay | Admitting: Internal Medicine

## 2016-03-20 ENCOUNTER — Other Ambulatory Visit: Payer: Self-pay | Admitting: Internal Medicine

## 2016-03-20 DIAGNOSIS — Z7901 Long term (current) use of anticoagulants: Secondary | ICD-10-CM | POA: Diagnosis not present

## 2016-03-20 DIAGNOSIS — Z7982 Long term (current) use of aspirin: Secondary | ICD-10-CM | POA: Diagnosis not present

## 2016-03-20 DIAGNOSIS — I10 Essential (primary) hypertension: Secondary | ICD-10-CM | POA: Diagnosis not present

## 2016-03-20 DIAGNOSIS — I480 Paroxysmal atrial fibrillation: Secondary | ICD-10-CM | POA: Insufficient documentation

## 2016-03-20 DIAGNOSIS — E785 Hyperlipidemia, unspecified: Secondary | ICD-10-CM | POA: Diagnosis not present

## 2016-03-20 DIAGNOSIS — Z8546 Personal history of malignant neoplasm of prostate: Secondary | ICD-10-CM | POA: Diagnosis not present

## 2016-03-20 DIAGNOSIS — I4892 Unspecified atrial flutter: Secondary | ICD-10-CM | POA: Insufficient documentation

## 2016-03-20 DIAGNOSIS — I4891 Unspecified atrial fibrillation: Secondary | ICD-10-CM | POA: Diagnosis not present

## 2016-03-20 DIAGNOSIS — I080 Rheumatic disorders of both mitral and aortic valves: Secondary | ICD-10-CM | POA: Diagnosis not present

## 2016-03-20 DIAGNOSIS — Z79899 Other long term (current) drug therapy: Secondary | ICD-10-CM | POA: Diagnosis not present

## 2016-03-20 DIAGNOSIS — R079 Chest pain, unspecified: Secondary | ICD-10-CM | POA: Diagnosis not present

## 2016-03-20 DIAGNOSIS — R0789 Other chest pain: Secondary | ICD-10-CM | POA: Diagnosis not present

## 2016-03-20 DIAGNOSIS — I517 Cardiomegaly: Secondary | ICD-10-CM | POA: Diagnosis not present

## 2016-03-24 ENCOUNTER — Ambulatory Visit: Payer: Self-pay | Admitting: Internal Medicine

## 2016-03-30 ENCOUNTER — Other Ambulatory Visit: Payer: Self-pay

## 2016-03-30 ENCOUNTER — Encounter: Payer: Self-pay | Admitting: Internal Medicine

## 2016-03-30 ENCOUNTER — Ambulatory Visit (INDEPENDENT_AMBULATORY_CARE_PROVIDER_SITE_OTHER): Payer: Medicare Other | Admitting: Internal Medicine

## 2016-03-30 VITALS — BP 126/82 | HR 58 | Temp 98.4°F | Resp 18 | Ht 78.5 in | Wt 235.0 lb

## 2016-03-30 DIAGNOSIS — Z23 Encounter for immunization: Secondary | ICD-10-CM | POA: Diagnosis not present

## 2016-03-30 DIAGNOSIS — R809 Proteinuria, unspecified: Secondary | ICD-10-CM

## 2016-03-30 DIAGNOSIS — I4891 Unspecified atrial fibrillation: Secondary | ICD-10-CM

## 2016-03-30 NOTE — Progress Notes (Signed)
Assessment and Plan: Hospital visit follow up for new onset atrial fibrillation/flutter and chest pain.  Patient's catheterization was reviewed which was normal.  Discussed etiology of atrial fibrillation and flutter and cautioned on alcohol use and caffeine.  He is currently on eliquis BID.  We did increase atenolol to 100 mg and 100 mg of losartan.  Last microalbumin showed large amount of proteinuria.  Will recheck this today.  Patient is to follow-up with cardiology.  May have actually converted back to NSR as sounds very regular on exam but given follow-up on Friday will not get an EKG today.      Over 40 minutes of exam, counseling, chart review, and complex, moderate level critical decision making was performed this visit.   HPI 71 y.o.male presents for follow up from the hospital. Admit date to the hospital was 06/23/12, patient was discharged from the hospital on 06/24/12 and our office contacted the office the day after discharge to set up a follow up appointment, patient was admitted for: chest pressure and chest pain.  He reports that he got up on Friday and his blood pressure was really high.  He went to the ER and had a heart catheterization.  He had a normal heart cath.  He has no blockages.  He reports that he was diagnosed with atrial fibrillation and flutter.  They did given him 3 SL nitroglycerin in the hopsital.  He does have a visit with Dr. Wyline Copas on Friday.  He has been symptom free since going home. He is up to 100 mg of atenolol and 100 mg of losartan.  He is currently drinking somewhere around 3 beers per night.  Does not drink soda but does drink tea frequenty.  He has minimal other sources of caffeine.  He is not taking otc medications.  He is still exercising.  He has had no problems with the heart catherization cannula site at his right wrist.  He is aware to call the office if he has trouble with his eliquis involving bleeding, trauma to the head or abdomen.    Images while in the  hospital: Dg Chest 2 View  Result Date: 06/07/2012 *RADIOLOGY REPORT* Clinical Data: Preoperative evaluation.  History of hypertension. For prostatectomy. CHEST - 2 VIEW Comparison: 12/28/2005.  07/09/2009. Findings: Cardiac silhouette is normal size and shape. Ectasia and tortuosity of the thoracic aorta are seen. Cardiac silhouette is upper range of normal size.  Mediastinal and hilar contours appear stable.  No pulmonary infiltrates or masses are seen.  No pleural effusion is evident.  Osteophytes are seen in the spine.  Decreased height of a lower thoracic vertebral body which is chronic may reflect old trauma or old Scheuermann disease. IMPRESSION: No acute or active cardiopulmonary or pleural abnormalities are evident.  Stable chronic findings are described above. Original Report Authenticated By: Shanon Brow Call    Past Medical History:  Diagnosis Date  . Anxiety   . Hyperlipidemia   . Hypertension   . Other testicular hypofunction   . Vitamin D deficiency      Allergies  Allergen Reactions  . Ultram [Tramadol] Anaphylaxis      Current Outpatient Prescriptions on File Prior to Visit  Medication Sig Dispense Refill  . ALPRAZolam (XANAX) 1 MG tablet TAKE ONE TABLET BY MOUTH ONCE DAILY AS NEEDED FOR SLEEP 90 tablet 0  . aspirin 325 MG tablet Take 1/2 tablet daily    . Cholecalciferol (VITAMIN D3) 5000 UNITS CAPS Take 5,000 Int'l Units by mouth  daily.    . diphenhydrAMINE (BENADRYL) 25 mg capsule Take 25 mg by mouth at bedtime as needed for sleep.    . fluocinonide cream (LIDEX) 0.05 %   0  . Glucosamine-Chondroit-Vit C-Mn (GLUCOSAMINE 1500 COMPLEX PO) Take by mouth daily.    Marland Kitchen losartan (COZAAR) 100 MG tablet TAKE 1 TABLET EVERY DAY FOR BLOOD PRESSURE 90 tablet 1  . meloxicam (MOBIC) 15 MG tablet Take 1 tablet (15 mg total) by mouth daily. 90 tablet 0  . simvastatin (ZOCOR) 80 MG tablet TAKE 1 TABLET (80 MG TOTAL) BY MOUTH DAILY. 90 tablet 1  . valACYclovir (VALTREX) 500 MG tablet TAKE  1 TABLET EVERY DAY  FOR  FEVER  BLISTERS 90 tablet 1   No current facility-administered medications on file prior to visit.    Review of Systems  Constitutional: Negative for chills, fever and malaise/fatigue.  Respiratory: Negative for cough, sputum production, shortness of breath and wheezing.   Cardiovascular: Negative for chest pain, palpitations and leg swelling.  Gastrointestinal: Negative for blood in stool and melena.  Genitourinary: Negative.   Neurological: Negative for dizziness, sensory change, loss of consciousness and headaches.     Physical Exam: Filed Weights   03/30/16 0925  Weight: 235 lb (106.6 kg)   BP 126/82   Pulse (!) 58   Temp 98.4 F (36.9 C) (Temporal)   Resp 18   Ht 6' 6.5" (1.994 m)   Wt 235 lb (106.6 kg)   BMI 26.81 kg/m  General Appearance: Well nourished, in no apparent distress. Eyes: PERRLA, EOMs, conjunctiva no swelling or erythema Sinuses: No Frontal/maxillary tenderness ENT/Mouth: Ext aud canals clear, TMs without erythema, bulging. No erythema, swelling, or exudate on post pharynx.  Tonsils not swollen or erythematous. Hearing normal.  Neck: Supple, thyroid normal.  Respiratory: Respiratory effort normal, BS equal bilaterally without rales, rhonchi, wheezing or stridor.  Cardio: RRR with no MRGs. Brisk peripheral pulses without edema.  Abdomen: Soft, + BS.  Non tender, no guarding, rebound, hernias, masses. Lymphatics: Non tender without lymphadenopathy.  Musculoskeletal: Full ROM, 5/5 strength, normal gait.  Skin: Warm, dry without rashes, lesions, ecchymosis.  Neuro: Cranial nerves intact. Normal muscle tone, no cerebellar symptoms. Sensation intact.  Psych: Awake and oriented X 3, normal affect, Insight and Judgment appropriate.     Starlyn Skeans, PA-C 9:57 AM [facility]

## 2016-03-31 LAB — MICROALBUMIN / CREATININE URINE RATIO
CREATININE, URINE: 185 mg/dL (ref 20–370)
MICROALB UR: 10.4 mg/dL
Microalb Creat Ratio: 56 mcg/mg creat — ABNORMAL HIGH (ref <30)

## 2016-04-01 DIAGNOSIS — H40003 Preglaucoma, unspecified, bilateral: Secondary | ICD-10-CM | POA: Diagnosis not present

## 2016-04-01 DIAGNOSIS — H2513 Age-related nuclear cataract, bilateral: Secondary | ICD-10-CM | POA: Diagnosis not present

## 2016-04-03 DIAGNOSIS — I4892 Unspecified atrial flutter: Secondary | ICD-10-CM | POA: Diagnosis not present

## 2016-04-03 DIAGNOSIS — I4891 Unspecified atrial fibrillation: Secondary | ICD-10-CM | POA: Diagnosis not present

## 2016-04-03 DIAGNOSIS — E782 Mixed hyperlipidemia: Secondary | ICD-10-CM | POA: Diagnosis not present

## 2016-04-03 DIAGNOSIS — R0789 Other chest pain: Secondary | ICD-10-CM | POA: Diagnosis not present

## 2016-04-03 DIAGNOSIS — I10 Essential (primary) hypertension: Secondary | ICD-10-CM | POA: Diagnosis not present

## 2016-04-03 DIAGNOSIS — G4733 Obstructive sleep apnea (adult) (pediatric): Secondary | ICD-10-CM | POA: Diagnosis not present

## 2016-04-06 ENCOUNTER — Other Ambulatory Visit: Payer: Self-pay | Admitting: Internal Medicine

## 2016-04-06 MED ORDER — ATENOLOL 50 MG PO TABS: 100.0000 mg | ORAL_TABLET | Freq: Every day | ORAL | 1 refills | Status: DC

## 2016-04-20 DIAGNOSIS — I4891 Unspecified atrial fibrillation: Secondary | ICD-10-CM | POA: Diagnosis not present

## 2016-04-27 DIAGNOSIS — I10 Essential (primary) hypertension: Secondary | ICD-10-CM | POA: Diagnosis not present

## 2016-04-27 DIAGNOSIS — Z79899 Other long term (current) drug therapy: Secondary | ICD-10-CM | POA: Diagnosis not present

## 2016-04-27 DIAGNOSIS — R001 Bradycardia, unspecified: Secondary | ICD-10-CM | POA: Diagnosis not present

## 2016-04-27 DIAGNOSIS — G4730 Sleep apnea, unspecified: Secondary | ICD-10-CM | POA: Diagnosis not present

## 2016-04-27 DIAGNOSIS — I480 Paroxysmal atrial fibrillation: Secondary | ICD-10-CM | POA: Insufficient documentation

## 2016-04-27 DIAGNOSIS — I4891 Unspecified atrial fibrillation: Secondary | ICD-10-CM | POA: Diagnosis not present

## 2016-04-27 DIAGNOSIS — I440 Atrioventricular block, first degree: Secondary | ICD-10-CM | POA: Diagnosis not present

## 2016-04-27 DIAGNOSIS — E785 Hyperlipidemia, unspecified: Secondary | ICD-10-CM | POA: Diagnosis not present

## 2016-04-27 DIAGNOSIS — Z7901 Long term (current) use of anticoagulants: Secondary | ICD-10-CM | POA: Diagnosis not present

## 2016-05-27 ENCOUNTER — Encounter: Payer: Self-pay | Admitting: *Deleted

## 2016-06-12 DIAGNOSIS — I480 Paroxysmal atrial fibrillation: Secondary | ICD-10-CM | POA: Diagnosis not present

## 2016-06-12 DIAGNOSIS — I10 Essential (primary) hypertension: Secondary | ICD-10-CM | POA: Diagnosis not present

## 2016-06-12 DIAGNOSIS — E782 Mixed hyperlipidemia: Secondary | ICD-10-CM | POA: Diagnosis not present

## 2016-06-12 DIAGNOSIS — I4891 Unspecified atrial fibrillation: Secondary | ICD-10-CM | POA: Diagnosis not present

## 2016-06-12 DIAGNOSIS — G4733 Obstructive sleep apnea (adult) (pediatric): Secondary | ICD-10-CM | POA: Diagnosis not present

## 2016-07-07 ENCOUNTER — Other Ambulatory Visit: Payer: Self-pay | Admitting: Internal Medicine

## 2016-07-07 DIAGNOSIS — I10 Essential (primary) hypertension: Secondary | ICD-10-CM

## 2016-07-07 DIAGNOSIS — Z79899 Other long term (current) drug therapy: Secondary | ICD-10-CM

## 2016-07-07 DIAGNOSIS — E782 Mixed hyperlipidemia: Secondary | ICD-10-CM

## 2016-07-07 DIAGNOSIS — E559 Vitamin D deficiency, unspecified: Secondary | ICD-10-CM

## 2016-07-07 DIAGNOSIS — R7309 Other abnormal glucose: Secondary | ICD-10-CM

## 2016-07-08 ENCOUNTER — Other Ambulatory Visit: Payer: Medicare Other

## 2016-07-08 ENCOUNTER — Other Ambulatory Visit: Payer: Self-pay | Admitting: Internal Medicine

## 2016-07-08 DIAGNOSIS — Z79899 Other long term (current) drug therapy: Secondary | ICD-10-CM | POA: Diagnosis not present

## 2016-07-08 DIAGNOSIS — C61 Malignant neoplasm of prostate: Secondary | ICD-10-CM

## 2016-07-08 DIAGNOSIS — R972 Elevated prostate specific antigen [PSA]: Secondary | ICD-10-CM

## 2016-07-08 DIAGNOSIS — N320 Bladder-neck obstruction: Secondary | ICD-10-CM | POA: Diagnosis not present

## 2016-07-08 DIAGNOSIS — I10 Essential (primary) hypertension: Secondary | ICD-10-CM

## 2016-07-08 DIAGNOSIS — E559 Vitamin D deficiency, unspecified: Secondary | ICD-10-CM

## 2016-07-08 DIAGNOSIS — R7309 Other abnormal glucose: Secondary | ICD-10-CM

## 2016-07-08 DIAGNOSIS — E782 Mixed hyperlipidemia: Secondary | ICD-10-CM | POA: Diagnosis not present

## 2016-07-08 LAB — BASIC METABOLIC PANEL WITH GFR
BUN: 15 mg/dL (ref 7–25)
CALCIUM: 8.6 mg/dL (ref 8.6–10.3)
CO2: 25 mmol/L (ref 20–31)
Chloride: 106 mmol/L (ref 98–110)
Creat: 1.09 mg/dL (ref 0.70–1.18)
GFR, EST NON AFRICAN AMERICAN: 68 mL/min (ref >=60)
GFR, Est African American: 79 mL/min (ref >=60)
GLUCOSE: 81 mg/dL (ref 65–99)
Potassium: 3.9 mmol/L (ref 3.5–5.3)
Sodium: 141 mmol/L (ref 135–146)

## 2016-07-08 LAB — LIPID PANEL
CHOLESTEROL: 180 mg/dL (ref <200)
HDL: 76 mg/dL (ref >40)
LDL Cholesterol: 92 mg/dL (ref <100)
Total CHOL/HDL Ratio: 2.4 Ratio (ref <5.0)
Triglycerides: 62 mg/dL (ref <150)
VLDL: 12 mg/dL (ref <30)

## 2016-07-08 LAB — CBC WITH DIFFERENTIAL/PLATELET
BASOS ABS: 78 {cells}/uL (ref 0–200)
Basophils Relative: 2 %
EOS ABS: 273 {cells}/uL (ref 15–500)
Eosinophils Relative: 7 %
HEMATOCRIT: 41.2 % (ref 38.5–50.0)
HEMOGLOBIN: 13.9 g/dL (ref 13.2–17.1)
Lymphocytes Relative: 38 %
Lymphs Abs: 1482 cells/uL (ref 850–3900)
MCH: 33.3 pg — AB (ref 27.0–33.0)
MCHC: 33.7 g/dL (ref 32.0–36.0)
MCV: 98.6 fL (ref 80.0–100.0)
MONO ABS: 507 {cells}/uL (ref 200–950)
MPV: 8.8 fL (ref 7.5–12.5)
Monocytes Relative: 13 %
NEUTROS PCT: 40 %
Neutro Abs: 1560 cells/uL (ref 1500–7800)
Platelets: 200 10*3/uL (ref 140–400)
RBC: 4.18 MIL/uL — AB (ref 4.20–5.80)
RDW: 13.5 % (ref 11.0–15.0)
WBC: 3.9 10*3/uL (ref 3.8–10.8)

## 2016-07-08 LAB — HEPATIC FUNCTION PANEL
ALBUMIN: 4 g/dL (ref 3.6–5.1)
ALT: 16 U/L (ref 9–46)
AST: 25 U/L (ref 10–35)
Alkaline Phosphatase: 65 U/L (ref 40–115)
Bilirubin, Direct: 0.5 mg/dL — ABNORMAL HIGH (ref <=0.2)
Indirect Bilirubin: 2.3 mg/dL — ABNORMAL HIGH (ref 0.2–1.2)
Total Bilirubin: 2.8 mg/dL — ABNORMAL HIGH (ref 0.2–1.2)
Total Protein: 6.6 g/dL (ref 6.1–8.1)

## 2016-07-08 LAB — TSH: TSH: 2.42 m[IU]/L (ref 0.40–4.50)

## 2016-07-08 LAB — MAGNESIUM: MAGNESIUM: 2.1 mg/dL (ref 1.5–2.5)

## 2016-07-09 ENCOUNTER — Other Ambulatory Visit: Payer: Self-pay | Admitting: Internal Medicine

## 2016-07-09 LAB — PSA: PSA: <0.1 ng/mL (ref <=4.0)

## 2016-07-09 LAB — HEMOGLOBIN A1C
Hgb A1c MFr Bld: 5 % (ref <5.7)
Mean Plasma Glucose: 97 mg/dL

## 2016-07-09 LAB — INSULIN, RANDOM: INSULIN: 3.8 u[IU]/mL (ref 2.0–19.6)

## 2016-07-09 LAB — VITAMIN D 25 HYDROXY (VIT D DEFICIENCY, FRACTURES): VIT D 25 HYDROXY: 67 ng/mL (ref 30–100)

## 2016-07-14 ENCOUNTER — Other Ambulatory Visit: Payer: Self-pay | Admitting: Internal Medicine

## 2016-07-14 DIAGNOSIS — N5201 Erectile dysfunction due to arterial insufficiency: Secondary | ICD-10-CM | POA: Diagnosis not present

## 2016-07-14 DIAGNOSIS — C61 Malignant neoplasm of prostate: Secondary | ICD-10-CM | POA: Diagnosis not present

## 2016-07-14 DIAGNOSIS — G4733 Obstructive sleep apnea (adult) (pediatric): Secondary | ICD-10-CM | POA: Diagnosis not present

## 2016-07-14 NOTE — Telephone Encounter (Signed)
Please call Alpraz  

## 2016-07-27 DIAGNOSIS — I480 Paroxysmal atrial fibrillation: Secondary | ICD-10-CM | POA: Diagnosis not present

## 2016-07-27 DIAGNOSIS — I10 Essential (primary) hypertension: Secondary | ICD-10-CM | POA: Diagnosis not present

## 2016-07-27 DIAGNOSIS — Z8679 Personal history of other diseases of the circulatory system: Secondary | ICD-10-CM | POA: Diagnosis not present

## 2016-07-27 DIAGNOSIS — E782 Mixed hyperlipidemia: Secondary | ICD-10-CM | POA: Diagnosis not present

## 2016-07-27 HISTORY — DX: Personal history of other diseases of the circulatory system: Z86.79

## 2016-07-31 ENCOUNTER — Other Ambulatory Visit: Payer: Self-pay | Admitting: Internal Medicine

## 2016-07-31 DIAGNOSIS — I10 Essential (primary) hypertension: Secondary | ICD-10-CM

## 2016-08-19 ENCOUNTER — Ambulatory Visit: Payer: Self-pay | Admitting: Internal Medicine

## 2016-12-15 DIAGNOSIS — Z23 Encounter for immunization: Secondary | ICD-10-CM | POA: Diagnosis not present

## 2016-12-18 ENCOUNTER — Other Ambulatory Visit: Payer: Self-pay | Admitting: Internal Medicine

## 2016-12-21 DIAGNOSIS — H40003 Preglaucoma, unspecified, bilateral: Secondary | ICD-10-CM | POA: Diagnosis not present

## 2017-01-02 ENCOUNTER — Other Ambulatory Visit: Payer: Self-pay | Admitting: Internal Medicine

## 2017-01-02 DIAGNOSIS — I10 Essential (primary) hypertension: Secondary | ICD-10-CM

## 2017-01-07 ENCOUNTER — Other Ambulatory Visit: Payer: Self-pay | Admitting: Internal Medicine

## 2017-01-07 ENCOUNTER — Other Ambulatory Visit: Payer: Self-pay

## 2017-01-07 DIAGNOSIS — I10 Essential (primary) hypertension: Secondary | ICD-10-CM

## 2017-01-07 MED ORDER — ATENOLOL 25 MG PO TABS: ORAL_TABLET | ORAL | 0 refills | Status: DC

## 2017-01-07 MED ORDER — ATENOLOL 25 MG PO TABS: ORAL_TABLET | ORAL | 1 refills | Status: DC

## 2017-01-25 DIAGNOSIS — H40003 Preglaucoma, unspecified, bilateral: Secondary | ICD-10-CM | POA: Diagnosis not present

## 2017-01-27 DIAGNOSIS — Z8679 Personal history of other diseases of the circulatory system: Secondary | ICD-10-CM | POA: Insufficient documentation

## 2017-01-27 DIAGNOSIS — E785 Hyperlipidemia, unspecified: Secondary | ICD-10-CM | POA: Insufficient documentation

## 2017-01-27 DIAGNOSIS — I10 Essential (primary) hypertension: Secondary | ICD-10-CM | POA: Diagnosis not present

## 2017-01-27 DIAGNOSIS — I481 Persistent atrial fibrillation: Secondary | ICD-10-CM | POA: Diagnosis not present

## 2017-01-29 DIAGNOSIS — D2339 Other benign neoplasm of skin of other parts of face: Secondary | ICD-10-CM | POA: Diagnosis not present

## 2017-01-29 DIAGNOSIS — L918 Other hypertrophic disorders of the skin: Secondary | ICD-10-CM | POA: Diagnosis not present

## 2017-01-29 DIAGNOSIS — D485 Neoplasm of uncertain behavior of skin: Secondary | ICD-10-CM | POA: Diagnosis not present

## 2017-01-29 DIAGNOSIS — L812 Freckles: Secondary | ICD-10-CM | POA: Diagnosis not present

## 2017-01-29 DIAGNOSIS — L570 Actinic keratosis: Secondary | ICD-10-CM | POA: Diagnosis not present

## 2017-01-29 DIAGNOSIS — D1801 Hemangioma of skin and subcutaneous tissue: Secondary | ICD-10-CM | POA: Diagnosis not present

## 2017-01-29 DIAGNOSIS — L821 Other seborrheic keratosis: Secondary | ICD-10-CM | POA: Diagnosis not present

## 2017-02-11 ENCOUNTER — Encounter: Payer: Self-pay | Admitting: Internal Medicine

## 2017-02-11 ENCOUNTER — Ambulatory Visit (INDEPENDENT_AMBULATORY_CARE_PROVIDER_SITE_OTHER): Payer: Medicare Other | Admitting: Internal Medicine

## 2017-02-11 VITALS — BP 124/84 | HR 76 | Temp 97.0°F | Resp 18 | Ht 78.5 in | Wt 238.4 lb

## 2017-02-11 DIAGNOSIS — I4892 Unspecified atrial flutter: Secondary | ICD-10-CM

## 2017-02-11 DIAGNOSIS — Z136 Encounter for screening for cardiovascular disorders: Secondary | ICD-10-CM

## 2017-02-11 DIAGNOSIS — E559 Vitamin D deficiency, unspecified: Secondary | ICD-10-CM | POA: Diagnosis not present

## 2017-02-11 DIAGNOSIS — R7309 Other abnormal glucose: Secondary | ICD-10-CM

## 2017-02-11 DIAGNOSIS — I10 Essential (primary) hypertension: Secondary | ICD-10-CM

## 2017-02-11 DIAGNOSIS — Z1212 Encounter for screening for malignant neoplasm of rectum: Secondary | ICD-10-CM

## 2017-02-11 DIAGNOSIS — C61 Malignant neoplasm of prostate: Secondary | ICD-10-CM

## 2017-02-11 DIAGNOSIS — I4891 Unspecified atrial fibrillation: Secondary | ICD-10-CM

## 2017-02-11 DIAGNOSIS — Z8546 Personal history of malignant neoplasm of prostate: Secondary | ICD-10-CM

## 2017-02-11 DIAGNOSIS — Z1211 Encounter for screening for malignant neoplasm of colon: Secondary | ICD-10-CM

## 2017-02-11 DIAGNOSIS — E782 Mixed hyperlipidemia: Secondary | ICD-10-CM

## 2017-02-11 DIAGNOSIS — Z79899 Other long term (current) drug therapy: Secondary | ICD-10-CM | POA: Diagnosis not present

## 2017-02-11 NOTE — Progress Notes (Signed)
[facility]   Unk Russell, M.D.     Uvaldo Bristle. Silverio Lay, P.A.-C Liane Comber, [place]                [address] [place], [place] [address] Telephone [phone] Telefax [phone] Annual  Screening/Preventative Visit  & Comprehensive Evaluation & Examination     This very nice 71 y.o. MWM presents for a Screening/Preventative Visit & comprehensive evaluation and management of multiple medical co-morbidities.  Patient has been followed for HTN, T2_NIDDM  Prediabetes, Hyperlipidemia and Vitamin D Deficiency. Patient is post Radical Prostatectomy in 2014 by Dr Dutch Gray.     HTN predates since 2006 . Patient's BP has been controlled at home.  Today's BP is sl elevated at 120/88 & rechecked at 124/84. Patient has remote hx/o [facility] in 1988 w/o sequelae.  In 2007 , he had a Negative Cardiolite Test. In Jan 2018 with presentation of new AFib/Flutter, he had a Normal Heart Cath by Dr. Wyline [facility]as in [facility]. Then in Mar 2018, he failed Cardioversion and has since been on Eliquis & Fleconide followed by Dr Wyline Copas. denies any cardiac symptoms as chest pain, palpitations, shortness of breath, dizziness or ankle swelling.     Patient's hyperlipidemia is controlled with diet and medications. Patient denies myalgias or other medication SE's. Last lipids were at goal: Lab Results  Component Value Date   CHOL 161 02/11/2017   HDL 79 02/11/2017   LDLCALC 92 07/08/2016   TRIG 46 02/11/2017   CHOLHDL 2.0 02/11/2017      Patient has hx/o abn glucose and is proactively screened for prediabetes and patient denies reactive hypoglycemic symptoms, visual blurring, diabetic polys or paresthesias. Last A1c was at goal: Lab Results  Component Value Date   HGBA1C 5.1 02/11/2017       Finally, patient has history of Vitamin D Deficiency ("24" / 2008) and last vitamin D was at goal: Lab Results  Component Value Date   VD25OH 69 02/11/2017   Current Outpatient Medications on File Prior to Visit  Medication Sig  . apixaban (ELIQUIS) 5 MG TABS tablet Take 5 mg by mouth 2 (two) times daily.  Marland Kitchen atenolol (TENORMIN) 25 MG tablet Take 1 to 4  tablets daily as directed for BP  . Cholecalciferol (VITAMIN D3) 5000 UNITS CAPS Take 5,000 Int'l Units by mouth daily.  . diphenhydrAMINE (BENADRYL) 25 mg capsule Take 25 mg by mouth at bedtime as needed for sleep.  . Glucosamine-Chondroit-Vit C-Mn (GLUCOSAMINE 1500 COMPLEX PO) Take by mouth daily.  Marland Kitchen losartan-hydrochlorothiazide (HYZAAR) 100-25 MG tablet Take 1 tablet by mouth daily.  . simvastatin (ZOCOR) 80 MG tablet TAKE 1 TABLET EVERY DAY  . valACYclovir (VALTREX) 500 MG tablet TAKE 1 TABLET EVERY DAY  FOR  FEVER  BLISTERS AS DIRECTED   No current facility-administered medications on file prior to visit.    Allergies  Allergen Reactions  . Ultram [Tramadol] Anaphylaxis   Past Medical History:  Diagnosis Date  . Anxiety   . Hyperlipidemia   . Hypertension   . Other testicular hypofunction   . Vitamin D deficiency    Health Maintenance  Topic Date Due  . COLONOSCOPY  03/17/2022  . INFLUENZA VACCINE  Completed  . Hepatitis C Screening  Completed  . PNA vac Low Risk Adult  Completed  . TETANUS/TDAP  Discontinued   Immunization History  Administered Date(s) Administered  . Influenza, High Dose Seasonal PF 01/31/2015  . Influenza-Unspecified 12/24/2016  . MMR 02/23/2005  . Pneumococcal Conjugate-13 01/31/2015  . Pneumococcal Polysaccharide-23 02/23/2010, 03/30/2016  . Tdap 02/23/2005   Last Colon -  Past Surgical History:  Procedure Laterality Date  . COLONOSCOPY    . LYMPHADENECTOMY Bilateral 06/23/2012   Procedure: LYMPHADENECTOMY;  Surgeon: Dutch Gray, MD;  Location: [facility];  Service: Urology;  Laterality: Bilateral;  . ROBOT ASSISTED LAPAROSCOPIC RADICAL PROSTATECTOMY N/A 06/23/2012   Procedure: ROBOTIC ASSISTED LAPAROSCOPIC RADICAL PROSTATECTOMY  LEVEL 2;  Surgeon: Dutch Gray, MD;  Location: [facility];  Service: Urology;  Laterality: N/A;   Family History  Problem Relation Age of Onset  . Colon cancer Father   . Esophageal cancer Neg Hx   . Rectal cancer Neg Hx   . Stomach cancer Neg Hx    Social History   Socioeconomic History  . Marital status: Married    Spouse name: Mardene Celeste  . Number of children: 3  . Not on file  Tobacco Use  . Smoking status: Never Smoker  . Smokeless tobacco: Never Used  Substance and Sexual Activity  . Alcohol use: Yes    Alcohol/week: 3.0 oz    Types: 5 Cans of beer per week    Comment: occ  . Drug use: No  . Sexual activity: Not on file    ROS Constitutional: Denies fever, chills, weight loss/gain, headaches, insomnia,  night sweats or change in appetite. Does c/o fatigue. Eyes: Denies redness, blurred vision, diplopia, discharge, itchy or watery eyes.  ENT: Denies discharge, congestion, post nasal drip, epistaxis, sore throat, earache, hearing loss, dental pain, Tinnitus, Vertigo, Sinus pain or snoring.  Cardio: Denies chest pain, palpitations, irregular heartbeat, syncope, dyspnea, diaphoresis, orthopnea, PND, claudication or edema Respiratory: denies cough, dyspnea, DOE, pleurisy, hoarseness, laryngitis or wheezing.  Gastrointestinal: Denies dysphagia, heartburn, reflux, water brash, pain, cramps, nausea, vomiting, bloating, diarrhea, constipation, hematemesis, melena, hematochezia, jaundice or hemorrhoids Genitourinary: Denies dysuria, frequency, urgency, nocturia, hesitancy, discharge, hematuria or flank pain Musculoskeletal: Denies arthralgia, myalgia, stiffness, Jt. Swelling, pain, limp or strain/sprain. Denies Falls. Skin: Denies puritis, rash, hives, warts, acne, eczema or change in skin lesion Neuro: No weakness, tremor, incoordination, spasms, paresthesia or pain Psychiatric: Denies confusion, memory loss or sensory loss. Denies Depression. Endocrine: Denies change in weight, skin,  hair change, nocturia, and paresthesia, diabetic polys, visual blurring or hyper / hypo glycemic episodes.  Heme/Lymph: No excessive bleeding, bruising or enlarged lymph nodes.  Physical Exam  BP 124/84   Pulse 76   Temp (!) 97 F (36.1 C)   Resp 18   Ht 6' 6.5" (1.994 m)   Wt 238 lb 6.4 oz (108.1 kg)   BMI 27.20 kg/m   General Appearance: Well nourished and well groomed and in no apparent distress.  Eyes: PERRLA, EOMs, conjunctiva no swelling or erythema, normal fundi and vessels. Sinuses: No frontal/maxillary tenderness ENT/Mouth: EACs patent / TMs  nl. Nares clear without erythema, swelling, mucoid exudates. Oral hygiene is good. No erythema, swelling, or exudate. Tongue normal, non-obstructing. Tonsils not swollen or erythematous. Hearing normal.  Neck: Supple, thyroid normal. No bruits, nodes or JVD. Respiratory: Respiratory effort normal.  BS equal and clear bilateral without rales, rhonci, wheezing or stridor. Cardio: Heart sounds are normal with regular rate and rhythm and no murmurs, rubs or gallops. Peripheral pulses are normal and equal bilaterally without edema. No aortic or femoral bruits. Chest: symmetric with normal excursions and percussion.  Abdomen: Soft,  with Nl bowel sounds. Nontender, no guarding, rebound, hernias, masses, or organomegaly.  Lymphatics: Non tender without lymphadenopathy.  Genitourinary: No hernias.Testes nl. DRE - prostate nl for age - smooth & firm w/o nodules. Musculoskeletal: Full ROM all peripheral extremities, joint stability, 5/5 strength, and normal gait. Skin: Warm and dry without rashes, lesions, cyanosis, clubbing or  ecchymosis.  Neuro: Cranial nerves intact, reflexes equal bilaterally. Normal muscle tone, no cerebellar symptoms. Sensation intact.  Pysch: Alert and oriented X 3 with normal affect, insight and judgment appropriate.   Assessment and Plan  1. Essential hypertension  - [place], RETROPERITNL ABD,  LTD - Urinalysis, Routine w  reflex microscopic - Microalbumin / creatinine urine ratio - CBC with Differential/Platelet - BASIC METABOLIC PANEL WITH GFR - Magnesium - TSH  2. Mixed hyperlipidemia  - [place], RETROPERITNL ABD,  LTD - Hepatic function panel - Lipid panel - TSH  3. Abnormal glucose  - Hemoglobin A1c - Insulin, random  4. Vitamin D deficiency  - VITAMIN D 25 Hydroxy   5. Atrial fibrillation and flutter (HCC)  - TSH  6. Screening for colorectal cancer   7. Screening for AAA (aortic abdominal aneurysm)  - [place], RETROPERITNL ABD,  LTD  8. History of prostate cancer   9. Medication management  - Urinalysis, Routine w reflex microscopic - Microalbumin / creatinine urine ratio - CBC with Differential/Platelet - BASIC METABOLIC PANEL WITH GFR - Hepatic function panel - Magnesium - Lipid panel - TSH - Hemoglobin A1c - Insulin, random - VITAMIN D 25 Hydroxy       Patient was counseled in prudent diet, weight control to achieve/maintain BMI less than 25, BP monitoring, regular exercise and medications as discussed.  Discussed med effects and SE's. Routine screening labs and tests as requested with regular follow-up as recommended. Over 40 minutes of exam, counseling, chart review and high complex critical decision making was performed

## 2017-02-11 NOTE — Patient Instructions (Signed)

## 2017-02-12 LAB — URINALYSIS, ROUTINE W REFLEX MICROSCOPIC
Bilirubin Urine: NEGATIVE
GLUCOSE, UA: NEGATIVE
Hgb urine dipstick: NEGATIVE
Ketones, ur: NEGATIVE
LEUKOCYTES UA: NEGATIVE
NITRITE: NEGATIVE
PH: 6 (ref 5.0–8.0)
Protein, ur: NEGATIVE
SPECIFIC GRAVITY, URINE: 1.017 (ref 1.001–1.03)

## 2017-02-12 LAB — CBC WITH DIFFERENTIAL/PLATELET
Basophils Absolute: 90 {cells}/uL (ref 0–200)
Basophils Relative: 2.3 %
Eosinophils Absolute: 218 {cells}/uL (ref 15–500)
Eosinophils Relative: 5.6 %
HCT: 41.7 % (ref 38.5–50.0)
Hemoglobin: 14.4 g/dL (ref 13.2–17.1)
Lymphs Abs: 1373 {cells}/uL (ref 850–3900)
MCH: 33.7 pg — ABNORMAL HIGH (ref 27.0–33.0)
MCHC: 34.5 g/dL (ref 32.0–36.0)
MCV: 97.7 fL (ref 80.0–100.0)
MPV: 9.8 fL (ref 7.5–12.5)
Monocytes Relative: 10.5 %
Neutro Abs: 1810 {cells}/uL (ref 1500–7800)
Neutrophils Relative %: 46.4 %
Platelets: 197 Thousand/uL (ref 140–400)
RBC: 4.27 Million/uL (ref 4.20–5.80)
RDW: 12 % (ref 11.0–15.0)
Total Lymphocyte: 35.2 %
WBC mixed population: 410 {cells}/uL (ref 200–950)
WBC: 3.9 Thousand/uL (ref 3.8–10.8)

## 2017-02-12 LAB — LIPID PANEL
Cholesterol: 161 mg/dL (ref <200)
HDL: 79 mg/dL (ref >40)
LDL CHOLESTEROL (CALC): 69 mg/dL
Non-HDL Cholesterol (Calc): 82 mg/dL (calc) (ref <130)
Total CHOL/HDL Ratio: 2 (calc) (ref <5.0)
Triglycerides: 46 mg/dL (ref <150)

## 2017-02-12 LAB — HEMOGLOBIN A1C
Hgb A1c MFr Bld: 5.1 % of total Hgb (ref <5.7)
Mean Plasma Glucose: 100 (calc)
eAG (mmol/L): 5.5 (calc)

## 2017-02-12 LAB — BASIC METABOLIC PANEL WITH GFR
BUN / CREAT RATIO: 14 (calc) (ref 6–22)
BUN: 18 mg/dL (ref 7–25)
CHLORIDE: 105 mmol/L (ref 98–110)
CO2: 28 mmol/L (ref 20–32)
Calcium: 9.3 mg/dL (ref 8.6–10.3)
Creat: 1.26 mg/dL — ABNORMAL HIGH (ref 0.70–1.18)
GFR, EST AFRICAN AMERICAN: 66 mL/min/{1.73_m2} (ref >=60)
GFR, Est Non African American: 57 mL/min/{1.73_m2} — ABNORMAL LOW (ref >=60)
GLUCOSE: 90 mg/dL (ref 65–99)
POTASSIUM: 4.8 mmol/L (ref 3.5–5.3)
Sodium: 141 mmol/L (ref 135–146)

## 2017-02-12 LAB — HEPATIC FUNCTION PANEL
AG Ratio: 1.4 (calc) (ref 1.0–2.5)
ALT: 13 U/L (ref 9–46)
AST: 21 U/L (ref 10–35)
Albumin: 3.9 g/dL (ref 3.6–5.1)
Alkaline phosphatase (APISO): 65 U/L (ref 40–115)
Bilirubin, Direct: 0.4 mg/dL — ABNORMAL HIGH (ref 0.0–0.2)
Globulin: 2.7 g/dL (ref 1.9–3.7)
Indirect Bilirubin: 1.8 mg/dL — ABNORMAL HIGH (ref 0.2–1.2)
Total Bilirubin: 2.2 mg/dL — ABNORMAL HIGH (ref 0.2–1.2)
Total Protein: 6.6 g/dL (ref 6.1–8.1)

## 2017-02-12 LAB — INSULIN, RANDOM: INSULIN: 2.6 u[IU]/mL (ref 2.0–19.6)

## 2017-02-12 LAB — VITAMIN D 25 HYDROXY (VIT D DEFICIENCY, FRACTURES): Vit D, 25-Hydroxy: 69 ng/mL (ref 30–100)

## 2017-02-12 LAB — MICROALBUMIN / CREATININE URINE RATIO
Creatinine, Urine: 136 mg/dL (ref 20–320)
MICROALB UR: 6.1 mg/dL
MICROALB/CREAT RATIO: 45 ug/mg{creat} — AB (ref <30)

## 2017-02-12 LAB — TSH: TSH: 1.82 m[IU]/L (ref 0.40–4.50)

## 2017-02-12 LAB — MAGNESIUM: MAGNESIUM: 2 mg/dL (ref 1.5–2.5)

## 2017-02-15 ENCOUNTER — Encounter: Payer: Self-pay | Admitting: Internal Medicine

## 2017-03-08 ENCOUNTER — Other Ambulatory Visit: Payer: Self-pay | Admitting: Internal Medicine

## 2017-03-08 ENCOUNTER — Other Ambulatory Visit: Payer: Self-pay | Admitting: *Deleted

## 2017-03-08 DIAGNOSIS — I10 Essential (primary) hypertension: Secondary | ICD-10-CM

## 2017-03-08 MED ORDER — LOSARTAN POTASSIUM-HCTZ 100-25 MG PO TABS: 1.0000 | ORAL_TABLET | Freq: Every day | ORAL | 1 refills | Status: DC

## 2017-03-08 MED ORDER — FLECAINIDE ACETATE 50 MG PO TABS
ORAL_TABLET | ORAL | 3 refills | Status: DC
Start: 2017-03-08 — End: 2017-04-27

## 2017-03-08 MED ORDER — ATENOLOL 25 MG PO TABS: ORAL_TABLET | ORAL | 1 refills | Status: DC

## 2017-03-11 ENCOUNTER — Other Ambulatory Visit: Payer: Self-pay | Admitting: *Deleted

## 2017-03-11 MED ORDER — ATENOLOL 50 MG PO TABS: 50.0000 mg | ORAL_TABLET | Freq: Every day | ORAL | 1 refills | Status: DC

## 2017-03-16 DIAGNOSIS — I480 Paroxysmal atrial fibrillation: Secondary | ICD-10-CM | POA: Diagnosis not present

## 2017-03-16 DIAGNOSIS — Z7901 Long term (current) use of anticoagulants: Secondary | ICD-10-CM | POA: Diagnosis not present

## 2017-03-16 DIAGNOSIS — Z9889 Other specified postprocedural states: Secondary | ICD-10-CM | POA: Diagnosis not present

## 2017-03-16 DIAGNOSIS — I4892 Unspecified atrial flutter: Secondary | ICD-10-CM | POA: Diagnosis not present

## 2017-03-22 DIAGNOSIS — E349 Endocrine disorder, unspecified: Secondary | ICD-10-CM | POA: Insufficient documentation

## 2017-03-22 DIAGNOSIS — E559 Vitamin D deficiency, unspecified: Secondary | ICD-10-CM | POA: Insufficient documentation

## 2017-03-22 HISTORY — DX: Endocrine disorder, unspecified: E34.9

## 2017-03-23 DIAGNOSIS — I4892 Unspecified atrial flutter: Secondary | ICD-10-CM | POA: Diagnosis not present

## 2017-03-23 DIAGNOSIS — I480 Paroxysmal atrial fibrillation: Secondary | ICD-10-CM | POA: Diagnosis not present

## 2017-03-23 DIAGNOSIS — I4891 Unspecified atrial fibrillation: Secondary | ICD-10-CM | POA: Diagnosis not present

## 2017-03-23 DIAGNOSIS — Z7901 Long term (current) use of anticoagulants: Secondary | ICD-10-CM | POA: Diagnosis not present

## 2017-04-27 ENCOUNTER — Other Ambulatory Visit: Payer: Self-pay | Admitting: *Deleted

## 2017-04-27 MED ORDER — FLECAINIDE ACETATE 50 MG PO TABS: ORAL_TABLET | ORAL | 3 refills | Status: DC

## 2017-05-07 DIAGNOSIS — E782 Mixed hyperlipidemia: Secondary | ICD-10-CM | POA: Diagnosis not present

## 2017-05-07 DIAGNOSIS — Z7982 Long term (current) use of aspirin: Secondary | ICD-10-CM | POA: Diagnosis not present

## 2017-05-07 DIAGNOSIS — I4891 Unspecified atrial fibrillation: Secondary | ICD-10-CM | POA: Diagnosis not present

## 2017-05-07 DIAGNOSIS — I480 Paroxysmal atrial fibrillation: Secondary | ICD-10-CM | POA: Diagnosis not present

## 2017-05-07 DIAGNOSIS — I4892 Unspecified atrial flutter: Secondary | ICD-10-CM | POA: Diagnosis not present

## 2017-05-07 DIAGNOSIS — I10 Essential (primary) hypertension: Secondary | ICD-10-CM | POA: Diagnosis not present

## 2017-05-11 DIAGNOSIS — Z7982 Long term (current) use of aspirin: Secondary | ICD-10-CM | POA: Diagnosis not present

## 2017-05-11 DIAGNOSIS — I480 Paroxysmal atrial fibrillation: Secondary | ICD-10-CM | POA: Diagnosis not present

## 2017-05-11 DIAGNOSIS — I4891 Unspecified atrial fibrillation: Secondary | ICD-10-CM | POA: Diagnosis not present

## 2017-05-11 DIAGNOSIS — I10 Essential (primary) hypertension: Secondary | ICD-10-CM | POA: Diagnosis not present

## 2017-05-11 DIAGNOSIS — I4892 Unspecified atrial flutter: Secondary | ICD-10-CM | POA: Diagnosis not present

## 2017-05-11 DIAGNOSIS — E782 Mixed hyperlipidemia: Secondary | ICD-10-CM | POA: Diagnosis not present

## 2017-05-27 ENCOUNTER — Other Ambulatory Visit: Payer: Self-pay | Admitting: Internal Medicine

## 2017-05-27 ENCOUNTER — Ambulatory Visit: Payer: Self-pay | Admitting: Adult Health

## 2017-05-27 ENCOUNTER — Encounter: Payer: Self-pay | Admitting: Internal Medicine

## 2017-05-27 DIAGNOSIS — E782 Mixed hyperlipidemia: Secondary | ICD-10-CM

## 2017-05-27 MED ORDER — SIMVASTATIN 80 MG PO TABS: 80.0000 mg | ORAL_TABLET | Freq: Every day | ORAL | 1 refills | Status: DC

## 2017-05-28 ENCOUNTER — Other Ambulatory Visit: Payer: Self-pay | Admitting: Internal Medicine

## 2017-05-28 MED ORDER — ALPRAZOLAM 1 MG PO TABS: ORAL_TABLET | ORAL | 0 refills | Status: DC

## 2017-07-08 DIAGNOSIS — C61 Malignant neoplasm of prostate: Secondary | ICD-10-CM | POA: Diagnosis not present

## 2017-07-14 DIAGNOSIS — N5201 Erectile dysfunction due to arterial insufficiency: Secondary | ICD-10-CM | POA: Diagnosis not present

## 2017-07-14 DIAGNOSIS — C61 Malignant neoplasm of prostate: Secondary | ICD-10-CM | POA: Diagnosis not present

## 2017-08-25 ENCOUNTER — Other Ambulatory Visit: Payer: Self-pay | Admitting: Internal Medicine

## 2017-08-31 ENCOUNTER — Ambulatory Visit: Payer: Self-pay | Admitting: Internal Medicine

## 2017-09-29 ENCOUNTER — Ambulatory Visit: Payer: Self-pay | Admitting: Internal Medicine

## 2017-10-05 ENCOUNTER — Ambulatory Visit (INDEPENDENT_AMBULATORY_CARE_PROVIDER_SITE_OTHER): Payer: Medicare Other | Admitting: Internal Medicine

## 2017-10-05 VITALS — BP 88/56 | HR 88 | Temp 97.0°F | Resp 16 | Ht 78.5 in | Wt 229.6 lb

## 2017-10-05 DIAGNOSIS — E559 Vitamin D deficiency, unspecified: Secondary | ICD-10-CM

## 2017-10-05 DIAGNOSIS — Z79899 Other long term (current) drug therapy: Secondary | ICD-10-CM | POA: Diagnosis not present

## 2017-10-05 DIAGNOSIS — I480 Paroxysmal atrial fibrillation: Secondary | ICD-10-CM

## 2017-10-05 DIAGNOSIS — E782 Mixed hyperlipidemia: Secondary | ICD-10-CM

## 2017-10-05 DIAGNOSIS — R7309 Other abnormal glucose: Secondary | ICD-10-CM

## 2017-10-05 DIAGNOSIS — I10 Essential (primary) hypertension: Secondary | ICD-10-CM

## 2017-10-05 MED ORDER — LOSARTAN POTASSIUM 100 MG PO TABS
ORAL_TABLET | ORAL | 0 refills | Status: DC
Start: 2017-10-05 — End: 2017-11-24

## 2017-10-05 NOTE — Progress Notes (Signed)
This very nice 72 y.o. MWM presents for 6 month follow up with HTN, HLD, Pre-Diabetes and Vitamin D Deficiency.  Patient [place] He relates BP was 75/48 earlier today at home.      Patient is treated for HTN  (2006) & BP has been controlled at home. Today's BP: 88/56-sit  And 80/56-stand. Patient has had no complaints of any cardiac type chest pain, palpitations, dyspnea / orthopnea / PND, claudication or dependent edema. In 2007, patient had a negative Cardiolite . In Jan 2018, he presented w/new Afib/AFlut & had a normal heart cath in Jan 2018. In Mar 2018, he failed CV and is followed by Dr Wyline Copas in [facility] on Eliquis and Flecainide.      Hyperlipidemia is controlled with diet & meds. Patient denies myalgias or other med SE's. Last Lipids were at goal: Lab Results  Component Value Date   CHOL 161 02/11/2017   HDL 79 02/11/2017   LDLCALC 69 02/11/2017   TRIG 46 02/11/2017   CHOLHDL 2.0 02/11/2017      Also, the patient has history of abnormal glucose and is expectantly screened for prediabetes and has had no symptoms of reactive hypoglycemia, diabetic polys, paresthesias or visual blurring.  Last A1c was Normal & at goal: Lab Results  Component Value Date   HGBA1C 5.1 02/11/2017      Further, the patient also has history of Vitamin D Deficiency ("24"/2008) and supplements vitamin D without any suspected side-effects. Last vitamin D was at goal:  Lab Results  Component Value Date   VD25OH 69 02/11/2017   Current Outpatient Medications on File Prior to Visit  Medication Sig  . ALPRAZolam (XANAX) 1 MG tablet Take 1/2 to 1 tablet ONLY as needed for sleep  . atenolol (TENORMIN) 50 MG tablet TAKE 1 TABLET DAILY **     DISCONTINUE ATENOLOL 25MG  **  . Cholecalciferol (VITAMIN D3) 5000 UNITS CAPS Take 5,000 Int'l Units by mouth daily.  . diphenhydrAMINE (BENADRYL) 25 mg capsule Take 25 mg by mouth at bedtime as needed for sleep.    . flecainide (TAMBOCOR) 100 MG tablet Take 100 mg by mouth 2 (two) times daily.  . Glucosamine-Chondroit-Vit C-Mn (GLUCOSAMINE 1500 COMPLEX PO) Take by mouth daily.  Marland Kitchen losartan-hydrochlorothiazide (HYZAAR) 100-25 MG tablet TAKE 1 TABLET DAILY  . simvastatin (ZOCOR) 80 MG tablet Take 1 tablet (80 mg total) by mouth daily.  . valACYclovir (VALTREX) 500 MG tablet TAKE 1 TABLET EVERY DAY  FOR  FEVER  BLISTERS AS DIRECTED   No current facility-administered medications on file prior to visit.    Allergies  Allergen Reactions  . Ultram [Tramadol] Anaphylaxis   PMHx:   Past Medical History:  Diagnosis Date  . Anxiety   . Hyperlipidemia   . Hypertension   . Other testicular hypofunction   . Vitamin D deficiency    Immunization History  Administered Date(s) Administered  . Influenza, High Dose Seasonal PF 01/31/2015  . Influenza-Unspecified 12/24/2016  . MMR 02/23/2005  . Pneumococcal Conjugate-13 01/31/2015  . Pneumococcal Polysaccharide-23 02/23/2010, 03/30/2016  . Tdap 02/23/2005   Past Surgical History:  Procedure Laterality Date  . COLONOSCOPY    . LYMPHADENECTOMY Bilateral 06/23/2012   Procedure: LYMPHADENECTOMY;  Surgeon: Dutch Gray, MD;  Location: [facility];  Service: Urology;  Laterality: Bilateral;  . ROBOT ASSISTED LAPAROSCOPIC RADICAL PROSTATECTOMY N/A 06/23/2012   Procedure: ROBOTIC ASSISTED LAPAROSCOPIC RADICAL PROSTATECTOMY LEVEL 2;  Surgeon:  Dutch Gray, MD;  Location: [facility];  Service: Urology;  Laterality: N/A;   FHx:    Reviewed / unchanged  SHx:    Reviewed / unchanged   Systems Review:  Constitutional: Denies fever, chills, wt changes, headaches, insomnia, fatigue, night sweats, change in appetite. Eyes: Denies redness, blurred vision, diplopia, discharge, itchy, watery eyes.  ENT: Denies discharge, congestion, post nasal drip, epistaxis, sore throat, earache, hearing loss, dental pain, tinnitus, vertigo, sinus pain, snoring.  CV: Denies chest pain, palpitations,  irregular heartbeat, syncope, dyspnea, diaphoresis, orthopnea, PND, claudication or edema. Respiratory: denies cough, dyspnea, DOE, pleurisy, hoarseness, laryngitis, wheezing.  Gastrointestinal: Denies dysphagia, odynophagia, heartburn, reflux, water brash, abdominal pain or cramps, nausea, vomiting, bloating, diarrhea, constipation, hematemesis, melena, hematochezia  or hemorrhoids. Genitourinary: Denies dysuria, frequency, urgency, nocturia, hesitancy, discharge, hematuria or flank pain. Musculoskeletal: Denies arthralgias, myalgias, stiffness, jt. swelling, pain, limping or strain/sprain.  Skin: Denies pruritus, rash, hives, warts, acne, eczema or change in skin lesion(s). Neuro: No weakness, tremor, incoordination, spasms, paresthesia or pain. Psychiatric: Denies confusion, memory loss or sensory loss. Endo: Denies change in weight, skin or hair change.  Heme/Lymph: No excessive bleeding, bruising or enlarged lymph nodes.  Physical Exam  BP (!) 88/56 Comment: 88/56-sit /80/56-standing  Pulse 88   Temp (!) 97 F (36.1 C)   Resp 16   Ht 6' 6.5" (1.994 m)   Wt 229 lb 9.6 oz (104.1 kg)   BMI 26.20 kg/m   Recheck Postural Sitting     BP 121/67     P 67           &             Standing   BP 109/71      P  86  Appears  well nourished, well groomed  and in no distress.  Eyes: PERRLA, EOMs, conjunctiva no swelling or erythema. Sinuses: No frontal/maxillary tenderness ENT/Mouth: EAC's clear, TM's nl w/o erythema, bulging. Nares clear w/o erythema, swelling, exudates. Oropharynx clear without erythema or exudates. Oral hygiene is good. Tongue normal, non obstructing. Hearing intact.  Neck: Supple. Thyroid not palpable. Car 2+/2+ without bruits, nodes or JVD. Chest: Respirations nl with BS clear & equal w/o rales, rhonchi, wheezing or stridor.  Cor: Heart sounds normal w/ regular rate and rhythm without sig. murmurs, gallops, clicks or rubs. Peripheral pulses normal and equal  without edema.    Abdomen: Soft & bowel sounds normal. Non-tender w/o guarding, rebound, hernias, masses or organomegaly.  Lymphatics: Unremarkable.  Musculoskeletal: Full ROM all peripheral extremities, joint stability, 5/5 strength and normal gait.  Skin: Warm, dry without exposed rashes, lesions or ecchymosis apparent.  Neuro: Cranial nerves intact, reflexes equal bilaterally. Sensory-motor testing grossly intact. Tendon reflexes grossly intact.  Pysch: Alert & oriented x 3.  Insight and judgement nl & appropriate. No ideations.  Assessment and Plan:  1. Essential hypertension  - Recommend d/c Hyzaar and replace with Losartan, monitor blood pressure at home.  - Continue DASH diet.  Reminder to go to the ER if any CP,  SOB, nausea, dizziness, severe HA, changes vision/speech.  - Reminded patient importance of adequate water /fluid intake, especially when playing golf or working outside in hot weather.   - CBC with Differential/Platelet - COMPLETE METABOLIC PANEL WITH GFR - Magnesium - TSH - losartan (COZAAR) 100 MG tablet; Take 1/2 to 1 tablet daily for BP  Dispense: 90 tablet; Refill: 0  2. Hyperlipidemia, mixed  - Continue diet/meds, exercise,& lifestyle modifications.  -  Continue monitor periodic cholesterol/liver & renal functions   - Lipid panel - TSH  3. Abnormal glucose  - Continue diet, exercise, lifestyle modifications.  - Monitor appropriate labs.  - Hemoglobin A1c - Insulin, random  4. Vitamin D deficiency  - Continue supplementation.  - VITAMIN D 25 Hydroxyl  5. Paroxysmal atrial fibrillation (HCC)  - TSH  6. Medication management  - CBC with Differential/Platelet - COMPLETE METABOLIC PANEL WITH GFR - Magnesium - Lipid panel - TSH - Hemoglobin A1c - Insulin, random - VITAMIN D 25 Hydroxyl       Discussed  regular exercise, BP monitoring, weight control to achieve/maintain BMI less than 25 and discussed med and SE's. Recommended labs to assess and monitor  clinical status with further disposition pending results of labs. Over 30 minutes of exam, counseling, chart review was performed.

## 2017-10-05 NOTE — Patient Instructions (Addendum)
Atrial Fibrillation Atrial fibrillation is a type of irregular or rapid heartbeat (arrhythmia). In atrial fibrillation, the heart quivers continuously in a chaotic pattern. This occurs when parts of the heart receive disorganized signals that make the heart unable to pump blood normally. This can increase the risk for stroke, heart failure, and other heart-related conditions. There are different types of atrial fibrillation, including:  Paroxysmal atrial fibrillation. This type starts suddenly, and it usually stops on its own shortly after it starts.  Persistent atrial fibrillation. This type often lasts longer than a week. It may stop on its own or with treatment.  Long-lasting persistent atrial fibrillation. This type lasts longer than 12 months.  Permanent atrial fibrillation. This type does not go away.  Talk with your health care provider to learn about the type of atrial fibrillation that you have. What are the causes? This condition is caused by some heart-related conditions or procedures, including:  A heart attack.  Coronary artery disease.  Heart failure.  Heart valve conditions.  High blood pressure.  Inflammation of the sac that surrounds the heart (pericarditis).  Heart surgery.  Certain heart rhythm disorders, such as Wolf-Parkinson-White syndrome.  Other causes include:  Pneumonia.  Obstructive sleep apnea.  Blockage of an artery in the lungs (pulmonary embolism, or PE).  Lung cancer.  Chronic lung disease.  Thyroid problems, especially if the thyroid is overactive (hyperthyroidism).  Caffeine.  Excessive alcohol use or illegal drug use.  Use of some medicines, including certain decongestants and diet pills.  Sometimes, the cause cannot be found. What increases the risk? This condition is more likely to develop in:  People who are older in age.  People who smoke.  People who have diabetes mellitus.  People who are overweight  (obese).  Athletes who exercise vigorously.  What are the signs or symptoms? Symptoms of this condition include:  A feeling that your heart is beating rapidly or irregularly.  A feeling of discomfort or pain in your chest.  Shortness of breath.  Sudden light-headedness or weakness.  Getting tired easily during exercise.  In some cases, there are no symptoms. How is this diagnosed? Your health care provider may be able to detect atrial fibrillation when taking your pulse. If detected, this condition may be diagnosed with:  An electrocardiogram (ECG).  A Holter monitor test that records your heartbeat patterns over a 24-hour period.  Transthoracic echocardiogram (TTE) to evaluate how blood flows through your heart.  Transesophageal echocardiogram (TEE) to view more detailed images of your heart.  A stress test.  Imaging tests, such as a CT scan or chest X-ray.  Blood tests.  How is this treated? The main goals of treatment are to prevent blood clots from forming and to keep your heart beating at a normal rate and rhythm. The type of treatment that you receive depends on many factors, such as your underlying medical conditions and how you feel when you are experiencing atrial fibrillation. This condition may be treated with:  Medicine to slow down the heart rate, bring the heart's rhythm back to normal, or prevent clots from forming.  Electrical cardioversion. This is a procedure that resets your heart's rhythm by delivering a controlled, low-energy shock to the heart through your skin.  Different types of ablation, such as catheter ablation, catheter ablation with pacemaker, or surgical ablation. These procedures destroy the heart tissues that send abnormal signals. When the pacemaker is used, it is placed under your skin to help your heart beat in   a regular rhythm.  Follow these instructions at home:  Take over-the counter and prescription medicines only as told by your  health care provider.  If your health care provider prescribed a blood-thinning medicine (anticoagulant), take it exactly as told. Taking too much blood-thinning medicine can cause bleeding. If you do not take enough blood-thinning medicine, you will not have the protection that you need against stroke and other problems.  Do not use tobacco products, including cigarettes, chewing tobacco, and e-cigarettes. If you need help quitting, ask your health care provider.  If you have obstructive sleep apnea, manage your condition as told by your health care provider.  Do not drink alcohol.  Do not drink beverages that contain caffeine, such as coffee, soda, and tea.  Maintain a healthy weight. Do not use diet pills unless your health care provider approves. Diet pills may make heart problems worse.  Follow diet instructions as told by your health care provider.  Exercise regularly as told by your health care provider.  Keep all follow-up visits as told by your health care provider. This is important. How is this prevented?  Avoid drinking beverages that contain caffeine or alcohol.  Avoid certain medicines, especially medicines that are used for breathing problems.  Avoid certain herbs and herbal medicines, such as those that contain ephedra or ginseng.  Do not use illegal drugs, such as cocaine and amphetamines.  Do not smoke.  Manage your high blood pressure. Contact a health care provider if:  You notice a change in the rate, rhythm, or strength of your heartbeat.  You are taking an anticoagulant and you notice increased bruising.  You tire more easily when you exercise or exert yourself. Get help right away if:  You have chest pain, abdominal pain, sweating, or weakness.  You feel nauseous.  You notice blood in your vomit, bowel movement, or urine.  You have shortness of breath.  You suddenly have swollen feet and ankles.  You feel dizzy.  You have sudden weakness or  numbness of the face, arm, or leg, especially on one side of the body.  You have trouble speaking, trouble understanding, or both (aphasia).  Your face or your eyelid droops on one side. These symptoms may represent a serious problem that is an emergency. Do not wait to see if the symptoms will go away. Get medical help right away. Call your local emergency services (911 in the [place] Do not drive yourself to the hospital. This information is not intended to replace advice given to you by your health care provider. Make sure you discuss any questions you have with your health care provider. Document Released: 02/09/2005 Document Revised: 06/19/2015 Document Reviewed: 06/06/2014 Elsevier Interactive Patient Education  2018 [employer].  ++++++++++++++++++++++++++++++++++ Bleeding Precautions When on Anticoagulant Therapy WHAT IS ANTICOAGULANT THERAPY? Anticoagulant therapy is taking medicine to prevent or reduce blood clots. It is also called blood thinner therapy. Blood clots that form in your blood vessels can be dangerous. They can break loose and travel to your heart, lungs, or brain. This increases your risk of a heart attack or stroke. Anticoagulant therapy causes blood to clot more slowly. You may need anticoagulant therapy if you have:  A medical condition that increases the likelihood that blood clots will form.  A heart defect or a problem with heart rhythm. It is also a common treatment after heart surgery, such as valve replacement. WHAT ARE COMMON TYPES OF ANTICOAGULANT THERAPY? Anticoagulant medicine can be injected or taken by mouth.If  you need anticoagulant therapy quickly at the hospital, the medicine may be injected under your skin or given through an IV tube. Heparin is a common example of an anticoagulant that you may get at the hospital. Most anticoagulant therapy is in the form of pills that you take at home every day. These may include:  Aspirin. This common blood  thinner works by preventing blood cells (platelets) from sticking together to form a clot. Aspirin is not as strong as anticoagulants that slow down the time that it takes for your body to form a clot.  Clopidogrel. This is a newer type of drug that affects platelets. It is stronger than aspirin.  Warfarin. This is the most common anticoagulant. It changes the way your body uses vitamin K, a vitamin that helps your blood to clot. The risk of bleeding is higher with warfarin than with aspirin. You will need frequent blood tests to make sure you are taking the safest amount.  New anticoagulants. Several new drugs have been approved. They are all taken by mouth. Studies show that these drugs work as well as warfarin. They do not require blood testing. They may cause less bleeding risk than warfarin. WHAT DO I NEED TO REMEMBER WHEN TAKING ANTICOAGULANT THERAPY? Anticoagulant therapy decreases your risk of forming a blood clot, but it increases your risk of bleeding. Work closely with your health care provider to make sure you are taking your medicine safely. These tips can help:  Learn ways to reduce your risk of bleeding.  If you are taking warfarin: ? Have blood tests as ordered by your health care provider. ? Do not make any sudden changes to your diet. Vitamin K in your diet can make warfarin less effective. ? Do not get pregnant. This medicine may cause birth defects.  Take your medicine at the same time every day. If you forget to take your medicine, take it as soon as you remember. If you miss a whole day, do not double your dose of medicine. Take your normal dose and call your health care provider to check in.  Do not stop taking your medicine on your own.  Tell your health care provider before you start taking any new medicine, vitamin, or herbal product. Some of these could interfere with your therapy.  Tell all of your health care providers that you are on anticoagulant therapy.  Do  not have surgery, medical procedures, or dental work until you tell your health care provider that you are on anticoagulant therapy. WHAT CAN AFFECT HOW ANTICOAGULANTS WORK? Certain foods, vitamins, medicines, supplements, and herbal medicines change the way that anticoagulant therapy works. They may increase or decrease the effects of your anticoagulant therapy. Either result can be dangerous for you.  Many over-the-counter medicines for pain, colds, or stomach problems interfere with anticoagulant therapy. Take these only as told by your health care provider.  Do not drink alcohol. It can interfere with your medicine and increase your risk of an injury that causes bleeding.  If you are taking warfarin, do not begin eating more foods that contain vitamin K. These include leafy green vegetables. Ask your health care provider if you should avoid any foods. WHAT ARE SOME WAYS TO PREVENT BLEEDING? You can prevent bleeding by taking certain precautions:  Be extra careful when you use knives, scissors, or other sharp objects.  Use an electric razor instead of a blade.  Do not use toothpicks.  Use a soft toothbrush.  Wear shoes that have  nonskid soles.  Use bath mats and handrails in your bathroom.  Wear gloves while you do yard work.  Wear a helmet when you ride a bike.  Wear your seat belt.  Prevent falls by removing loose rugs and extension cords from areas where you walk.  Do not play contact sports or participate in other activities that have a high risk of injury. [place] PROVIDER? Call your health care provider if:  You miss a dose of medicine: ? And you are not sure what to do. ? For more than one day.  You have: ? Menstrual bleeding that is heavier than normal. ? Blood in your urine. ? A bloody nose or bleeding gums. ? Easy bruising. ? Blood in your stool (feces) or have black and tarry stool. ? Side effects from your medicine.  You feel  weak or dizzy.  You become pregnant. Seek immediate medical care if:  You have bleeding that will not stop.  You have sudden and severe headache or belly pain.  You vomit or you cough up bright red blood.  You have a severe blow to your head. WHAT ARE SOME QUESTIONS TO ASK MY HEALTH CARE PROVIDER?  What is the best anticoagulant therapy for my condition?  What side effects should I watch for?  When should I take my medicine? What should I do if I forget to take it?  Will I need to have regular blood tests?  Do I need to change my diet? Are there foods or drinks that I should avoid?  What activities are safe for me?  What should I do if I want to get pregnant? This information is not intended to replace advice given to you by your health care provider. Make sure you discuss any questions you have with your health care provider. Document Released: 01/21/2015 Document Reviewed: 01/21/2015 Elsevier Interactive Patient Education  2017 [place] Adult Low Dose Aspirin or  coated  Aspirin 81 mg daily  To reduce risk of Colon Cancer 20 %,  Skin Cancer 26 % ,  Melanoma 46%  and  Pancreatic cancer 60% +++++++++++++++++++++++++ Vitamin D goal  is between 70-100.  Please make sure that you are taking your Vitamin D as directed.  It is very important as a natural anti-inflammatory  helping hair, skin, and nails, as well as reducing stroke and heart attack risk.  It helps your bones and helps with mood. It also decreases numerous cancer risks so please take it as directed.  Low Vit D is associated with a 200-300% higher risk for CANCER  and 200-300% higher risk for HEART   ATTACK  &  STROKE.   .....................................[place] Kitchen It is also associated with higher death rate at younger ages,  autoimmune diseases like Rheumatoid arthritis, Lupus, Multiple Sclerosis.    Also many other serious conditions, like depression, Alzheimer's Dementia, infertility, muscle  aches, fatigue, fibromyalgia - just to name a few. ++++++++++++++++++++ Recommend the book "The END of DIETING" by Dr Excell Seltzer  & the book "The END of DIABETES " by Dr Excell Seltzer At [email or web address] - get book & Audio CD's    Being diabetic has a  300% increased risk for heart attack, stroke, cancer, and alzheimer- type vascular dementia. It is very important that you work harder with diet by avoiding all foods that are white. Avoid white rice (brown & wild rice is OK), white potatoes (sweetpotatoes in moderation is OK), White bread or wheat  bread or anything made out of white flour like bagels, donuts, rolls, buns, biscuits, cakes, pastries, cookies, pizza crust, and pasta (made from white flour & egg whites) - vegetarian pasta or spinach or wheat pasta is OK. Multigrain breads like Arnold's or Pepperidge Farm, or multigrain sandwich thins or flatbreads.  Diet, exercise and weight loss can reverse and cure diabetes in the early stages.  Diet, exercise and weight loss is very important in the control and prevention of complications of diabetes which affects every system in your body, ie. Brain - dementia/stroke, eyes - glaucoma/blindness, heart - heart attack/heart failure, kidneys - dialysis, stomach - gastric paralysis, intestines - malabsorption, nerves - severe painful neuritis, circulation - gangrene & loss of a leg(s), and finally cancer and Alzheimers.    I recommend avoid fried & greasy foods,  sweets/candy, white rice (brown or wild rice or Quinoa is OK), white potatoes (sweet potatoes are OK) - anything made from white flour - bagels, doughnuts, rolls, buns, biscuits,white and wheat breads, pizza crust and traditional pasta made of white flour & egg white(vegetarian pasta or spinach or wheat pasta is OK).  Multi-grain bread is OK - like multi-grain flat bread or sandwich thins. Avoid alcohol in excess. Exercise is also important.    Eat all the vegetables you want - avoid meat, especially red meat  and dairy - especially cheese.  Cheese is the most concentrated form of trans-fats which is the worst thing to clog up our arteries. Veggie cheese is OK which can be found in the fresh produce section at [employer] or [employer] or [employer]  +++++++++++++++++++++ DASH Eating Plan  DASH stands for "Dietary Approaches to Stop Hypertension."   The DASH eating plan is a healthy eating plan that has been shown to reduce high blood pressure (hypertension). Additional health benefits may include reducing the risk of type 2 diabetes mellitus, heart disease, and stroke. The DASH eating plan may also help with weight loss. WHAT DO I NEED TO KNOW ABOUT THE DASH EATING PLAN? For the DASH eating plan, you will follow these general guidelines:  Choose foods with a percent daily value for sodium of less than 5% (as listed on the food label).  Use salt-free seasonings or herbs instead of table salt or sea salt.  Check with your health care provider or pharmacist before using salt substitutes.  Eat lower-sodium products, often labeled as "lower sodium" or "no salt added."  Eat fresh foods.  Eat more vegetables, fruits, and low-fat dairy products.  Choose whole grains. Look for the word "whole" as the first word in the ingredient list.  Choose fish   Limit sweets, desserts, sugars, and sugary drinks.  Choose heart-healthy fats.  Eat veggie cheese   Eat more home-cooked food and less restaurant, buffet, and fast food.  Limit fried foods.  Cook foods using methods other than frying.  Limit canned vegetables. If you do use them, rinse them well to decrease the sodium.  When eating at a restaurant, ask that your food be prepared with less salt, or no salt if possible.                      WHAT FOODS CAN I EAT? Read Dr Fara Olden Fuhrman's books on The End of Dieting & The End of Diabetes  Grains Whole grain or whole wheat bread. Brown rice. Whole grain or whole wheat pasta. Quinoa, bulgur,  and whole grain cereals. Low-sodium cereals. Corn or whole wheat flour  tortillas. Whole grain cornbread. Whole grain crackers. Low-sodium crackers.  Vegetables Fresh or frozen vegetables (raw, steamed, roasted, or grilled). Low-sodium or reduced-sodium tomato and vegetable juices. Low-sodium or reduced-sodium tomato sauce and paste. Low-sodium or reduced-sodium canned vegetables.   Fruits All fresh, canned (in natural juice), or frozen fruits.  Protein Products  All fish and seafood.  Dried beans, peas, or lentils. Unsalted nuts and seeds. Unsalted canned beans.  Dairy Low-fat dairy products, such as skim or 1% milk, 2% or reduced-fat cheeses, low-fat ricotta or cottage cheese, or plain low-fat yogurt. Low-sodium or reduced-sodium cheeses.  Fats and Oils Tub margarines without trans fats. Light or reduced-fat mayonnaise and salad dressings (reduced sodium). Avocado. Safflower, olive, or canola oils. Natural peanut or almond butter.  Other Unsalted popcorn and pretzels. The items listed above may not be a complete list of recommended foods or beverages. Contact your dietitian for more options.  +++++++++++++++  WHAT FOODS ARE NOT RECOMMENDED? Grains/ White flour or wheat flour White bread. White pasta. White rice. Refined cornbread. Bagels and croissants. Crackers that contain trans fat.  Vegetables  Creamed or fried vegetables. Vegetables in a . Regular canned vegetables. Regular canned tomato sauce and paste. Regular tomato and vegetable juices.  Fruits Dried fruits. Canned fruit in light or heavy syrup. Fruit juice.  Meat and Other Protein Products Meat in general - RED meat & White meat.  Fatty cuts of meat. Ribs, chicken wings, all processed meats as bacon, sausage, bologna, salami, fatback, hot dogs, bratwurst and packaged luncheon meats.  Dairy Whole or 2% milk, cream, half-and-half, and cream cheese. Whole-fat or sweetened yogurt. Full-fat cheeses or blue cheese.  Non-dairy creamers and whipped toppings. Processed cheese, cheese spreads, or cheese curds.  Condiments Onion and garlic salt, seasoned salt, table salt, and sea salt. Canned and packaged gravies. Worcestershire sauce. Tartar sauce. Barbecue sauce. Teriyaki sauce. Soy sauce, including reduced sodium. Steak sauce. Fish sauce. Oyster sauce. Cocktail sauce. Horseradish. Ketchup and mustard. Meat flavorings and tenderizers. Bouillon cubes. Hot sauce. Tabasco sauce. Marinades. Taco seasonings. Relishes.  Fats and Oils Butter, stick margarine, lard, shortening and bacon fat. Coconut, palm kernel, or palm oils. Regular salad dressings.  Pickles and olives. Salted popcorn and pretzels.  The items listed above may not be a complete list of foods and beverages to avoid.

## 2017-10-06 LAB — CBC WITH DIFFERENTIAL/PLATELET
BASOS PCT: 0.8 %
Basophils Absolute: 62 cells/uL (ref 0–200)
EOS ABS: 94 {cells}/uL (ref 15–500)
Eosinophils Relative: 1.2 %
HCT: 42.7 % (ref 38.5–50.0)
HEMOGLOBIN: 14.9 g/dL (ref 13.2–17.1)
LYMPHS ABS: 1310 {cells}/uL (ref 850–3900)
MCH: 33.9 pg — AB (ref 27.0–33.0)
MCHC: 34.9 g/dL (ref 32.0–36.0)
MCV: 97.3 fL (ref 80.0–100.0)
MONOS PCT: 8.9 %
MPV: 9.5 fL (ref 7.5–12.5)
NEUTROS ABS: 5639 {cells}/uL (ref 1500–7800)
Neutrophils Relative %: 72.3 %
Platelets: 242 10*3/uL (ref 140–400)
RBC: 4.39 10*6/uL (ref 4.20–5.80)
RDW: 12.4 % (ref 11.0–15.0)
Total Lymphocyte: 16.8 %
WBC: 7.8 10*3/uL (ref 3.8–10.8)
WBCMIX: 694 {cells}/uL (ref 200–950)

## 2017-10-06 LAB — LIPID PANEL
CHOLESTEROL: 193 mg/dL (ref <200)
HDL: 78 mg/dL (ref >40)
LDL Cholesterol (Calc): 93 mg/dL (calc)
Non-HDL Cholesterol (Calc): 115 mg/dL (calc) (ref <130)
Total CHOL/HDL Ratio: 2.5 (calc) (ref <5.0)
Triglycerides: 121 mg/dL (ref <150)

## 2017-10-06 LAB — COMPLETE METABOLIC PANEL WITH GFR
AG Ratio: 1.8 (calc) (ref 1.0–2.5)
ALKALINE PHOSPHATASE (APISO): 72 U/L (ref 40–115)
ALT: 16 U/L (ref 9–46)
AST: 22 U/L (ref 10–35)
Albumin: 4.8 g/dL (ref 3.6–5.1)
BILIRUBIN TOTAL: 2 mg/dL — AB (ref 0.2–1.2)
BUN/Creatinine Ratio: 16 (calc) (ref 6–22)
BUN: 40 mg/dL — ABNORMAL HIGH (ref 7–25)
CHLORIDE: 102 mmol/L (ref 98–110)
CO2: 26 mmol/L (ref 20–32)
CREATININE: 2.48 mg/dL — AB (ref 0.70–1.18)
Calcium: 9.9 mg/dL (ref 8.6–10.3)
GFR, Est African American: 29 mL/min/{1.73_m2} — ABNORMAL LOW (ref >=60)
GFR, Est Non African American: 25 mL/min/{1.73_m2} — ABNORMAL LOW (ref >=60)
GLOBULIN: 2.6 g/dL (ref 1.9–3.7)
Glucose, Bld: 90 mg/dL (ref 65–99)
POTASSIUM: 4.6 mmol/L (ref 3.5–5.3)
SODIUM: 139 mmol/L (ref 135–146)
Total Protein: 7.4 g/dL (ref 6.1–8.1)

## 2017-10-06 LAB — TSH: TSH: 1.99 mIU/L (ref 0.40–4.50)

## 2017-10-06 LAB — HEMOGLOBIN A1C
EAG (MMOL/L): 5.7 (calc)
HEMOGLOBIN A1C: 5.2 %{Hb} (ref <5.7)
MEAN PLASMA GLUCOSE: 103 (calc)

## 2017-10-06 LAB — INSULIN, RANDOM: Insulin: 20.8 u[IU]/mL — ABNORMAL HIGH (ref 2.0–19.6)

## 2017-10-06 LAB — VITAMIN D 25 HYDROXY (VIT D DEFICIENCY, FRACTURES): VIT D 25 HYDROXY: 64 ng/mL (ref 30–100)

## 2017-10-06 LAB — MAGNESIUM: MAGNESIUM: 2.7 mg/dL — AB (ref 1.5–2.5)

## 2017-10-10 ENCOUNTER — Encounter: Payer: Self-pay | Admitting: Internal Medicine

## 2017-10-29 DIAGNOSIS — E782 Mixed hyperlipidemia: Secondary | ICD-10-CM | POA: Diagnosis not present

## 2017-10-29 DIAGNOSIS — E785 Hyperlipidemia, unspecified: Secondary | ICD-10-CM | POA: Diagnosis not present

## 2017-10-29 DIAGNOSIS — Z7982 Long term (current) use of aspirin: Secondary | ICD-10-CM | POA: Diagnosis not present

## 2017-10-29 DIAGNOSIS — I480 Paroxysmal atrial fibrillation: Secondary | ICD-10-CM | POA: Diagnosis not present

## 2017-10-29 DIAGNOSIS — I10 Essential (primary) hypertension: Secondary | ICD-10-CM | POA: Diagnosis not present

## 2017-10-29 DIAGNOSIS — I4891 Unspecified atrial fibrillation: Secondary | ICD-10-CM | POA: Diagnosis not present

## 2017-11-11 ENCOUNTER — Other Ambulatory Visit: Payer: Self-pay | Admitting: Internal Medicine

## 2017-11-18 DIAGNOSIS — H40003 Preglaucoma, unspecified, bilateral: Secondary | ICD-10-CM | POA: Diagnosis not present

## 2017-11-24 ENCOUNTER — Other Ambulatory Visit: Payer: Self-pay | Admitting: Internal Medicine

## 2017-11-24 DIAGNOSIS — I10 Essential (primary) hypertension: Secondary | ICD-10-CM

## 2017-11-24 MED ORDER — TELMISARTAN 80 MG PO TABS: ORAL_TABLET | ORAL | 3 refills | Status: DC

## 2017-12-03 DIAGNOSIS — Z23 Encounter for immunization: Secondary | ICD-10-CM | POA: Diagnosis not present

## 2017-12-13 ENCOUNTER — Other Ambulatory Visit: Payer: Self-pay | Admitting: *Deleted

## 2017-12-13 DIAGNOSIS — I10 Essential (primary) hypertension: Secondary | ICD-10-CM

## 2017-12-13 MED ORDER — LOSARTAN POTASSIUM 100 MG PO TABS: ORAL_TABLET | ORAL | 1 refills | Status: DC

## 2017-12-13 MED ORDER — HYDROCHLOROTHIAZIDE 25 MG PO TABS: ORAL_TABLET | ORAL | 1 refills | Status: DC

## 2017-12-13 NOTE — Telephone Encounter (Signed)
Patient's spouse called and reported the patient's pulse had been elevated in the 90's, since starting the Telmisartan. The patient is also taking Atenolol 50 mg daily.  Per Dr Melford Aase, the patient was advised to increase the Atenolol to 100 mg daily, to reduce the pulse. Per Dr Melford Aase the Telmisartan was discontinued and new RX's for losartan 100 mg daily and HCTZ 25 mg daily, were sent in as the spouse requested. The Hyzaar was not available.

## 2017-12-15 DIAGNOSIS — H40003 Preglaucoma, unspecified, bilateral: Secondary | ICD-10-CM | POA: Diagnosis not present

## 2018-01-05 DIAGNOSIS — Z79899 Other long term (current) drug therapy: Secondary | ICD-10-CM | POA: Diagnosis not present

## 2018-01-05 DIAGNOSIS — I480 Paroxysmal atrial fibrillation: Secondary | ICD-10-CM | POA: Diagnosis not present

## 2018-01-05 DIAGNOSIS — Z7982 Long term (current) use of aspirin: Secondary | ICD-10-CM | POA: Diagnosis not present

## 2018-01-05 DIAGNOSIS — I4891 Unspecified atrial fibrillation: Secondary | ICD-10-CM | POA: Diagnosis not present

## 2018-01-05 DIAGNOSIS — I10 Essential (primary) hypertension: Secondary | ICD-10-CM | POA: Diagnosis not present

## 2018-01-05 DIAGNOSIS — I4892 Unspecified atrial flutter: Secondary | ICD-10-CM | POA: Diagnosis not present

## 2018-01-05 DIAGNOSIS — E782 Mixed hyperlipidemia: Secondary | ICD-10-CM | POA: Diagnosis not present

## 2018-01-06 DIAGNOSIS — I4589 Other specified conduction disorders: Secondary | ICD-10-CM | POA: Diagnosis not present

## 2018-01-06 DIAGNOSIS — I440 Atrioventricular block, first degree: Secondary | ICD-10-CM | POA: Diagnosis not present

## 2018-01-06 DIAGNOSIS — R9431 Abnormal electrocardiogram [ECG] [EKG]: Secondary | ICD-10-CM | POA: Diagnosis not present

## 2018-01-07 ENCOUNTER — Ambulatory Visit: Payer: Self-pay | Admitting: Adult Health

## 2018-01-07 ENCOUNTER — Ambulatory Visit: Payer: Self-pay | Admitting: Physician Assistant

## 2018-01-28 DIAGNOSIS — L812 Freckles: Secondary | ICD-10-CM | POA: Diagnosis not present

## 2018-01-28 DIAGNOSIS — D1801 Hemangioma of skin and subcutaneous tissue: Secondary | ICD-10-CM | POA: Diagnosis not present

## 2018-01-28 DIAGNOSIS — D225 Melanocytic nevi of trunk: Secondary | ICD-10-CM | POA: Diagnosis not present

## 2018-01-28 DIAGNOSIS — L821 Other seborrheic keratosis: Secondary | ICD-10-CM | POA: Diagnosis not present

## 2018-01-28 DIAGNOSIS — L820 Inflamed seborrheic keratosis: Secondary | ICD-10-CM | POA: Diagnosis not present

## 2018-02-24 ENCOUNTER — Other Ambulatory Visit: Payer: Self-pay | Admitting: Internal Medicine

## 2018-03-02 DIAGNOSIS — I10 Essential (primary) hypertension: Secondary | ICD-10-CM | POA: Diagnosis not present

## 2018-03-02 DIAGNOSIS — C61 Malignant neoplasm of prostate: Secondary | ICD-10-CM | POA: Diagnosis not present

## 2018-03-02 DIAGNOSIS — I4891 Unspecified atrial fibrillation: Secondary | ICD-10-CM | POA: Diagnosis not present

## 2018-03-02 DIAGNOSIS — I4892 Unspecified atrial flutter: Secondary | ICD-10-CM | POA: Diagnosis not present

## 2018-03-02 DIAGNOSIS — I480 Paroxysmal atrial fibrillation: Secondary | ICD-10-CM | POA: Diagnosis not present

## 2018-03-02 DIAGNOSIS — Z79899 Other long term (current) drug therapy: Secondary | ICD-10-CM | POA: Diagnosis not present

## 2018-03-02 DIAGNOSIS — E785 Hyperlipidemia, unspecified: Secondary | ICD-10-CM | POA: Diagnosis not present

## 2018-03-03 DIAGNOSIS — I440 Atrioventricular block, first degree: Secondary | ICD-10-CM | POA: Diagnosis not present

## 2018-03-03 DIAGNOSIS — I499 Cardiac arrhythmia, unspecified: Secondary | ICD-10-CM | POA: Diagnosis not present

## 2018-03-03 DIAGNOSIS — I4589 Other specified conduction disorders: Secondary | ICD-10-CM | POA: Diagnosis not present

## 2018-03-03 DIAGNOSIS — R001 Bradycardia, unspecified: Secondary | ICD-10-CM | POA: Diagnosis not present

## 2018-03-11 ENCOUNTER — Encounter: Payer: Self-pay | Admitting: Internal Medicine

## 2018-04-13 ENCOUNTER — Encounter: Payer: Self-pay | Admitting: Internal Medicine

## 2018-04-13 ENCOUNTER — Ambulatory Visit (INDEPENDENT_AMBULATORY_CARE_PROVIDER_SITE_OTHER): Payer: Medicare Other | Admitting: Internal Medicine

## 2018-04-13 VITALS — BP 126/84 | HR 56 | Temp 97.6°F | Resp 18 | Ht 78.25 in | Wt 241.0 lb

## 2018-04-13 DIAGNOSIS — N401 Enlarged prostate with lower urinary tract symptoms: Secondary | ICD-10-CM

## 2018-04-13 DIAGNOSIS — Z136 Encounter for screening for cardiovascular disorders: Secondary | ICD-10-CM

## 2018-04-13 DIAGNOSIS — Z79899 Other long term (current) drug therapy: Secondary | ICD-10-CM

## 2018-04-13 DIAGNOSIS — Z8546 Personal history of malignant neoplasm of prostate: Secondary | ICD-10-CM

## 2018-04-13 DIAGNOSIS — R7309 Other abnormal glucose: Secondary | ICD-10-CM

## 2018-04-13 DIAGNOSIS — Z1211 Encounter for screening for malignant neoplasm of colon: Secondary | ICD-10-CM

## 2018-04-13 DIAGNOSIS — E782 Mixed hyperlipidemia: Secondary | ICD-10-CM | POA: Diagnosis not present

## 2018-04-13 DIAGNOSIS — I480 Paroxysmal atrial fibrillation: Secondary | ICD-10-CM | POA: Diagnosis not present

## 2018-04-13 DIAGNOSIS — E559 Vitamin D deficiency, unspecified: Secondary | ICD-10-CM | POA: Diagnosis not present

## 2018-04-13 DIAGNOSIS — Z1212 Encounter for screening for malignant neoplasm of rectum: Secondary | ICD-10-CM

## 2018-04-13 DIAGNOSIS — I10 Essential (primary) hypertension: Secondary | ICD-10-CM

## 2018-04-13 DIAGNOSIS — N138 Other obstructive and reflux uropathy: Secondary | ICD-10-CM

## 2018-04-13 NOTE — Progress Notes (Signed)
[facility]   Unk Pinto, M.D.     Uvaldo Bristle. Silverio Lay, P.A.-C Liane Comber, [place]                [address] [place], [place] [address] Telephone [phone] Telefax [phone]  Comprehensive Evaluation & Examination     This very nice 73 y.o. MWM presents for a  comprehensive evaluation and management of multiple medical co-morbidities.  Patient has been followed for HTN, HLD, Prediabetes and Vitamin D Deficiency. Patient is post Radical Prostatectomy in 2014 by Dr Dutch Gray.     HTN predates circa 2006. Patient's BP has been controlled at home.  Today's BP is at goal - 126/84.  In 1988, he had a SAH & recovered w/o sequelae. He had a Negative Cardiolite in 2007. In Jan 2018, he had a negative heart cath evaluating new onset pAfib/Flutter. In Mar 2018, he had CV by Dr Wyline Copas in [facility] and has been on Tarnov since. Patient denies any cardiac symptoms as chest pain, palpitations, shortness of breath, dizziness or ankle swelling. He golfs or exercises daily.      Patient's hyperlipidemia is controlled with diet and medications. Patient denies myalgias or other medication SE's. Last lipids were at goal: Lab Results  Component Value Date   CHOL 193 10/05/2017   HDL 78 10/05/2017   LDLCALC 93 10/05/2017   TRIG 121 10/05/2017   CHOLHDL 2.5 10/05/2017      Patientis followed expectantly for hx/o glucose intolerance and patient denies reactive hypoglycemic symptoms, visual blurring, diabetic polys or paresthesias. Last A1c was Normal & at goal: Lab Results  Component Value Date   HGBA1C 5.2 10/05/2017       Finally, patient has history of Vitamin D Deficiency ("24" / 2008)  and last vitamin D was at goal: Lab Results  Component Value Date   VD25OH 64 10/05/2017   Current Outpatient Medications on File Prior to Visit  Medication Sig  . ALPRAZolam (XANAX) 1 MG tablet TAKE  1/2 TO 1 TABLET BY MOUTH ONLY AS NEEDED FOR SLEEP  . atenolol (TENORMIN) 50 MG tablet TAKE 1 TABLET DAILY **     DISCONTINUE ATENOLOL 25MG  **  . Cholecalciferol (VITAMIN D3) 5000 UNITS CAPS Take 5,000 Int'l Units by mouth daily.  . diphenhydrAMINE (BENADRYL) 25 mg capsule Take 25 mg by mouth at bedtime as needed for sleep.  . flecainide (TAMBOCOR) 100 MG tablet Take 100 mg by mouth 2 (two) times daily.  . Glucosamine-Chondroit-Vit C-Mn (GLUCOSAMINE 1500 COMPLEX PO) Take by mouth daily.  . hydrochlorothiazide (HYDRODIURIL) 25 MG tablet Take 1 tablet daily for BP and fluid.  Marland Kitchen losartan (COZAAR) 100 MG tablet Take 1 tablet daily for blood pressure.  . simvastatin (ZOCOR) 80 MG tablet Take 1 tablet (80 mg total) by mouth daily.  . valACYclovir (VALTREX) 500 MG tablet TAKE 1 TABLET EVERY DAY  FOR  FEVER  BLISTERS AS DIRECTED   No current facility-administered medications on file prior to visit.    Allergies  Allergen Reactions  . Ultram [Tramadol] Anaphylaxis   Past Medical History:  Diagnosis Date  . Anxiety   . Hyperlipidemia   . Hypertension   . Other testicular hypofunction   . Vitamin D deficiency    Health Maintenance  Topic Date Due  . INFLUENZA VACCINE  09/23/2017  . COLONOSCOPY  03/17/2022  . Hepatitis C Screening  Completed  . PNA vac Low Risk Adult  Completed  . TETANUS/TDAP  Discontinued   Immunization History  Administered Date(s) Administered  . Influenza, High Dose Seasonal PF 01/31/2015  . Influenza-Unspecified 12/24/2016  . MMR 02/23/2005  . Pneumococcal Conjugate-13 01/31/2015  . Pneumococcal Polysaccharide-23 02/23/2010, 03/30/2016  . Tdap 02/23/2005   Last Colon -  Past Surgical History:  Procedure Laterality Date  . COLONOSCOPY    . LYMPHADENECTOMY Bilateral 06/23/2012   Procedure: LYMPHADENECTOMY;  Surgeon: Dutch Gray, MD;  Location: [facility];  Service: Urology;  Laterality: Bilateral;  . ROBOT ASSISTED LAPAROSCOPIC RADICAL PROSTATECTOMY N/A 06/23/2012    Procedure: ROBOTIC ASSISTED LAPAROSCOPIC RADICAL PROSTATECTOMY LEVEL 2;  Surgeon: Dutch Gray, MD;  Location: [facility];  Service: Urology;  Laterality: N/A;   Family History  Problem Relation Age of Onset  . Colon cancer Father   . Esophageal cancer Neg Hx   . Rectal cancer Neg Hx   . Stomach cancer Neg Hx    Social History   Socioeconomic History  . Marital status: Married    Spouse name: Not on file  . Number of children: Not on file  Occupational History  . Retired Agricultural engineer  Tobacco Use  . Smoking status: Never Smoker  . Smokeless tobacco: Never Used  Substance and Sexual Activity  . Alcohol use: Yes    Alcohol/week: 5.0 standard drinks    Types: 5 Cans of beer per week    Comment: occ  . Drug use: No  . Sexual activity: Not on file    ROS Constitutional: Denies fever, chills, weight loss/gain, headaches, insomnia,  night sweats or change in appetite. Does c/o fatigue. Eyes: Denies redness, blurred vision, diplopia, discharge, itchy or watery eyes.  ENT: Denies discharge, congestion, post nasal drip, epistaxis, sore throat, earache, hearing loss, dental pain, Tinnitus, Vertigo, Sinus pain or snoring.  Cardio: Denies chest pain, palpitations, irregular heartbeat, syncope, dyspnea, diaphoresis, orthopnea, PND, claudication or edema Respiratory: denies cough, dyspnea, DOE, pleurisy, hoarseness, laryngitis or wheezing.  Gastrointestinal: Denies dysphagia, heartburn, reflux, water brash, pain, cramps, nausea, vomiting, bloating, diarrhea, constipation, hematemesis, melena, hematochezia, jaundice or hemorrhoids Genitourinary: Denies dysuria, frequency, urgency, nocturia, hesitancy, discharge, hematuria or flank pain Musculoskeletal: Denies arthralgia, myalgia, stiffness, Jt. Swelling, pain, limp or strain/sprain. Denies Falls. Skin: Denies puritis, rash, hives, warts, acne, eczema or change in skin lesion Neuro: No weakness, tremor, incoordination, spasms, paresthesia or  pain Psychiatric: Denies confusion, memory loss or sensory loss. Denies Depression. Endocrine: Denies change in weight, skin, hair change, nocturia, and paresthesia, diabetic polys, visual blurring or hyper / hypo glycemic episodes.  Heme/Lymph: No excessive bleeding, bruising or enlarged lymph nodes.  Physical Exam  BP 126/84   Pulse (!) 56   Temp 97.6 F (36.4 C)   Resp 18   Ht 6' 6.25" (1.988 m)   Wt 241 lb (109.3 kg)   BMI 27.67 kg/m   Postural Sitting BP 122/79   P 59    And    Standing BP   112/80    P 60  General Appearance: Well nourished and well groomed and in no apparent distress.  Eyes: PERRLA, EOMs, conjunctiva no swelling or erythema, normal fundi and vessels. Sinuses: No frontal/maxillary tenderness ENT/Mouth: EACs patent / TMs  nl. Nares clear without erythema, swelling, mucoid exudates. Oral hygiene is good. No erythema, swelling, or exudate. Tongue normal, non-obstructing. Tonsils not swollen or erythematous. Hearing normal.  Neck: Supple, thyroid not palpable. No  bruits, nodes or JVD. Respiratory: Respiratory effort normal.  BS equal and clear bilateral without rales, rhonci, wheezing or stridor. Cardio: Heart sounds are normal with regular rate and rhythm and no murmurs, rubs or gallops. Peripheral pulses are normal and equal bilaterally without edema. No aortic or femoral bruits. Chest: symmetric with normal excursions and percussion.  Abdomen: Soft, with Nl bowel sounds. Nontender, no guarding, rebound, hernias, masses, or organomegaly.  Lymphatics: Non tender without lymphadenopathy.  Genitourinary:  Deferred to Dr [name]. Musculoskeletal: Full ROM all peripheral extremities, joint stability, 5/5 strength, and normal gait. Skin: Warm and dry without rashes, lesions, cyanosis, clubbing or  ecchymosis.  Neuro: Cranial nerves intact, reflexes equal bilaterally. Normal muscle tone, no cerebellar symptoms. Sensation intact.  Pysch: Alert and oriented X 3 with  normal affect, insight and judgment appropriate.   Assessment and Plan  1. Essential hypertension  - EKG 12-Lead - [place], RETROPERITNL ABD,  LTD - Urinalysis, Routine w reflex microscopic - Microalbumin / creatinine urine ratio - CBC with Differential/Platelet - COMPLETE METABOLIC PANEL WITH GFR - Magnesium - TSH  2. Hyperlipidemia, mixed  - EKG 12-Lead - [place], RETROPERITNL ABD,  LTD - Lipid panel - TSH  3. Abnormal glucose  - EKG 12-Lead - [place], RETROPERITNL ABD,  LTD - Hemoglobin A1c - Insulin, random  4. Vitamin D deficiency  - VITAMIN D 25 Hydroxyl  5. Paroxysmal atrial fibrillation (HCC)  - EKG 12-Lead - TSH  6. History of prostate cancer  - PSA  7. Screening for ischemic heart disease  - EKG 12-Lead  8. Screening for AAA (aortic abdominal aneurysm)  - [place], RETROPERITNL ABD,  LTD  9. Screening for colorectal cancer  - POC Hemoccult Bld/Stl  10. Medication management  - Urinalysis, Routine w reflex microscopic - Microalbumin / creatinine urine ratio - CBC with Differential/Platelet - COMPLETE METABOLIC PANEL WITH GFR - Magnesium - Lipid panel - TSH - Hemoglobin A1c - Insulin, random - VITAMIN D 25 Hydroxyl  11. BPH with obstruction/lower urinary tract symptoms  - PSA             Patient was counseled in prudent diet, weight control to achieve/maintain BMI less than 25, BP monitoring, regular exercise and medications as discussed.  Discussed med effects and SE's. Routine screening labs and tests as requested with regular follow-up as recommended. Over 40 minutes of exam, counseling, chart review and high complex critical decision making was performed

## 2018-04-13 NOTE — Patient Instructions (Signed)

## 2018-04-14 LAB — CBC WITH DIFFERENTIAL/PLATELET
Absolute Monocytes: 497 cells/uL (ref 200–950)
BASOS ABS: 48 {cells}/uL (ref 0–200)
Basophils Relative: 1.1 %
EOS PCT: 5.7 %
Eosinophils Absolute: 251 cells/uL (ref 15–500)
HEMATOCRIT: 42.1 % (ref 38.5–50.0)
Hemoglobin: 14.5 g/dL (ref 13.2–17.1)
LYMPHS ABS: 1113 {cells}/uL (ref 850–3900)
MCH: 33.7 pg — ABNORMAL HIGH (ref 27.0–33.0)
MCHC: 34.4 g/dL (ref 32.0–36.0)
MCV: 97.9 fL (ref 80.0–100.0)
MONOS PCT: 11.3 %
MPV: 9.7 fL (ref 7.5–12.5)
NEUTROS PCT: 56.6 %
Neutro Abs: 2490 cells/uL (ref 1500–7800)
Platelets: 208 10*3/uL (ref 140–400)
RBC: 4.3 10*6/uL (ref 4.20–5.80)
RDW: 12 % (ref 11.0–15.0)
Total Lymphocyte: 25.3 %
WBC: 4.4 10*3/uL (ref 3.8–10.8)

## 2018-04-14 LAB — MAGNESIUM: Magnesium: 2.2 mg/dL (ref 1.5–2.5)

## 2018-04-14 LAB — LIPID PANEL
CHOL/HDL RATIO: 2 (calc) (ref <5.0)
Cholesterol: 168 mg/dL (ref <200)
HDL: 83 mg/dL (ref >=40)
LDL Cholesterol (Calc): 71 mg/dL (calc)
Non-HDL Cholesterol (Calc): 85 mg/dL (calc) (ref <130)
Triglycerides: 60 mg/dL (ref <150)

## 2018-04-14 LAB — COMPLETE METABOLIC PANEL WITH GFR
AG Ratio: 1.9 (calc) (ref 1.0–2.5)
ALT: 15 U/L (ref 9–46)
AST: 23 U/L (ref 10–35)
Albumin: 4.5 g/dL (ref 3.6–5.1)
Alkaline phosphatase (APISO): 65 U/L (ref 35–144)
BUN / CREAT RATIO: 14 (calc) (ref 6–22)
BUN: 17 mg/dL (ref 7–25)
CO2: 29 mmol/L (ref 20–32)
CREATININE: 1.23 mg/dL — AB (ref 0.70–1.18)
Calcium: 9.6 mg/dL (ref 8.6–10.3)
Chloride: 103 mmol/L (ref 98–110)
GFR, EST AFRICAN AMERICAN: 68 mL/min/{1.73_m2} (ref >=60)
GFR, Est Non African American: 58 mL/min/{1.73_m2} — ABNORMAL LOW (ref >=60)
GLOBULIN: 2.4 g/dL (ref 1.9–3.7)
Glucose, Bld: 105 mg/dL — ABNORMAL HIGH (ref 65–99)
Potassium: 4.4 mmol/L (ref 3.5–5.3)
Sodium: 140 mmol/L (ref 135–146)
Total Bilirubin: 3.4 mg/dL — ABNORMAL HIGH (ref 0.2–1.2)
Total Protein: 6.9 g/dL (ref 6.1–8.1)

## 2018-04-14 LAB — HEMOGLOBIN A1C
EAG (MMOL/L): 5.2 (calc)
Hgb A1c MFr Bld: 4.9 % of total Hgb (ref <5.7)
Mean Plasma Glucose: 94 (calc)

## 2018-04-14 LAB — MICROALBUMIN / CREATININE URINE RATIO
Creatinine, Urine: 76 mg/dL (ref 20–320)
Microalb Creat Ratio: 34 mcg/mg creat — ABNORMAL HIGH (ref <30)
Microalb, Ur: 2.6 mg/dL

## 2018-04-14 LAB — URINALYSIS, ROUTINE W REFLEX MICROSCOPIC
BILIRUBIN URINE: NEGATIVE
Glucose, UA: NEGATIVE
Hgb urine dipstick: NEGATIVE
KETONES UR: NEGATIVE
Leukocytes,Ua: NEGATIVE
Nitrite: NEGATIVE
PROTEIN: NEGATIVE
Specific Gravity, Urine: 1.011 (ref 1.001–1.03)
pH: 6.5 (ref 5.0–8.0)

## 2018-04-14 LAB — INSULIN, RANDOM: Insulin: 7.5 u[IU]/mL

## 2018-04-14 LAB — VITAMIN D 25 HYDROXY (VIT D DEFICIENCY, FRACTURES): Vit D, 25-Hydroxy: 65 ng/mL (ref 30–100)

## 2018-04-14 LAB — PSA: PSA: <0.1 ng/mL (ref <=4.0)

## 2018-04-14 LAB — TSH: TSH: 2.05 mIU/L (ref 0.40–4.50)

## 2018-04-23 DIAGNOSIS — I4892 Unspecified atrial flutter: Secondary | ICD-10-CM | POA: Diagnosis not present

## 2018-04-29 DIAGNOSIS — I4892 Unspecified atrial flutter: Secondary | ICD-10-CM | POA: Diagnosis not present

## 2018-05-12 ENCOUNTER — Other Ambulatory Visit: Payer: Self-pay | Admitting: Adult Health

## 2018-05-30 ENCOUNTER — Other Ambulatory Visit: Payer: Self-pay | Admitting: Internal Medicine

## 2018-05-30 DIAGNOSIS — E782 Mixed hyperlipidemia: Secondary | ICD-10-CM

## 2018-07-02 ENCOUNTER — Other Ambulatory Visit: Payer: Self-pay | Admitting: Internal Medicine

## 2018-07-06 DIAGNOSIS — I480 Paroxysmal atrial fibrillation: Secondary | ICD-10-CM | POA: Diagnosis not present

## 2018-07-06 DIAGNOSIS — I4892 Unspecified atrial flutter: Secondary | ICD-10-CM | POA: Diagnosis not present

## 2018-07-06 DIAGNOSIS — I10 Essential (primary) hypertension: Secondary | ICD-10-CM | POA: Diagnosis not present

## 2018-07-11 DIAGNOSIS — C61 Malignant neoplasm of prostate: Secondary | ICD-10-CM | POA: Diagnosis not present

## 2018-07-15 DIAGNOSIS — I454 Nonspecific intraventricular block: Secondary | ICD-10-CM | POA: Diagnosis not present

## 2018-07-15 DIAGNOSIS — I4892 Unspecified atrial flutter: Secondary | ICD-10-CM | POA: Diagnosis not present

## 2018-07-15 DIAGNOSIS — I480 Paroxysmal atrial fibrillation: Secondary | ICD-10-CM | POA: Diagnosis not present

## 2018-07-16 DIAGNOSIS — R001 Bradycardia, unspecified: Secondary | ICD-10-CM | POA: Diagnosis not present

## 2018-07-16 DIAGNOSIS — I440 Atrioventricular block, first degree: Secondary | ICD-10-CM | POA: Diagnosis not present

## 2018-07-28 DIAGNOSIS — N182 Chronic kidney disease, stage 2 (mild): Secondary | ICD-10-CM | POA: Insufficient documentation

## 2018-07-28 DIAGNOSIS — N183 Chronic kidney disease, stage 3 unspecified: Secondary | ICD-10-CM | POA: Insufficient documentation

## 2018-07-28 NOTE — Progress Notes (Signed)
Virtual Visit via Telephone Note  I connected with Kenneth Russell on 07/29/18 at  8:45 AM EDT by telephone and verified that I am speaking with the correct person using two identifiers.  Location: Patient: home Provider: [place] office   I discussed the limitations, risks, security and privacy concerns of performing an evaluation and management service by telephone and the availability of in person appointments. I also discussed with the patient that there may be a patient responsible charge related to this service. The patient expressed understanding and agreed to proceed.  I discussed the assessment and treatment plan with the patient. The patient was provided an opportunity to ask questions and all were answered. The patient agreed with the plan and demonstrated an understanding of the instructions.   The patient was advised to call back or seek an in-person evaluation if the symptoms worsen or if the condition fails to improve as anticipated.  I provided 22 minutes of non-face-to-face time during this encounter.   Izora Ribas, NP    MEDICARE ANNUAL WELLNESS VISIT AND FOLLOW UP Assessment:   Diagnoses and all orders for this visit:  Encounter for Medicare annual wellness exam Due annually  Paroxysmal atrial fibrillation ([place]) Controlled with flecainide Chadsvasc 2; on elequis Followed by cardiology  Essential hypertension Continue medication Monitor blood pressure at home; call if consistently over 130/80 Continue DASH diet.   Reminder to go to the ER if any CP, SOB, nausea, dizziness, severe HA, changes vision/speech, left arm numbness and tingling and jaw pain.  Atrial flutter, unspecified type ([place]) Rate controlled by flecainide; followed by cardiology Underwent repeat CV in 06/2018 and doing well   Prostate cancer [facility]) S/p prostatectomy; followed by Dr. [name]  Vitamin D deficiency At goal at recent check; continue to recommend supplementation for goal of  60-100 Defer vitamin D level  Medication management Monitor CBC, CMP, magnesium at routine OVs  Mixed hyperlipidemia Continue medicationss Continue low cholesterol diet and exercise.  Monitor lipid panel.   Glaucoma suspect, bilateral Followed by [name]  BMI 27.0-27.9,adult Continue to recommend diet heavy in fruits and veggies and low in animal meats, cheeses, and dairy products, appropriate calorie intake Discuss exercise recommendations routinely Continue to monitor weight at each visit  Anxiety Uses xanax sporadically PRN; Well managed by current regimen; reminded to avoid daily use of benzo Stress management techniques discussed, increase water, good sleep hygiene discussed, increase exercise, and increase veggies.   Abnormal glucose Recent A1Cs at goal Discussed diet/exercise, weight management  Defer A1C to CPE  CKD (chronic kidney disease) stage 3, GFR 30-59 ml/min (HCC) Increase fluids, avoid NSAIDS, monitor sugars, will monitor  Cold sores Valtrex PRN flare  Over 30 minutes of exam, counseling, chart review, and critical decision making was performed  Future Appointments  Date Time Provider [place]  11/03/2018  9:30 AM Unk Pinto, MD [facility] None  05/11/2019 10:00 AM Unk Pinto, MD [facility] None     Plan:   During the course of the visit the patient was educated and counseled about appropriate screening and preventive services including:    Pneumococcal vaccine   Influenza vaccine  Prevnar 13  Td vaccine  Screening electrocardiogram  Colorectal cancer screening  Diabetes screening  Glaucoma screening  Nutrition counseling    Subjective:  Kenneth Russell is a 73 y.o. male who presents for Medicare Annual Wellness Visit and 3 month follow up for HTN, hyperlipidemia, glucose management, and vitamin D Def.   Patient is S/p  Radical Prostatectomy in 2014 by Dr. [name], follows annually, no issues.   He had a  Negative Cardiolite in 2007. In Jan 2018, he had a negative heart cath evaluating new onset pAfib/Flutter. In Mar 2018, he had CV by Dr Wyline Copas in [facility] and has been on [place] since. Patient denies any cardiac symptoms. He golfs or exercises daily.  He reports he is now under the care of Dr. Ola Spurr and had repeat cardioversion 3 weeks ago after holter showed repeat a. flutter, feeling well since that time, will follow up in August 2020.   He has recurrent mouth sores, takes valtrex PRN for flares.   BMI is Body mass index is 26.41 kg/m., he has been working on diet and exercise. Very active, exercises at least 60 min daily, cycling, weights, tennis, golf.  Wt Readings from Last 3 Encounters:  07/29/18 230 lb (104.3 kg)  04/13/18 241 lb (109.3 kg)  10/05/17 229 lb 9.6 oz (104.1 kg)   His blood pressure has been controlled at home, today their BP is BP: 114/74 He does workout. He denies chest pain, shortness of breath, dizziness.   He is on cholesterol medication (simvastatin 40 mg daily) and denies myalgias. His cholesterol is at goal. The cholesterol last visit was:   Lab Results  Component Value Date   CHOL 168 04/13/2018   HDL 83 04/13/2018   LDLCALC 71 04/13/2018   TRIG 60 04/13/2018   CHOLHDL 2.0 04/13/2018   He has been working on diet and exercise for glucose management, and denies increased appetite, nausea, paresthesia of the feet, polydipsia, polyuria and visual disturbances. Last A1C in the office was:  Lab Results  Component Value Date   HGBA1C 4.9 04/13/2018   Last GFR Lab Results  Component Value Date   GFRNONAA 58 (L) 04/13/2018   Patient is on Vitamin D supplement.   Lab Results  Component Value Date   VD25OH 65 04/13/2018      Medication Review:   Current Outpatient Medications (Cardiovascular):  .  atenolol (TENORMIN) 50 MG tablet, TAKE 1 TABLET DAILY **     DISCONTINUE ATENOLOL '25MG'$   ** .  flecainide (TAMBOCOR) 100 MG tablet, Take 100  mg by mouth 2 (two) times daily. .  hydrochlorothiazide (HYDRODIURIL) 25 MG tablet, Take 1 tablet Daily for BP & Fluid .  losartan (COZAAR) 100 MG tablet, Take 1 tablet Daily for BP .  simvastatin (ZOCOR) 40 MG tablet, Take 1 tablet (40 mg total) by mouth daily at 6 PM.  Current Outpatient Medications (Respiratory):  .  diphenhydrAMINE (BENADRYL) 25 mg capsule, Take 25 mg by mouth at bedtime as needed for sleep.   Current Outpatient Medications (Hematological):  .  apixaban (ELIQUIS) 5 MG TABS tablet, Take 5 mg by mouth 2 (two) times a day.  Current Outpatient Medications (Other):  Marland Kitchen  ALPRAZolam (XANAX) 1 MG tablet, TAKE 1/2 TO 1 TABLET BY MOUTH ONLY AS NEEDED FOR SLEEP .  Cholecalciferol (VITAMIN D3) 5000 UNITS CAPS, Take 5,000 Int'l Units by mouth daily. .  Glucosamine-Chondroit-Vit C-Mn (GLUCOSAMINE 1500 COMPLEX PO), Take by mouth daily. .  valACYclovir (VALTREX) 500 MG tablet, TAKE 1 TABLET EVERY DAY  FOR  FEVER  BLISTERS AS DIRECTED  Allergies: Allergies  Allergen Reactions  . Ultram [Tramadol] Anaphylaxis    Current Problems (verified) has Hyperlipidemia; Hypertension; Anxiety; Vitamin D deficiency; Prostate cancer ([place]); Medication management; Abnormal glucose; BMI 27.0-27.9,adult; Atrial flutter ([place]); Glaucoma suspect, bilateral; Paroxysmal atrial fibrillation ([place]); and CKD (  chronic kidney disease) stage 3, GFR 30-59 ml/min (HCC) on their problem list.  Screening Tests Immunization History  Administered Date(s) Administered  . Influenza, High Dose Seasonal PF 01/31/2015  . Influenza-Unspecified 12/24/2016  . MMR 02/23/2005  . Pneumococcal Conjugate-13 01/31/2015  . Pneumococcal Polysaccharide-23 02/23/2010, 03/30/2016  . Tdap 02/23/2005    Preventative care: Last colonoscopy: 2014 due 2024  Prior vaccinations: TD or Tdap: 2007 will get next OV  Influenza: 2019 Pneumococcal: 2018 Prevnar13: 2016 Shingles/Zostavax:   Names of Other Physician/Practitioners you  currently use: 1. [facility] here for primary care 2. [name], eye doctor, last visit 2019, monitoring glaucoma, goes annualy 3. Dr. Gloriann Loan, dentist, last visit, last visit 2020  Patient Care Team: Unk Pinto, MD as PCP - General (Internal Medicine) Raynelle Bring, MD as Consulting Physician (Urology) Inda Castle, MD (Inactive) as Consulting Physician (Gastroenterology) Susa Day, MD as Consulting Physician (Orthopedic Surgery) Jarome Matin, MD as Consulting Physician (Dermatology) Bond, Tracie Harrier, MD as Referring Physician (Ophthalmology) Ebbie Ridge, MD as Referring Physician (Cardiology)  Surgical: He  has a past surgical history that includes Colonoscopy; Robot assisted laparoscopic radical prostatectomy (N/A, 06/23/2012); and Lymphadenectomy (Bilateral, 06/23/2012). Family His family history includes Colon cancer in his father. Social history  He reports that he has never smoked. He has never used smokeless tobacco. He reports current alcohol use of about 5.0 standard drinks of alcohol per week. He reports that he does not use drugs.  MEDICARE WELLNESS OBJECTIVES: Physical activity: Current Exercise Habits: Home exercise routine, Type of exercise: Other - see comments;strength training/weights, Time (Minutes): 60, Frequency (Times/Week): 7, Weekly Exercise (Minutes/Week): 420, Intensity: Mild, Exercise limited by: None identified Cardiac risk factors: Cardiac Risk Factors include: advanced age (>31mn, >>59women);male gender;dyslipidemia;hypertension Depression/mood screen:   Depression screen [facility]9 07/29/2018  Decreased Interest 0  Down, Depressed, Hopeless 0  PHQ - 2 Score 0    ADLs:  In your present state of health, do you have any difficulty performing the following activities: 07/29/2018 04/13/2018  Hearing? N N  Vision? N N  Difficulty concentrating or making decisions? N N  Walking or climbing stairs? N N   Dressing or bathing? N N  Doing errands, shopping? N N  Some recent data might be hidden     Cognitive Testing  Alert? Yes  Normal Appearance?Yes  Oriented to person? Yes  Place? Yes   Time? Yes  Recall of three objects?  Yes  Can perform simple calculations? Yes  Displays appropriate judgment?Yes  Can read the correct time from a watch face?Yes  EOL planning: Does Patient Have a Medical Advance Directive?: Yes Type of Advance Directive: Healthcare Power of Attorney, Living will Does patient want to make changes to medical advance directive?: No - Patient declined [place] of [place]?: No - copy requested Would patient like information on creating a medical advance directive?: No - Patient declined   Objective:   Today's Vitals   07/29/18 0848  BP: 114/74  Pulse: 65  Temp: 98 F (36.7 C)  Weight: 230 lb (104.3 kg)   Body mass index is 26.41 kg/m.  General : Well sounding patient in no apparent distress HEENT: no hoarseness, no cough for duration of visit Lungs: speaks in complete sentences, no audible wheezing, no apparent distress Neurological: alert, oriented x 3 Psychiatric: pleasant, judgement appropriate   Medicare Attestation I have personally reviewed: The patient's medical and social history Their use of alcohol,  tobacco or illicit drugs Their current medications and supplements The patient's functional ability including ADLs,fall risks, home safety risks, cognitive, and hearing and visual impairment Diet and physical activities Evidence for depression or mood disorders  The patient's weight, height, BMI, and visual acuity have been recorded in the chart.  I have made referrals, counseling, and provided education to the patient based on review of the above and I have provided the patient with a written personalized care plan for preventive services.     Izora Ribas, NP   07/29/2018

## 2018-07-29 ENCOUNTER — Encounter: Payer: Self-pay | Admitting: Adult Health

## 2018-07-29 ENCOUNTER — Ambulatory Visit: Payer: Medicare Other | Admitting: Adult Health

## 2018-07-29 ENCOUNTER — Other Ambulatory Visit: Payer: Self-pay

## 2018-07-29 VITALS — BP 114/74 | HR 65 | Temp 98.0°F | Wt 230.0 lb

## 2018-07-29 DIAGNOSIS — I4892 Unspecified atrial flutter: Secondary | ICD-10-CM

## 2018-07-29 DIAGNOSIS — B001 Herpesviral vesicular dermatitis: Secondary | ICD-10-CM

## 2018-07-29 DIAGNOSIS — C61 Malignant neoplasm of prostate: Secondary | ICD-10-CM | POA: Diagnosis not present

## 2018-07-29 DIAGNOSIS — I10 Essential (primary) hypertension: Secondary | ICD-10-CM | POA: Diagnosis not present

## 2018-07-29 DIAGNOSIS — I480 Paroxysmal atrial fibrillation: Secondary | ICD-10-CM | POA: Diagnosis not present

## 2018-07-29 DIAGNOSIS — N183 Chronic kidney disease, stage 3 unspecified: Secondary | ICD-10-CM

## 2018-07-29 DIAGNOSIS — E782 Mixed hyperlipidemia: Secondary | ICD-10-CM | POA: Diagnosis not present

## 2018-07-29 DIAGNOSIS — Z6827 Body mass index (BMI) 27.0-27.9, adult: Secondary | ICD-10-CM | POA: Diagnosis not present

## 2018-07-29 DIAGNOSIS — H40003 Preglaucoma, unspecified, bilateral: Secondary | ICD-10-CM

## 2018-07-29 DIAGNOSIS — Z79899 Other long term (current) drug therapy: Secondary | ICD-10-CM | POA: Diagnosis not present

## 2018-07-29 DIAGNOSIS — F419 Anxiety disorder, unspecified: Secondary | ICD-10-CM | POA: Diagnosis not present

## 2018-07-29 DIAGNOSIS — R7309 Other abnormal glucose: Secondary | ICD-10-CM

## 2018-07-29 DIAGNOSIS — E559 Vitamin D deficiency, unspecified: Secondary | ICD-10-CM

## 2018-07-29 DIAGNOSIS — Z0000 Encounter for general adult medical examination without abnormal findings: Secondary | ICD-10-CM

## 2018-07-29 MED ORDER — SIMVASTATIN 40 MG PO TABS: 40.0000 mg | ORAL_TABLET | Freq: Every day | ORAL | 1 refills | Status: DC

## 2018-08-03 DIAGNOSIS — C61 Malignant neoplasm of prostate: Secondary | ICD-10-CM | POA: Diagnosis not present

## 2018-08-03 DIAGNOSIS — N5201 Erectile dysfunction due to arterial insufficiency: Secondary | ICD-10-CM | POA: Diagnosis not present

## 2018-10-06 ENCOUNTER — Other Ambulatory Visit: Payer: Self-pay | Admitting: *Deleted

## 2018-10-06 DIAGNOSIS — E782 Mixed hyperlipidemia: Secondary | ICD-10-CM

## 2018-10-06 MED ORDER — SIMVASTATIN 40 MG PO TABS: ORAL_TABLET | ORAL | 1 refills | Status: DC

## 2018-10-11 ENCOUNTER — Other Ambulatory Visit: Payer: Self-pay | Admitting: *Deleted

## 2018-10-11 DIAGNOSIS — E782 Mixed hyperlipidemia: Secondary | ICD-10-CM

## 2018-10-11 MED ORDER — SIMVASTATIN 40 MG PO TABS: ORAL_TABLET | ORAL | 1 refills | Status: DC

## 2018-10-26 DIAGNOSIS — I4892 Unspecified atrial flutter: Secondary | ICD-10-CM | POA: Diagnosis not present

## 2018-10-26 DIAGNOSIS — E782 Mixed hyperlipidemia: Secondary | ICD-10-CM | POA: Diagnosis not present

## 2018-10-26 DIAGNOSIS — R9431 Abnormal electrocardiogram [ECG] [EKG]: Secondary | ICD-10-CM | POA: Diagnosis not present

## 2018-10-26 DIAGNOSIS — I10 Essential (primary) hypertension: Secondary | ICD-10-CM | POA: Diagnosis not present

## 2018-10-26 DIAGNOSIS — I484 Atypical atrial flutter: Secondary | ICD-10-CM | POA: Diagnosis not present

## 2018-10-26 DIAGNOSIS — I480 Paroxysmal atrial fibrillation: Secondary | ICD-10-CM | POA: Diagnosis not present

## 2018-10-27 DIAGNOSIS — C61 Malignant neoplasm of prostate: Secondary | ICD-10-CM | POA: Diagnosis not present

## 2018-10-27 DIAGNOSIS — I484 Atypical atrial flutter: Secondary | ICD-10-CM | POA: Diagnosis not present

## 2018-10-27 DIAGNOSIS — I10 Essential (primary) hypertension: Secondary | ICD-10-CM | POA: Diagnosis not present

## 2018-11-03 ENCOUNTER — Ambulatory Visit: Payer: Self-pay | Admitting: Internal Medicine

## 2018-11-03 ENCOUNTER — Ambulatory Visit: Payer: Self-pay | Admitting: Adult Health Nurse Practitioner

## 2018-11-04 DIAGNOSIS — Z23 Encounter for immunization: Secondary | ICD-10-CM | POA: Diagnosis not present

## 2018-12-29 ENCOUNTER — Other Ambulatory Visit: Payer: Self-pay | Admitting: Physician Assistant

## 2019-02-01 DIAGNOSIS — C44519 Basal cell carcinoma of skin of other part of trunk: Secondary | ICD-10-CM | POA: Diagnosis not present

## 2019-02-01 DIAGNOSIS — L812 Freckles: Secondary | ICD-10-CM | POA: Diagnosis not present

## 2019-02-01 DIAGNOSIS — D485 Neoplasm of uncertain behavior of skin: Secondary | ICD-10-CM | POA: Diagnosis not present

## 2019-02-01 DIAGNOSIS — L570 Actinic keratosis: Secondary | ICD-10-CM | POA: Diagnosis not present

## 2019-02-01 DIAGNOSIS — D1801 Hemangioma of skin and subcutaneous tissue: Secondary | ICD-10-CM | POA: Diagnosis not present

## 2019-02-01 DIAGNOSIS — L821 Other seborrheic keratosis: Secondary | ICD-10-CM | POA: Diagnosis not present

## 2019-03-17 ENCOUNTER — Other Ambulatory Visit: Payer: Self-pay | Admitting: Internal Medicine

## 2019-03-17 DIAGNOSIS — E782 Mixed hyperlipidemia: Secondary | ICD-10-CM

## 2019-03-19 DIAGNOSIS — Z23 Encounter for immunization: Secondary | ICD-10-CM | POA: Diagnosis not present

## 2019-03-20 ENCOUNTER — Ambulatory Visit: Payer: Commercial Managed Care - PPO

## 2019-03-21 ENCOUNTER — Ambulatory Visit: Payer: Commercial Managed Care - PPO

## 2019-03-30 ENCOUNTER — Ambulatory Visit: Payer: Commercial Managed Care - PPO

## 2019-04-10 DIAGNOSIS — H40003 Preglaucoma, unspecified, bilateral: Secondary | ICD-10-CM | POA: Diagnosis not present

## 2019-04-11 ENCOUNTER — Ambulatory Visit: Payer: Commercial Managed Care - PPO

## 2019-04-11 DIAGNOSIS — Z23 Encounter for immunization: Secondary | ICD-10-CM | POA: Diagnosis not present

## 2019-04-14 ENCOUNTER — Other Ambulatory Visit: Payer: Self-pay

## 2019-04-14 MED ORDER — VALACYCLOVIR HCL 500 MG PO TABS: ORAL_TABLET | ORAL | 0 refills | Status: DC

## 2019-04-19 ENCOUNTER — Other Ambulatory Visit: Payer: Self-pay | Admitting: Internal Medicine

## 2019-05-10 ENCOUNTER — Encounter: Payer: Self-pay | Admitting: Internal Medicine

## 2019-05-10 NOTE — Patient Instructions (Signed)

## 2019-05-10 NOTE — Progress Notes (Signed)
Annual  Screening/Preventative Visit  & Comprehensive Evaluation & Examination     This very nice 74 y.o.  MWM  presents for a Screening /Preventative Visit & comprehensive evaluation and management of multiple medical co-morbidities.  Patient has been followed for HTN, HLD, Prediabetes and Vitamin D Deficiency. In 2014, patient had a radical Prostatectomy by Dr [name].     HTN predates since 2006. Patient's BP has been controlled at home. Patient was hospitalized with a DISH in 1988 recovering w/o sequelae.   Today's BP is at goal - 124/60.  In 2007, patient had a Negative Cardiolite.  In Jan 2018, he was dx'd with pAfib/Flutter & in Mar 2018, he underwent CV  by Dr Wyline Copas [facility]) . Patient has been on Eliquis & Flecainide since.  Patient has CKD3a (GFR 58) attributed to his HTCVD.  Patient denies any cardiac symptoms as chest pain, palpitations, shortness of breath, dizziness or ankle swelling.     Patient's hyperlipidemia is controlled with diet and Simvastatin . Patient denies myalgias or other medication SE's. Last lipids were at goal:  Lab Results  Component Value Date   CHOL 168 04/13/2018   HDL 83 04/13/2018   LDLCALC 71 04/13/2018   TRIG 60 04/13/2018   CHOLHDL 2.0 04/13/2018       Patient is followed expectantly for glucose intolerance. Patient denies reactive hypoglycemic symptoms, visual blurring, diabetic polys or paresthesias. Last A1c was Normal & at goal:  Lab Results  Component Value Date   HGBA1C 4.9 04/13/2018        Finally, patient has history of Vitamin D Deficiency ("24" / 2008) and last vitamin D was at goal:  Lab Results  Component Value Date   VD25OH 44 04/13/2018    Current Outpatient Medications on File Prior to Visit  Medication Sig  . ALPRAZolam (XANAX) 1 MG tablet Take 1/2 to 1 tablet at Bedtime ONLY if needed for Sleep  . apixaban (ELIQUIS) 5 MG TABS tablet Take 5 mg by mouth 2 (two) times a day.  Marland Kitchen atenolol (TENORMIN) 50 MG tablet Take 1  tablet Daily for BP & Heart  . Cholecalciferol (VITAMIN D3) 5000 UNITS CAPS Take 5,000 Int'l Units by mouth daily.  . diphenhydrAMINE (BENADRYL) 25 mg capsule Take 25 mg by mouth at bedtime as needed for sleep.  . flecainide (TAMBOCOR) 100 MG tablet Take 100 mg by mouth 2 (two) times daily.  . Glucosamine-Chondroit-Vit C-Mn (GLUCOSAMINE 1500 COMPLEX PO) Take by mouth daily.  . hydrochlorothiazide (HYDRODIURIL) 25 MG tablet Take 1 tablet Daily for BP & Fluid  . losartan (COZAAR) 100 MG tablet Take 1 tablet Daily for BP  . simvastatin (ZOCOR) 40 MG tablet TAKE 1 TABLET BY MOUTH EVERY DAY FOR CHOLESTEROL  . valACYclovir (VALTREX) 500 MG tablet TAKE 1 TABLET EVERY DAY  FOR  FEVER  BLISTERS AS DIRECTED   No current facility-administered medications on file prior to visit.   Allergies  Allergen Reactions  . Ultram [Tramadol] Anaphylaxis   Past Medical History:  Diagnosis Date  . Anxiety   . Hyperlipidemia   . Hypertension   . Other testicular hypofunction   . Vitamin D deficiency    Health Maintenance  Topic Date Due  . COLONOSCOPY  03/17/2022  . INFLUENZA VACCINE  Completed  . Hepatitis C Screening  Completed  . PNA vac Low Risk Adult  Completed  . TETANUS/TDAP  Discontinued   Immunization History  Administered Date(s) Administered  . Influenza, High Dose Seasonal PF  01/31/2015, 12/03/2017  . Influenza-Unspecified 12/19/2015, 12/24/2016  . MMR 02/23/2005  . PFIZER SARS-COV-2 Vaccination 03/19/2019, 04/11/2019  . Pneumococcal Conjugate-13 01/31/2015  . Pneumococcal Polysaccharide-23 02/23/2010, 03/30/2016  . Tdap 02/23/2005   Last Colon - 01.23.2014 - Dr Deatra Ina - Normal - Recc 10 yr f/u due Jan /Feb 2024  Past Surgical History:  Procedure Laterality Date  . COLONOSCOPY    . LYMPHADENECTOMY Bilateral 06/23/2012   Procedure: LYMPHADENECTOMY;  Surgeon: Dutch Gray, MD;  Location: [facility];  Service: Urology;  Laterality: Bilateral;  . ROBOT ASSISTED LAPAROSCOPIC RADICAL  PROSTATECTOMY N/A 06/23/2012   Procedure: ROBOTIC ASSISTED LAPAROSCOPIC RADICAL PROSTATECTOMY LEVEL 2;  Surgeon: Dutch Gray, MD;  Location: [facility];  Service: Urology;  Laterality: N/A;   Family History  Problem Relation Age of Onset  . Colon cancer Father   . Esophageal cancer Neg Hx   . Rectal cancer Neg Hx   . Stomach cancer Neg Hx    Social History   Socioeconomic History  . Marital status: Married    Spouse name: Not on file  . Number of children: Not on file  . Years of education: Not on file  . Highest education level: Not on file  Occupational History  . Not on file  Tobacco Use  . Smoking status: Never Smoker  . Smokeless tobacco: Never Used  Substance and Sexual Activity  . Alcohol use: Yes    Alcohol/week: 5.0 standard drinks    Types: 5 Cans of beer per week    Comment: occ  . Drug use: No  . Sexual activity: Not on file     ROS Constitutional: Denies fever, chills, weight loss/gain, headaches, insomnia,  night sweats or change in appetite. Does c/o fatigue. Eyes: Denies redness, blurred vision, diplopia, discharge, itchy or watery eyes.  ENT: Denies discharge, congestion, post nasal drip, epistaxis, sore throat, earache, hearing loss, dental pain, Tinnitus, Vertigo, Sinus pain or snoring.  Cardio: Denies chest pain, palpitations, irregular heartbeat, syncope, dyspnea, diaphoresis, orthopnea, PND, claudication or edema Respiratory: denies cough, dyspnea, DOE, pleurisy, hoarseness, laryngitis or wheezing.  Gastrointestinal: Denies dysphagia, heartburn, reflux, water brash, pain, cramps, nausea, vomiting, bloating, diarrhea, constipation, hematemesis, melena, hematochezia, jaundice or hemorrhoids Genitourinary: Denies dysuria, frequency, urgency, nocturia, hesitancy, discharge, hematuria or flank pain Musculoskeletal: Denies arthralgia, myalgia, stiffness, Jt. Swelling, pain, limp or strain/sprain. Denies Falls. Skin: Denies puritis, rash, hives, warts, acne, eczema or  change in skin lesion Neuro: No weakness, tremor, incoordination, spasms, paresthesia or pain Psychiatric: Denies confusion, memory loss or sensory loss. Denies Depression. Endocrine: Denies change in weight, skin, hair change, nocturia, and paresthesia, diabetic polys, visual blurring or hyper / hypo glycemic episodes.  Heme/Lymph: No excessive bleeding, bruising or enlarged lymph nodes.  Physical Exam  BP 124/60   Pulse 60   Temp (!) 97 F (36.1 C)   Resp 16   Ht 6' 6.25" (1.988 m)   Wt 235 lb 3.2 oz (106.7 kg)   BMI 27.01 kg/m   General Appearance: Well nourished and well groomed and in no apparent distress.  Eyes: PERRLA, EOMs, conjunctiva no swelling or erythema, normal fundi and vessels. Sinuses: No frontal/maxillary tenderness ENT/Mouth: EACs patent / TMs  nl. Nares clear without erythema, swelling, mucoid exudates. Oral hygiene is good. No erythema, swelling, or exudate. Tongue normal, non-obstructing. Tonsils not swollen or erythematous. Hearing normal.  Neck: Supple, thyroid not palpable. No bruits, nodes or JVD. Respiratory: Respiratory effort normal.  BS equal and clear bilateral without rales, rhonci, wheezing or stridor.  Cardio: Heart sounds are normal with regular rate and rhythm and no murmurs, rubs or gallops. Peripheral pulses are normal and equal bilaterally without edema. No aortic or femoral bruits. Chest: symmetric with normal excursions and percussion.  Abdomen: Soft, with Nl bowel sounds. Nontender, no guarding, rebound, hernias, masses, or organomegaly.  Lymphatics: Non tender without lymphadenopathy.  Musculoskeletal: Full ROM all peripheral extremities, joint stability, 5/5 strength, and normal gait. Skin: Warm and dry without rashes, lesions, cyanosis, clubbing or  ecchymosis.  Neuro: Cranial nerves intact, reflexes equal bilaterally. Normal muscle tone, no cerebellar symptoms. Sensation intact.  Pysch: Alert and oriented X 3 with normal affect, insight and  judgment appropriate.   Assessment and Plan  1. Essential hypertension  - EKG 12-Lead - [place], RETROPERITNL ABD,  LTD - Urinalysis, Routine w reflex microscopic - Microalbumin / creatinine urine ratio - CBC with Differential/Platelet - COMPLETE METABOLIC PANEL WITH GFR - Magnesium - TSH  2. Hyperlipidemia, mixed  - EKG 12-Lead - [place], RETROPERITNL ABD,  LTD - Lipid panel - TSH  3. Abnormal glucose  - EKG 12-Lead - [place], RETROPERITNL ABD,  LTD - Hemoglobin A1c - Insulin, random  4. Vitamin D deficiency  - VITAMIN D 25 Hydroxy  5. Paroxysmal atrial fibrillation (HCC)  - EKG 12-Lead  6. History of prostate cancer  - PSA  7. Stage 3a chronic kidney disease - Urinalysis, Routine w reflex microscopic - Microalbumin / creatinine urine ratio - COMPLETE METABOLIC PANEL WITH GFR  8. Prostate cancer screening  - PSA  9. Screening for colorectal cancer  - POC Hemoccult Bld/Stl  10. Screening for ischemic heart disease  - EKG 12-Lead  11. Screening for AAA (aortic abdominal aneurysm)  - [place], RETROPERITNL ABD,  LTD  12. Medication management  - Urinalysis, Routine w reflex microscopic - Microalbumin / creatinine urine ratio - CBC with Differential/Platelet - COMPLETE METABOLIC PANEL WITH GFR - Magnesium - Lipid panel - TSH - Hemoglobin A1c - Insulin, random - VITAMIN D 25 Hydroxy        Patient was counseled in prudent diet, weight control to achieve/maintain BMI less than 25, BP monitoring, regular exercise and medications as discussed.  Discussed med effects and SE's. Routine screening labs and tests as requested with regular follow-up as recommended. Over 40 minutes of exam, counseling, chart review and high complex critical decision making was performed   Kirtland Bouchard, MD

## 2019-05-11 ENCOUNTER — Other Ambulatory Visit: Payer: Self-pay

## 2019-05-11 ENCOUNTER — Ambulatory Visit: Payer: Medicare Other | Admitting: Internal Medicine

## 2019-05-11 VITALS — BP 124/60 | HR 60 | Temp 97.0°F | Resp 16 | Ht 78.25 in | Wt 235.2 lb

## 2019-05-11 DIAGNOSIS — Z136 Encounter for screening for cardiovascular disorders: Secondary | ICD-10-CM

## 2019-05-11 DIAGNOSIS — Z79899 Other long term (current) drug therapy: Secondary | ICD-10-CM

## 2019-05-11 DIAGNOSIS — Z1211 Encounter for screening for malignant neoplasm of colon: Secondary | ICD-10-CM

## 2019-05-11 DIAGNOSIS — Z125 Encounter for screening for malignant neoplasm of prostate: Secondary | ICD-10-CM

## 2019-05-11 DIAGNOSIS — Z1212 Encounter for screening for malignant neoplasm of rectum: Secondary | ICD-10-CM

## 2019-05-11 DIAGNOSIS — I480 Paroxysmal atrial fibrillation: Secondary | ICD-10-CM

## 2019-05-11 DIAGNOSIS — N1831 Chronic kidney disease, stage 3a: Secondary | ICD-10-CM

## 2019-05-11 DIAGNOSIS — I10 Essential (primary) hypertension: Secondary | ICD-10-CM | POA: Diagnosis not present

## 2019-05-11 DIAGNOSIS — Z8546 Personal history of malignant neoplasm of prostate: Secondary | ICD-10-CM | POA: Diagnosis not present

## 2019-05-11 DIAGNOSIS — E559 Vitamin D deficiency, unspecified: Secondary | ICD-10-CM | POA: Diagnosis not present

## 2019-05-11 DIAGNOSIS — E782 Mixed hyperlipidemia: Secondary | ICD-10-CM

## 2019-05-11 DIAGNOSIS — R7309 Other abnormal glucose: Secondary | ICD-10-CM

## 2019-05-12 LAB — COMPLETE METABOLIC PANEL WITH GFR
AG Ratio: 1.9 (calc) (ref 1.0–2.5)
ALT: 18 U/L (ref 9–46)
AST: 24 U/L (ref 10–35)
Albumin: 4.3 g/dL (ref 3.6–5.1)
Alkaline phosphatase (APISO): 57 U/L (ref 35–144)
BUN/Creatinine Ratio: 15 (calc) (ref 6–22)
BUN: 21 mg/dL (ref 7–25)
CO2: 29 mmol/L (ref 20–32)
Calcium: 8.9 mg/dL (ref 8.6–10.3)
Chloride: 103 mmol/L (ref 98–110)
Creat: 1.37 mg/dL — ABNORMAL HIGH (ref 0.70–1.18)
GFR, Est African American: 58 mL/min/{1.73_m2} — ABNORMAL LOW (ref >=60)
GFR, Est Non African American: 50 mL/min/{1.73_m2} — ABNORMAL LOW (ref >=60)
Globulin: 2.3 g/dL (calc) (ref 1.9–3.7)
Glucose, Bld: 103 mg/dL — ABNORMAL HIGH (ref 65–99)
Potassium: 4.3 mmol/L (ref 3.5–5.3)
Sodium: 140 mmol/L (ref 135–146)
Total Bilirubin: 3.1 mg/dL — ABNORMAL HIGH (ref 0.2–1.2)
Total Protein: 6.6 g/dL (ref 6.1–8.1)

## 2019-05-12 LAB — CBC WITH DIFFERENTIAL/PLATELET
Absolute Monocytes: 474 cells/uL (ref 200–950)
Basophils Absolute: 52 cells/uL (ref 0–200)
Basophils Relative: 1.4 %
Eosinophils Absolute: 222 cells/uL (ref 15–500)
Eosinophils Relative: 6 %
HCT: 39.3 % (ref 38.5–50.0)
Hemoglobin: 13.5 g/dL (ref 13.2–17.1)
Lymphs Abs: 1036 cells/uL (ref 850–3900)
MCH: 34.4 pg — ABNORMAL HIGH (ref 27.0–33.0)
MCHC: 34.4 g/dL (ref 32.0–36.0)
MCV: 100 fL (ref 80.0–100.0)
MPV: 9.6 fL (ref 7.5–12.5)
Monocytes Relative: 12.8 %
Neutro Abs: 1917 cells/uL (ref 1500–7800)
Neutrophils Relative %: 51.8 %
Platelets: 169 10*3/uL (ref 140–400)
RBC: 3.93 10*6/uL — ABNORMAL LOW (ref 4.20–5.80)
RDW: 12.2 % (ref 11.0–15.0)
Total Lymphocyte: 28 %
WBC: 3.7 10*3/uL — ABNORMAL LOW (ref 3.8–10.8)

## 2019-05-12 LAB — MAGNESIUM: Magnesium: 2 mg/dL (ref 1.5–2.5)

## 2019-05-12 LAB — LIPID PANEL
Cholesterol: 167 mg/dL (ref <200)
HDL: 83 mg/dL (ref >=40)
LDL Cholesterol (Calc): 71 mg/dL (calc)
Non-HDL Cholesterol (Calc): 84 mg/dL (calc) (ref <130)
Total CHOL/HDL Ratio: 2 (calc) (ref <5.0)
Triglycerides: 51 mg/dL (ref <150)

## 2019-05-12 LAB — URINALYSIS, ROUTINE W REFLEX MICROSCOPIC
Bilirubin Urine: NEGATIVE
Glucose, UA: NEGATIVE
Hgb urine dipstick: NEGATIVE
Ketones, ur: NEGATIVE
Leukocytes,Ua: NEGATIVE
Nitrite: NEGATIVE
Protein, ur: NEGATIVE
Specific Gravity, Urine: 1.012 (ref 1.001–1.03)
pH: 6.5 (ref 5.0–8.0)

## 2019-05-12 LAB — MICROALBUMIN / CREATININE URINE RATIO
Creatinine, Urine: 72 mg/dL (ref 20–320)
Microalb Creat Ratio: 21 mcg/mg creat (ref <30)
Microalb, Ur: 1.5 mg/dL

## 2019-05-12 LAB — TSH: TSH: 2.32 mIU/L (ref 0.40–4.50)

## 2019-05-12 LAB — HEMOGLOBIN A1C
Hgb A1c MFr Bld: 4.8 % of total Hgb (ref <5.7)
Mean Plasma Glucose: 91 (calc)
eAG (mmol/L): 5 (calc)

## 2019-05-12 LAB — PSA: PSA: <0.1 ng/mL (ref <=4.0)

## 2019-05-12 LAB — INSULIN, RANDOM: Insulin: 5.6 u[IU]/mL

## 2019-05-12 LAB — VITAMIN D 25 HYDROXY (VIT D DEFICIENCY, FRACTURES): Vit D, 25-Hydroxy: 60 ng/mL (ref 30–100)

## 2019-06-03 DIAGNOSIS — R5383 Other fatigue: Secondary | ICD-10-CM | POA: Diagnosis not present

## 2019-06-03 DIAGNOSIS — R0902 Hypoxemia: Secondary | ICD-10-CM | POA: Diagnosis not present

## 2019-06-03 DIAGNOSIS — Z20822 Contact with and (suspected) exposure to covid-19: Secondary | ICD-10-CM | POA: Diagnosis not present

## 2019-06-03 DIAGNOSIS — N183 Chronic kidney disease, stage 3 unspecified: Secondary | ICD-10-CM | POA: Diagnosis not present

## 2019-06-03 DIAGNOSIS — F419 Anxiety disorder, unspecified: Secondary | ICD-10-CM | POA: Diagnosis not present

## 2019-06-03 DIAGNOSIS — I080 Rheumatic disorders of both mitral and aortic valves: Secondary | ICD-10-CM | POA: Diagnosis not present

## 2019-06-03 DIAGNOSIS — R531 Weakness: Secondary | ICD-10-CM | POA: Diagnosis not present

## 2019-06-03 DIAGNOSIS — J189 Pneumonia, unspecified organism: Secondary | ICD-10-CM | POA: Diagnosis not present

## 2019-06-03 DIAGNOSIS — I4892 Unspecified atrial flutter: Secondary | ICD-10-CM | POA: Diagnosis not present

## 2019-06-03 DIAGNOSIS — I129 Hypertensive chronic kidney disease with stage 1 through stage 4 chronic kidney disease, or unspecified chronic kidney disease: Secondary | ICD-10-CM | POA: Diagnosis not present

## 2019-06-03 DIAGNOSIS — R002 Palpitations: Secondary | ICD-10-CM | POA: Diagnosis not present

## 2019-06-03 DIAGNOSIS — E559 Vitamin D deficiency, unspecified: Secondary | ICD-10-CM | POA: Diagnosis not present

## 2019-06-03 DIAGNOSIS — E785 Hyperlipidemia, unspecified: Secondary | ICD-10-CM | POA: Diagnosis not present

## 2019-06-03 DIAGNOSIS — R509 Fever, unspecified: Secondary | ICD-10-CM | POA: Diagnosis not present

## 2019-06-03 DIAGNOSIS — I480 Paroxysmal atrial fibrillation: Secondary | ICD-10-CM | POA: Diagnosis not present

## 2019-06-03 DIAGNOSIS — R778 Other specified abnormalities of plasma proteins: Secondary | ICD-10-CM | POA: Diagnosis not present

## 2019-06-03 DIAGNOSIS — I2510 Atherosclerotic heart disease of native coronary artery without angina pectoris: Secondary | ICD-10-CM | POA: Diagnosis not present

## 2019-06-04 DIAGNOSIS — I440 Atrioventricular block, first degree: Secondary | ICD-10-CM | POA: Diagnosis not present

## 2019-06-04 DIAGNOSIS — R918 Other nonspecific abnormal finding of lung field: Secondary | ICD-10-CM | POA: Diagnosis not present

## 2019-06-04 DIAGNOSIS — J90 Pleural effusion, not elsewhere classified: Secondary | ICD-10-CM | POA: Diagnosis not present

## 2019-06-04 DIAGNOSIS — I491 Atrial premature depolarization: Secondary | ICD-10-CM | POA: Diagnosis not present

## 2019-06-04 DIAGNOSIS — I771 Stricture of artery: Secondary | ICD-10-CM | POA: Diagnosis not present

## 2019-06-04 DIAGNOSIS — I4510 Unspecified right bundle-branch block: Secondary | ICD-10-CM | POA: Diagnosis not present

## 2019-06-04 DIAGNOSIS — I2510 Atherosclerotic heart disease of native coronary artery without angina pectoris: Secondary | ICD-10-CM | POA: Diagnosis not present

## 2019-06-04 DIAGNOSIS — I77810 Thoracic aortic ectasia: Secondary | ICD-10-CM | POA: Diagnosis not present

## 2019-06-05 ENCOUNTER — Other Ambulatory Visit: Payer: Self-pay | Admitting: Internal Medicine

## 2019-06-05 ENCOUNTER — Telehealth: Payer: Self-pay | Admitting: *Deleted

## 2019-06-05 MED ORDER — LOSARTAN POTASSIUM 25 MG PO TABS
100.00 | ORAL_TABLET | ORAL | Status: DC
Start: 2019-06-05 — End: 2019-06-05

## 2019-06-05 MED ORDER — APIXABAN 2.5 MG PO TABS
5.00 | ORAL_TABLET | ORAL | Status: DC
Start: 2019-06-04 — End: 2019-06-05

## 2019-06-05 MED ORDER — HYDROCHLOROTHIAZIDE 25 MG PO TABS
25.00 | ORAL_TABLET | ORAL | Status: DC
Start: 2019-06-05 — End: 2019-06-05

## 2019-06-05 MED ORDER — FLECAINIDE ACETATE 150 MG PO TABS
150.00 | ORAL_TABLET | ORAL | Status: DC
Start: 2019-06-04 — End: 2019-06-05

## 2019-06-05 NOTE — Telephone Encounter (Signed)
Attempted to return call regarding cholesterol medication, but VM was full.

## 2019-06-06 ENCOUNTER — Telehealth: Payer: Self-pay | Admitting: *Deleted

## 2019-06-06 ENCOUNTER — Other Ambulatory Visit: Payer: Self-pay | Admitting: Internal Medicine

## 2019-06-06 DIAGNOSIS — I2583 Coronary atherosclerosis due to lipid rich plaque: Secondary | ICD-10-CM

## 2019-06-06 DIAGNOSIS — I2510 Atherosclerotic heart disease of native coronary artery without angina pectoris: Secondary | ICD-10-CM

## 2019-06-06 DIAGNOSIS — E782 Mixed hyperlipidemia: Secondary | ICD-10-CM

## 2019-06-06 MED ORDER — EZETIMIBE 10 MG PO TABS: ORAL_TABLET | ORAL | 1 refills | Status: DC

## 2019-06-06 MED ORDER — ATORVASTATIN CALCIUM 40 MG PO TABS: ORAL_TABLET | ORAL | 1 refills | Status: DC

## 2019-06-06 NOTE — Telephone Encounter (Signed)
Spouse called and reported they are visiting their daughter in [place] and patient was diagnosed with pneumonia. CT of the chest also showed a blockage and he ws advised to change cholesterol medications. Per Dr Melford Aase, the patient was advised to stop Zocor and new RX for Atorvastatin 40 mg and Zetia 10 mg sent to [facility] [id number], as requested. The patient is going to stay in [place] to be treated by a cardiologist. Spouse was advised to call for an OV with Dr Melford Aase when they return home.

## 2019-06-08 DIAGNOSIS — I480 Paroxysmal atrial fibrillation: Secondary | ICD-10-CM | POA: Diagnosis not present

## 2019-06-12 NOTE — Progress Notes (Signed)
Hospital follow up  Assessment and Plan: Kenneth Russell was seen today for hospitalization follow-up.  Diagnoses and all orders for this visit:  Pneumonia of right lower lobe due to infectious organism Much improved, completed abx, scheduled follow up CXR -     DG Chest 2 View; Future -     CBC with Differential/Platelet -     COMPLETE METABOLIC PANEL WITH GFR  Tortuous aorta ([place]) Per CT 05/2019 Control blood pressure, cholesterol, glucose, increase exercise.   Thoracic aortic aneurysm without rupture (HCC) Monitor; control BP; consider CTA/MRA in 1 year  Coronary artery disease involving native coronary artery of native heart without angina pectoris LDL goal <50 per cardiology, switched simvastatin to atorvastatin, recheck at June appointment, add zetia if needed at that time for goal  Control blood pressure, cholesterol, glucose, increase exercise.  Denies exertional angina  Paroxysmal atrial fibrillation (HCC) Rate/rhythm controlled today;  Continue atenolol, off of flecainide per cardiology  Keep follow up with [facility] and Dr. Ola Spurr as scheduled Monitor chest tightness episodes to correlate with rhythm on apple watch, keep log and discuss  Mixed hyperlipidemia LDL goal <50 per cardiology, switched simvastatin to atorvastatin, recheck at June appointment, add zetia if needed at that time for goal  -     COMPLETE METABOLIC PANEL WITH GFR   All medications were reviewed with patient and family and fully reconciled. All questions answered fully, and patient and family members were encouraged to call the office with any further questions or concerns. Discussed goal to avoid readmission related to this diagnosis.   There are no discontinued medications.  Over 40 minutes of exam, counseling, chart review, and complex, high/moderate level critical decision making was performed this visit.   Future Appointments  Date Time Provider [place]  08/14/2019  9:00 AM Liane Comber, NP [facility] None  11/29/2019  9:30 AM Unk Pinto, MD [facility] None  05/20/2020 10:00 AM Unk Pinto, MD [facility] None     HPI 74 y.o.male presents for follow up for transition from recent hospitalization or SNIF stay. Admit date to the hospital was 06/23/12, patient was discharged from the hospital on 06/24/12 and our clinical staff contacted the office the day after discharge to set up a follow up appointment. The discharge summary, medications, and diagnostic test results were reviewed before meeting with the patient. The patient was admitted for:   RLL pneumonia  Other dx Mild CAD Tortuous thoracic aorta A. Fib Borderline thoracic aneurysm - 4.2 mm  Summary of hospital visit via Care Everywhere:   [place]  Kenneth Russell is a 74 y.o. male who was seen at the [facility] ED for generalized cough, weakness, and fever with diagnosis of pneumonia. CT incidentally noted coronary artery disease prompting cardiology evaluation. Medical comorbidities significant for recurrent atrial arrhythmias with previous cardioversions, treated hyperlipidemia, hypertension, chronic kidney disease stage 3.  Lives in [place] and was in [place] visiting family. Does have known atrial arrhythmias with previous cardioversions, Continues on flecainide for antiarrhythmic therapy. Has been recommended for atrial ablation which is yet to be scheduled. Did have a coronary angiogram 03/20/2016 which reports normal coronaries. Echocardiogram at the same time revealed normal ejection fraction EF 67%, mild left atrial enlargement, ascending aorta measurement 41 mm, and mild AR/MR.  The day prior to presentation to the ED he reports was working out in his daughter's yard laying down mulch for several hours. Came inside to rest and noted generalized weakness with a cough, chills,  and fever reaching 102. Reported general weakness and fatigue. This prompted emergency evaluation which  notes creatinine of 1.38, WBC 7.4, high sensitivity troponin 21, 20, and BNP of 3925. Did proceed to chest CT which revealed evidence of patchy ground-glass opacities suggestive of pneumonia. Was placed on doxycycline for treatment.  CT incidentally noted a focal ramus stenosis and mild to moderate LAD disease. This prompted Cardiology consultation. From a system perspective was noted to be a very active individual with playing golf, tennis, bikes, and exercises on a regular basis. At times can play doubles tennis reaching 2 hours. Has not had any limitations to these activities. Has not appreciated any lower extremity edema. Denies experiencing any chest pain or heaviness.     LABORATORY REVIEW Hemoglobin 12.3. Platelet 165. WBC 7.7. INR 1.4. Sodium 140. Potassium 4.1. BUN 21. Creatinine 1.38. Glucose 106. High sensitivity troponin 21, 20. BNP 3925.   ECG 06/03/19  Normal sinus rhythm with marked sinus arrhythmia with 1st degree A-V block. Premature atrial complexes. Rightward axis. Non-specific intra-ventricular conduction delay. (Per personal review, has the appearance of wandering atrial pacemaker. No acute ischemic findings appreciated.)  PORTABLE CXR 06/03/19  No comparison. Patchy airspace opacity in the right lung base medially could related to pneumonia/inflammation. No pleural effusions or pneumothorax.  Tortuous thoracic aorta  CT CARDIAC ANGIOGRAM 06/04/2019 1. Negative for acute pulmonary embolism. 2. Borderline aneurysmal dilatation of the ascending aorta up to 4.2 cm. No thoracic aortic atherosclerotic ulcer, intramural hematoma, or dissection. 3. Scattered coronary artery atherosclerosis with generally mild/moderate narrowing except in the ramus intermedius branch where there is severe focal stenosis CAD-RADS category 4  4. Patchy bilateral consolidative and groundglass opacities greatest in the right upper and middle lobes, likely infectious/inflammatory. 5. Small right and  trace left pleural effusions with mild basilar interlobular septal thickening. CORONARY ARTERIES: Origins/course: Conventional origin and course of the coronary arteries. Dominance: Right. Left Main Coronary: Focal punctate calcification with minimal narrowing. Trifurcation with a ramus intermedius branch. Focal severe stenosis of the proximal ramus branch due to calcified plaque (10/113). Left Anterior Descending: Partially calcified plaque in the proximal and mid LAD results in mild narrowing. Moderate narrowing at the origin of the 1st diagonal due to focal calcified plaque. Left Circumflex: Scattered calcified plaque with mild narrowing. The obtuse marginal branch is widely patent. Right Coronary: Scattered partially calcified plaque with mild narrowing. The posterior descending and posterolateral branches are widely patent.  OTHER CARDIAC FINDINGS: No regional wall motion abnormalities. Likely biatrial enlargement, more prominent on the left. No resting first-pass myocardial perfusion defects. No intracardiac mass or thrombus. Normal-appearing pericardium.  ASSESSMENT / PLAN   #1 Mild coronary artery disease incidentally noted on CT cardiac angiogram 06/04/2019 #2 Paroxysmal atrial arrhythmias with prior cardioversions #3 Hypertension, treated #4 Hyperlipidemia, treated #5 Chronic kidney disease stage 3  Kenneth Russell is a pleasant and very healthy and physically active 74 year old gentleman who presented to the emergency department yesterday evening for symptoms of generalized cough, weakness, and fever was noted to have pneumonia. Chest CT incidentally noted coronary artery disease prompting cardiology consultation. This is in the setting of medical comorbidities including recurrent atrial arrhythmias with previous cardioversions, had been recommended ablation in the past.   CT notes a trifurcation of the ramus, LAD, and circumflex with ramus vessel being significantly stenotic  with mild to moderate disease in the LAD. This is in a gentleman with a significant exercise program and high exercise tolerance without symptoms. Discussed in detail that this is  most likely not a clinically significant lesion. Note angiogram in 2018 does suggest normal coronaries. Does not mention if ramus was observed and degree of stenosis potentially at that time. Does note that the LAD had 0% disease. Part of the difference may be due to different imaging modalities of CT versus coronary angiogram. Would be reasonable to proceed with stress test in the near for in the future to assess for any symptoms signs of ischemia.  However, if truly has progressed from no evidence of coronary artery disease to evidence of mild coronary artery disease in 3 years, aggressive treatment of risk factors would be recommended. Would be reasonable to modify statin therapy for goal LDL less than 70 and could also consider even more aggressive treatment with LDL less than 50.   Concern the use of flecainide in the setting of coronary artery disease. If there is concerns of ischemia, flecainide is usually not recommended. Possible that coronary artery disease incidentally noted is a structural disease and not functional disease and has no anginal symptoms. Was to schedule an ablation in the future but there is no urgency behind having atrial ablation. Would be reasonable to proceed with scheduling this ablation to avoid the use of flecainide. Most likely would need to continue anticoagulation indefinitely even after ablation given is asymptomatic when in atrial fibrillation or flutter.   Recommendations for home cardiology team: -- Consider stress test to ensure no evidence of ischemia. -- Consider modifying statin therapy to atorvastatin or rosuvastatin with high-intensity statin dosing to decrease LDL to below 70.  -- Proceed with atrial flutter/fibrillation ablation with consideration of discontinuing flecainide in the  setting of mild coronary artery disease without ischemic symptoms      Follow up 06/14/2019 BP 104/68   Pulse 68   Temp 98.1 F (36.7 C)   Wt 232 lb (105.2 kg)   SpO2 98%   BMI 26.64 kg/m    Today presents accompanied by wife. Reports 06/03/2019 while visiting daughter in [place] had sudden fever, mild cough, noted 81% pulse ox at home. At ED had negative covid 19 test (had been vaccinated). Apparently troponins were checked and was 21 which prompted CT chest which demonstrated diagonal branch stenosis.   RLL pneumonia as per above, blood cultures were negative, discharged on doxycycline and cefdinir 300 mg BID x 7 days which he has completed - reports has experienced some exertional dyspnea, had episode   He was referred to [facility] cardiology Dr. Jenne Pane and was evaluated on 06/08/2019, was recommended stopping flecainide due to concern with diagonal branch stenosis. Flecainide was stopped and atenolol was increased to 37.5 mg/day (with plan to increase to 50 mg if needed and pulse tolerating). He was recommedned to keep a symptom diary, monitor BP, pulse and monitor rhythm via apple watch. Plan for rhythm control strategy if good correlation vs consider catheter ablation. Plans to follow up in 2-3 weeks, also with local cardiologist Dr. Ola Spurr on May 5th.    He is on cholesterol medication and denies myalgias. His cholesterol is not at goal. [facility] cardiology recommended LDL goal of <50. The cholesterol last visit was:   Lab Results  Component Value Date   CHOL 167 05/11/2019   HDL 83 05/11/2019   LDLCALC 71 05/11/2019   TRIG 51 05/11/2019   CHOLHDL 2.0 05/11/2019      Home health is not involved.      Current Outpatient Medications (Cardiovascular):  .  atenolol (TENORMIN) 50 MG tablet, Take 1  tablet Daily for BP & Heart .  atorvastatin (LIPITOR) 40 MG tablet, Take 1 tablet Daily for Cholesterol .  ezetimibe (ZETIA) 10 MG tablet, Take 1 tablet Daily for Cholesterol .   hydrochlorothiazide (HYDRODIURIL) 25 MG tablet, Take 1 tablet Daily for BP & Fluid .  losartan (COZAAR) 100 MG tablet, Take 1 tablet Daily for BP .  flecainide (TAMBOCOR) 100 MG tablet, Take 100 mg by mouth 2 (two) times daily.  Current Outpatient Medications (Respiratory):  .  diphenhydrAMINE (BENADRYL) 25 mg capsule, Take 25 mg by mouth at bedtime as needed for sleep.   Current Outpatient Medications (Hematological):  .  apixaban (ELIQUIS) 5 MG TABS tablet, Take 5 mg by mouth 2 (two) times a day.  Current Outpatient Medications (Other):  Marland Kitchen  ALPRAZolam (XANAX) 1 MG tablet, Take 1/2 to 1 tablet at Bedtime ONLY if needed for Sleep .  Cholecalciferol (VITAMIN D3) 5000 UNITS CAPS, Take 5,000 Int'l Units by mouth daily. .  Collagen-Boron-Hyaluronic Acid (MOVE FREE ULTRA JOINT HEALTH PO), Take by mouth. .  valACYclovir (VALTREX) 500 MG tablet, TAKE 1 TABLET EVERY DAY  FOR  FEVER  BLISTERS AS DIRECTED .  Glucosamine-Chondroit-Vit C-Mn (GLUCOSAMINE 1500 COMPLEX PO), Take by mouth daily.  Past Medical History:  Diagnosis Date  . Anxiety   . Hyperlipidemia   . Hypertension   . Other testicular hypofunction   . Vitamin D deficiency      Allergies  Allergen Reactions  . Ultram [Tramadol] Anaphylaxis    ROS: all negative except above.   Physical Exam: Filed Weights   06/14/19 0858  Weight: 232 lb (105.2 kg)   BP 104/68   Pulse 68   Temp 98.1 F (36.7 C)   Wt 232 lb (105.2 kg)   SpO2 98%   BMI 26.64 kg/m  General Appearance: Well nourished, in no apparent distress. Eyes: PERRLA, EOMs, conjunctiva no swelling or erythema Sinuses: No Frontal/maxillary tenderness ENT/Mouth: Ext aud canals clear, TMs without erythema, bulging. No erythema, swelling, or exudate on post pharynx.  Tonsils not swollen or erythematous. Hearing normal.  Neck: Supple, thyroid normal.  Respiratory: Respiratory effort normal, BS without rales, rhonchi, wheezing or stridor. Mildly diminished lung sounds R  lower lung base.  Cardio: RRR with no MRGs. Brisk peripheral pulses without edema.  Abdomen: Soft, + BS.  Non tender, no guarding, rebound, hernias, masses. Lymphatics: Non tender without lymphadenopathy.  Musculoskeletal: Full ROM, 5/5 strength, normal gait.  Skin: Warm, dry without rashes, lesions, ecchymosis.  Neuro: Cranial nerves intact. Normal muscle tone, no cerebellar symptoms. Sensation intact.  Psych: Awake and oriented X 3, normal affect, Insight and Judgment appropriate.     Izora Ribas, NP 9:40 AM [facility]

## 2019-06-13 DIAGNOSIS — I771 Stricture of artery: Secondary | ICD-10-CM | POA: Insufficient documentation

## 2019-06-13 DIAGNOSIS — I712 Thoracic aortic aneurysm, without rupture, unspecified: Secondary | ICD-10-CM | POA: Insufficient documentation

## 2019-06-13 DIAGNOSIS — I2510 Atherosclerotic heart disease of native coronary artery without angina pectoris: Secondary | ICD-10-CM | POA: Insufficient documentation

## 2019-06-13 DIAGNOSIS — J189 Pneumonia, unspecified organism: Secondary | ICD-10-CM | POA: Insufficient documentation

## 2019-06-14 ENCOUNTER — Encounter: Payer: Self-pay | Admitting: Adult Health

## 2019-06-14 ENCOUNTER — Other Ambulatory Visit: Payer: Self-pay

## 2019-06-14 ENCOUNTER — Ambulatory Visit (INDEPENDENT_AMBULATORY_CARE_PROVIDER_SITE_OTHER): Payer: Medicare Other | Admitting: Adult Health

## 2019-06-14 VITALS — BP 104/68 | HR 68 | Temp 98.1°F | Wt 232.0 lb

## 2019-06-14 DIAGNOSIS — I712 Thoracic aortic aneurysm, without rupture, unspecified: Secondary | ICD-10-CM

## 2019-06-14 DIAGNOSIS — I771 Stricture of artery: Secondary | ICD-10-CM | POA: Diagnosis not present

## 2019-06-14 DIAGNOSIS — E782 Mixed hyperlipidemia: Secondary | ICD-10-CM | POA: Diagnosis not present

## 2019-06-14 DIAGNOSIS — I480 Paroxysmal atrial fibrillation: Secondary | ICD-10-CM

## 2019-06-14 DIAGNOSIS — I2510 Atherosclerotic heart disease of native coronary artery without angina pectoris: Secondary | ICD-10-CM | POA: Diagnosis not present

## 2019-06-14 DIAGNOSIS — J189 Pneumonia, unspecified organism: Secondary | ICD-10-CM | POA: Diagnosis not present

## 2019-06-14 LAB — CBC WITH DIFFERENTIAL/PLATELET
Absolute Monocytes: 598 cells/uL (ref 200–950)
Basophils Absolute: 78 cells/uL (ref 0–200)
Basophils Relative: 1.7 %
Eosinophils Absolute: 161 cells/uL (ref 15–500)
Eosinophils Relative: 3.5 %
HCT: 44.4 % (ref 38.5–50.0)
Hemoglobin: 15.4 g/dL (ref 13.2–17.1)
Lymphs Abs: 1302 cells/uL (ref 850–3900)
MCH: 34.8 pg — ABNORMAL HIGH (ref 27.0–33.0)
MCHC: 34.7 g/dL (ref 32.0–36.0)
MCV: 100.5 fL — ABNORMAL HIGH (ref 80.0–100.0)
MPV: 9.4 fL (ref 7.5–12.5)
Monocytes Relative: 13 %
Neutro Abs: 2461 cells/uL (ref 1500–7800)
Neutrophils Relative %: 53.5 %
Platelets: 222 10*3/uL (ref 140–400)
RBC: 4.42 10*6/uL (ref 4.20–5.80)
RDW: 12.2 % (ref 11.0–15.0)
Total Lymphocyte: 28.3 %
WBC: 4.6 10*3/uL (ref 3.8–10.8)

## 2019-06-14 LAB — COMPLETE METABOLIC PANEL WITH GFR
AG Ratio: 1.8 (calc) (ref 1.0–2.5)
ALT: 25 U/L (ref 9–46)
AST: 37 U/L — ABNORMAL HIGH (ref 10–35)
Albumin: 4.4 g/dL (ref 3.6–5.1)
Alkaline phosphatase (APISO): 61 U/L (ref 35–144)
BUN/Creatinine Ratio: 20 (calc) (ref 6–22)
BUN: 31 mg/dL — ABNORMAL HIGH (ref 7–25)
CO2: 28 mmol/L (ref 20–32)
Calcium: 9.5 mg/dL (ref 8.6–10.3)
Chloride: 104 mmol/L (ref 98–110)
Creat: 1.54 mg/dL — ABNORMAL HIGH (ref 0.70–1.18)
GFR, Est African American: 51 mL/min/{1.73_m2} — ABNORMAL LOW (ref >=60)
GFR, Est Non African American: 44 mL/min/{1.73_m2} — ABNORMAL LOW (ref >=60)
Globulin: 2.5 g/dL (calc) (ref 1.9–3.7)
Glucose, Bld: 106 mg/dL — ABNORMAL HIGH (ref 65–99)
Potassium: 4.3 mmol/L (ref 3.5–5.3)
Sodium: 140 mmol/L (ref 135–146)
Total Bilirubin: 1.8 mg/dL — ABNORMAL HIGH (ref 0.2–1.2)
Total Protein: 6.9 g/dL (ref 6.1–8.1)

## 2019-06-14 NOTE — Patient Instructions (Addendum)
Please check your apple watch for rhythm, pulse, oxygen level and check blood pressure when you have any shortness of breath or chest tightness - if can correlate with a. Fib or other arrhythmia    Can try doing just atorvastatin for now, recheck cholesterol in June, then add zetia/ezetimibe if still above goal of LDL <50  Please contact office sooner if having increasing chest tightness or shortness of breath episodes  Chest xray at [address] [facility] around 5/10-5/28   Atorvastatin tablets What is this medicine? ATORVASTATIN (a TORE va sta tin) is known as a HMG-CoA reductase inhibitor or 'statin'. It lowers the level of cholesterol and triglycerides in the blood. This drug may also reduce the risk of heart attack, stroke, or other health problems in patients with risk factors for heart disease. Diet and lifestyle changes are often used with this drug. This medicine may be used for other purposes; ask your health care provider or pharmacist if you have questions. COMMON BRAND NAME(S): Lipitor What should I tell my health care provider before I take this medicine? They need to know if you have any of these conditions:  diabetes  if you often drink alcohol  history of stroke  kidney disease  liver disease  muscle aches or weakness  thyroid disease  an unusual or allergic reaction to atorvastatin, other medicines, foods, dyes, or preservatives  pregnant or trying to get pregnant  breast-feeding How should I use this medicine? Take this medicine by mouth with a glass of water. Follow the directions on the prescription label. You can take it with or without food. If it upsets your stomach, take it with food. Do not take with grapefruit juice. Take your medicine at regular intervals. Do not take it more often than directed. Do not stop taking except on your doctor's advice. Talk to your pediatrician regarding the use of this medicine in children. While this drug  may be prescribed for children as young as 10 for selected conditions, precautions do apply. Overdosage: If you think you have taken too much of this medicine contact a poison control center or emergency room at once. NOTE: This medicine is only for you. Do not share this medicine with others. What if I miss a dose? If you miss a dose, take it as soon as you can. If your next dose is to be taken in less than 12 hours, then do not take the missed dose. Take the next dose at your regular time. Do not take double or extra doses. What may interact with this medicine? Do not take this medicine with any of the following medications:  dasabuvir; ombitasvir; paritaprevir; ritonavir  ombitasvir; paritaprevir; ritonavir  posaconazole  red yeast rice This medicine may also interact with the following medications:  alcohol  birth control pills  certain antibiotics like erythromycin and clarithromycin  certain antivirals for HIV or hepatitis  certain medicines for cholesterol like fenofibrate, gemfibrozil, and niacin  certain medicines for fungal infections like ketoconazole and itraconazole  colchicine  cyclosporine  digoxin  grapefruit juice  rifampin This list may not describe all possible interactions. Give your health care provider a list of all the medicines, herbs, non-prescription drugs, or dietary supplements you use. Also tell them if you smoke, drink alcohol, or use illegal drugs. Some items may interact with your medicine. What should I watch for while using this medicine? Visit your doctor or health care professional for regular check-ups. You may need regular tests to  make sure your liver is working properly. Your health care professional may tell you to stop taking this medicine if you develop muscle problems. If your muscle problems do not go away after stopping this medicine, contact your health care professional. Do not become pregnant while taking this medicine. Women  should inform their health care professional if they wish to become pregnant or think they might be pregnant. There is a potential for serious side effects to an unborn child. Talk to your health care professional or pharmacist for more information. Do not breast-feed an infant while taking this medicine. This medicine may increase blood sugar. Ask your healthcare provider if changes in diet or medicines are needed if you have diabetes. If you are going to need surgery or other procedure, tell your doctor that you are using this medicine. This drug is only part of a total heart-health program. Your doctor or a dietician can suggest a low-cholesterol and low-fat diet to help. Avoid alcohol and smoking, and keep a proper exercise schedule. This medicine may cause a decrease in Co-Enzyme Q-10. You should make sure that you get enough Co-Enzyme Q-10 while you are taking this medicine. Discuss the foods you eat and the vitamins you take with your health care professional. What side effects may I notice from receiving this medicine? Side effects that you should report to your doctor or health care professional as soon as possible:  allergic reactions like skin rash, itching or hives, swelling of the face, lips, or tongue  fever  joint pain  loss of memory  redness, blistering, peeling or loosening of the skin, including inside the mouth  signs and symptoms of high blood sugar such as being more thirsty or hungry or having to urinate more than normal. You may also feel very tired or have blurry vision.  signs and symptoms of liver injury like dark yellow or brown urine; general ill feeling or flu-like symptoms; light-belly pain; unusually weak or tired; yellowing of the eyes or skin  signs and symptoms of muscle injury like dark urine; trouble passing urine or change in the amount of urine; unusually weak or tired; muscle pain or side or back pain Side effects that usually do not require medical  attention (report to your doctor or health care professional if they continue or are bothersome):  diarrhea  nausea  stomach pain  trouble sleeping  upset stomach This list may not describe all possible side effects. Call your doctor for medical advice about side effects. You may report side effects to FDA at 1-800-FDA-1088. Where should I keep my medicine? Keep out of the reach of children. Store between 20 and 25 degrees C (68 and 77 degrees F). Throw away any unused medicine after the expiration date. NOTE: This sheet is a summary. It may not cover all possible information. If you have questions about this medicine, talk to your doctor, pharmacist, or health care provider.  2020 Elsevier/Gold Standard (2017-12-01 11:36:16)

## 2019-06-23 DIAGNOSIS — H40003 Preglaucoma, unspecified, bilateral: Secondary | ICD-10-CM | POA: Diagnosis not present

## 2019-06-28 DIAGNOSIS — I10 Essential (primary) hypertension: Secondary | ICD-10-CM | POA: Diagnosis not present

## 2019-06-28 DIAGNOSIS — E782 Mixed hyperlipidemia: Secondary | ICD-10-CM | POA: Diagnosis not present

## 2019-06-28 DIAGNOSIS — C61 Malignant neoplasm of prostate: Secondary | ICD-10-CM | POA: Diagnosis not present

## 2019-06-28 DIAGNOSIS — I2510 Atherosclerotic heart disease of native coronary artery without angina pectoris: Secondary | ICD-10-CM

## 2019-06-28 DIAGNOSIS — R0789 Other chest pain: Secondary | ICD-10-CM | POA: Diagnosis not present

## 2019-06-28 DIAGNOSIS — I483 Typical atrial flutter: Secondary | ICD-10-CM | POA: Diagnosis not present

## 2019-06-28 DIAGNOSIS — I480 Paroxysmal atrial fibrillation: Secondary | ICD-10-CM | POA: Diagnosis not present

## 2019-06-28 DIAGNOSIS — Z8679 Personal history of other diseases of the circulatory system: Secondary | ICD-10-CM | POA: Diagnosis not present

## 2019-06-28 DIAGNOSIS — E785 Hyperlipidemia, unspecified: Secondary | ICD-10-CM | POA: Diagnosis not present

## 2019-06-28 HISTORY — DX: Atherosclerotic heart disease of native coronary artery without angina pectoris: I25.10

## 2019-06-30 DIAGNOSIS — I4891 Unspecified atrial fibrillation: Secondary | ICD-10-CM | POA: Diagnosis not present

## 2019-07-11 ENCOUNTER — Other Ambulatory Visit: Payer: Self-pay | Admitting: Adult Health

## 2019-07-14 DIAGNOSIS — I10 Essential (primary) hypertension: Secondary | ICD-10-CM | POA: Diagnosis not present

## 2019-07-14 DIAGNOSIS — I480 Paroxysmal atrial fibrillation: Secondary | ICD-10-CM | POA: Diagnosis not present

## 2019-07-14 DIAGNOSIS — R079 Chest pain, unspecified: Secondary | ICD-10-CM | POA: Diagnosis not present

## 2019-07-14 DIAGNOSIS — I2510 Atherosclerotic heart disease of native coronary artery without angina pectoris: Secondary | ICD-10-CM | POA: Diagnosis not present

## 2019-07-14 DIAGNOSIS — E785 Hyperlipidemia, unspecified: Secondary | ICD-10-CM | POA: Diagnosis not present

## 2019-07-16 ENCOUNTER — Other Ambulatory Visit: Payer: Self-pay | Admitting: Physician Assistant

## 2019-07-16 DIAGNOSIS — E782 Mixed hyperlipidemia: Secondary | ICD-10-CM

## 2019-07-19 ENCOUNTER — Other Ambulatory Visit: Payer: Self-pay | Admitting: Internal Medicine

## 2019-07-25 DIAGNOSIS — Z85828 Personal history of other malignant neoplasm of skin: Secondary | ICD-10-CM | POA: Diagnosis not present

## 2019-07-25 DIAGNOSIS — L821 Other seborrheic keratosis: Secondary | ICD-10-CM | POA: Diagnosis not present

## 2019-07-25 DIAGNOSIS — L918 Other hypertrophic disorders of the skin: Secondary | ICD-10-CM | POA: Diagnosis not present

## 2019-08-02 DIAGNOSIS — C61 Malignant neoplasm of prostate: Secondary | ICD-10-CM | POA: Diagnosis not present

## 2019-08-02 DIAGNOSIS — I4819 Other persistent atrial fibrillation: Secondary | ICD-10-CM | POA: Diagnosis not present

## 2019-08-02 DIAGNOSIS — Z7901 Long term (current) use of anticoagulants: Secondary | ICD-10-CM | POA: Diagnosis not present

## 2019-08-02 DIAGNOSIS — E785 Hyperlipidemia, unspecified: Secondary | ICD-10-CM | POA: Diagnosis not present

## 2019-08-02 DIAGNOSIS — I10 Essential (primary) hypertension: Secondary | ICD-10-CM | POA: Diagnosis not present

## 2019-08-02 DIAGNOSIS — I2510 Atherosclerotic heart disease of native coronary artery without angina pectoris: Secondary | ICD-10-CM | POA: Diagnosis not present

## 2019-08-14 ENCOUNTER — Encounter: Payer: Self-pay | Admitting: Adult Health Nurse Practitioner

## 2019-08-14 ENCOUNTER — Ambulatory Visit (INDEPENDENT_AMBULATORY_CARE_PROVIDER_SITE_OTHER): Payer: Medicare Other | Admitting: Adult Health Nurse Practitioner

## 2019-08-14 ENCOUNTER — Other Ambulatory Visit: Payer: Self-pay

## 2019-08-14 VITALS — BP 120/80 | HR 56 | Temp 97.7°F | Wt 231.0 lb

## 2019-08-14 DIAGNOSIS — C61 Malignant neoplasm of prostate: Secondary | ICD-10-CM | POA: Diagnosis not present

## 2019-08-14 DIAGNOSIS — I10 Essential (primary) hypertension: Secondary | ICD-10-CM

## 2019-08-14 DIAGNOSIS — R6889 Other general symptoms and signs: Secondary | ICD-10-CM | POA: Diagnosis not present

## 2019-08-14 DIAGNOSIS — I2583 Coronary atherosclerosis due to lipid rich plaque: Secondary | ICD-10-CM

## 2019-08-14 DIAGNOSIS — Z8619 Personal history of other infectious and parasitic diseases: Secondary | ICD-10-CM

## 2019-08-14 DIAGNOSIS — Z0001 Encounter for general adult medical examination with abnormal findings: Secondary | ICD-10-CM

## 2019-08-14 DIAGNOSIS — I712 Thoracic aortic aneurysm, without rupture, unspecified: Secondary | ICD-10-CM

## 2019-08-14 DIAGNOSIS — I2510 Atherosclerotic heart disease of native coronary artery without angina pectoris: Secondary | ICD-10-CM

## 2019-08-14 DIAGNOSIS — F419 Anxiety disorder, unspecified: Secondary | ICD-10-CM

## 2019-08-14 DIAGNOSIS — Z6826 Body mass index (BMI) 26.0-26.9, adult: Secondary | ICD-10-CM

## 2019-08-14 DIAGNOSIS — I4892 Unspecified atrial flutter: Secondary | ICD-10-CM

## 2019-08-14 DIAGNOSIS — Z79899 Other long term (current) drug therapy: Secondary | ICD-10-CM

## 2019-08-14 DIAGNOSIS — H40003 Preglaucoma, unspecified, bilateral: Secondary | ICD-10-CM

## 2019-08-14 DIAGNOSIS — N1831 Chronic kidney disease, stage 3a: Secondary | ICD-10-CM

## 2019-08-14 DIAGNOSIS — E782 Mixed hyperlipidemia: Secondary | ICD-10-CM

## 2019-08-14 DIAGNOSIS — E559 Vitamin D deficiency, unspecified: Secondary | ICD-10-CM

## 2019-08-14 DIAGNOSIS — R7309 Other abnormal glucose: Secondary | ICD-10-CM

## 2019-08-14 DIAGNOSIS — Z0000 Encounter for general adult medical examination without abnormal findings: Secondary | ICD-10-CM

## 2019-08-14 DIAGNOSIS — I480 Paroxysmal atrial fibrillation: Secondary | ICD-10-CM

## 2019-08-14 NOTE — Patient Instructions (Signed)
    Kenneth Russell , Thank you for taking time to come for your Medicare Wellness Visit. I appreciate your ongoing commitment to your health goals. Please review the following plan we discussed and let me know if I can assist you in the future.   These are the goals we discussed: Goals    . LDL CALC < 50   You are due for Tetanus vaccination, should you get cut by metal please let us know.     This is a list of the screening recommended for you and due dates:  Health Maintenance  Topic Date Due  . Flu Shot  09/24/2019  . Colon Cancer Screening  03/17/2022  . COVID-19 Vaccine  Completed  .  Hepatitis C: One time screening is recommended by Center for Disease Control  (CDC) for  adults born from [birth year] through 1965.   Completed  . Pneumonia vaccines  Completed  . Tetanus Vaccine  Discontinued    We will forward the PSA lab results to Dr [name].   We will contact you with your lab results in 1-3 days via [place].

## 2019-08-14 NOTE — Progress Notes (Signed)
MEDICARE ANNUAL WELLNESS VISIT AND FOLLOW UP Assessment:   Diagnoses and all orders for this visit:  Encounter for Medicare annual wellness exam Yearly  Paroxysmal atrial fibrillation (HCC) Controlled, atenolol '50mg'$  Has stopped flecainide Chadsvasc 2; on elequis Followed by cardiology  Essential hypertension Continue medications: Losartan '100mg'$ , HCTZ '25mg'$  Monitor blood pressure at home; call if consistently over 130/80 Continue DASH diet.   Reminder to go to the ER if any CP, SOB, nausea, dizziness, severe HA, changes vision/speech, left arm numbness and tingling and jaw pain.  Atrial flutter, unspecified type ([place]) Rate controlled by flecainide; followed by cardiology Underwent repeat CV in 06/2018 and doing well   Prostate cancer [facility]) S/p prostatectomy; followed by Dr. [name]  Vitamin D deficiency At goal at recent check; continue to recommend supplementation for goal of 60-100 Defer vitamin D level  Medication management Monitor CBC, CMP, magnesium at routine OVs  Mixed hyperlipidemia Continue medications: Atorvastatin '40mg'$ , zetia '10mg'$  Continue low cholesterol diet and exercise.  Monitor lipid panel.   Glaucoma suspect, bilateral Followed by [name]  BMI 26.0-26.9,adult Continue to recommend diet heavy in fruits and veggies and low in animal meats, cheeses, and dairy products, appropriate calorie intake Discuss exercise recommendations routinely Continue to monitor weight at each visit  Anxiety Uses xanax sporadically PRN; Well managed by current regimen; reminded to avoid daily use of benzo Stress management techniques discussed, increase water, good sleep hygiene discussed, increase exercise, and increase veggies.   Abnormal glucose Recent A1Cs at goal, defer check today Discussed diet/exercise, weight management    CKD (chronic kidney disease) stage 3, GFR 30-59 ml/min (HCC) Increase fluids, avoid NSAIDS, monitor sugars, will monitor  Cold  sores Valtrex PRN flare  Over 30 minutes of face to face exam, counseling, chart review, and critical decision making was performed.  Future Appointments  Date Time Provider [place]  11/29/2019  9:30 AM Unk Pinto, MD [facility] None  05/20/2020 10:00 AM Unk Pinto, MD [facility] None     Plan:   During the course of the visit the patient was educated and counseled about appropriate screening and preventive services including:    Pneumococcal vaccine   Influenza vaccine  Prevnar 13  Td vaccine  Screening electrocardiogram  Colorectal cancer screening  Diabetes screening  Glaucoma screening  Nutrition counseling    Subjective:  Kenneth Russell is a 74 y.o. male who presents for Medicare Annual Wellness Visit and 3 month follow up for HTN, HLDhyperlipidemia, glucose management, and vitamin D Def.    Patient is S/p Radical Prostatectomy in 2014 by Dr. [name], follows annually, no issues. Has an appointment next week, will check PSA today with lab drawn.  He had a Negative Cardiolite in 2007. In Jan 2018, he had a negative heart cath evaluating new onset pAfib/Flutter. In Mar 2018, he had CV by Dr Wyline Copas in [facility] and has been on [place] since. Patient denies any cardiac symptoms. He golfs or exercises daily.  He reports he is now under the care of Dr. Ola Spurr and had repeat cardioversion 3 weeks ago after holter showed repeat a. flutter, feeling well since that time, will follow up in August 2020.   He has recurrent mouth sores, takes valtrex PRN for flares.   BMI is Body mass index is 26.52 kg/m., he has been working on diet and exercise. Very active, exercises at least 60 min daily, cycling, weights, tennis, golf.  Wt Readings from Last 3 Encounters:  08/14/19 231  lb (104.8 kg)  06/14/19 232 lb (105.2 kg)  05/11/19 235 lb 3.2 oz (106.7 kg)   His blood pressure has been controlled at home, today their BP is BP: 120/80 He does  workout. He denies chest pain, shortness of breath, dizziness.   He is on cholesterol medication (simvastatin 40 mg daily) and denies myalgias. His cholesterol is at goal. The cholesterol last visit was:   Lab Results  Component Value Date   CHOL 167 05/11/2019   HDL 83 05/11/2019   LDLCALC 71 05/11/2019   TRIG 51 05/11/2019   CHOLHDL 2.0 05/11/2019   He has been working on diet and exercise for glucose management, and denies increased appetite, nausea, paresthesia of the feet, polydipsia, polyuria and visual disturbances. Last A1C in the office was:  Lab Results  Component Value Date   HGBA1C 4.8 05/11/2019   Last GFR, has CKD3a monitored and last OV: Lab Results  Component Value Date   GFRNONAA 44 (L) 06/14/2019   Patient is on Vitamin D supplement.   Lab Results  Component Value Date   VD25OH 60 05/11/2019      Medication Review:   Current Outpatient Medications (Cardiovascular):  .  atenolol (TENORMIN) 50 MG tablet, Take 1 tablet Daily for BP & Heart .  atorvastatin (LIPITOR) 40 MG tablet, Take 1 tablet Daily for Cholesterol .  ezetimibe (ZETIA) 10 MG tablet, Take 1 tablet Daily for Cholesterol .  hydrochlorothiazide (HYDRODIURIL) 25 MG tablet, TAKE 1 TABLET DAILY FOR BP & FLUID .  losartan (COZAAR) 100 MG tablet, TAKE 1 TABLET BY MOUTH DAILY FOR BLOOD PRESSURE  Current Outpatient Medications (Respiratory):  .  diphenhydrAMINE (BENADRYL) 25 mg capsule, Take 25 mg by mouth at bedtime as needed for sleep.   Current Outpatient Medications (Hematological):  .  apixaban (ELIQUIS) 5 MG TABS tablet, Take 5 mg by mouth 2 (two) times a day.  Current Outpatient Medications (Other):  Marland Kitchen  ALPRAZolam (XANAX) 1 MG tablet, Take 1/2 to 1 tablet at Bedtime ONLY if needed for Sleep .  Cholecalciferol (VITAMIN D3) 5000 UNITS CAPS, Take 5,000 Int'l Units by mouth daily. .  Collagen-Boron-Hyaluronic Acid (MOVE FREE ULTRA JOINT HEALTH PO), Take by mouth. .  valACYclovir (VALTREX) 500 MG  tablet, TAKE 1 TABLET BY MOUTH DAILY FOR FEVER BLISTERS AS DIRECTED  Allergies: Allergies  Allergen Reactions  . Ultram [Tramadol] Anaphylaxis    Current Problems (verified) has Hyperlipidemia, LDL goal <50; Hypertension; Anxiety; Vitamin D deficiency; Prostate cancer ([place]); Medication management; Abnormal glucose; BMI 27.0-27.9,adult; Atrial flutter ([place]); Glaucoma suspect, bilateral; Paroxysmal atrial fibrillation (HCC); CKD (chronic kidney disease) stage 3, GFR 30-59 ml/min; Tortuous aorta ([place]); Thoracic aortic aneurysm without rupture ([place]); Pneumonia of right lower lobe due to infectious organism; and Coronary artery disease involving native coronary artery of native heart without angina pectoris on their problem list.  Screening Tests Immunization History  Administered Date(s) Administered  . Influenza, High Dose Seasonal PF 01/31/2015, 12/03/2017  . Influenza-Unspecified 12/19/2015, 12/24/2016  . MMR 02/23/2005  . PFIZER SARS-COV-2 Vaccination 03/19/2019, 04/11/2019  . Pneumococcal Conjugate-13 01/31/2015  . Pneumococcal Polysaccharide-23 02/23/2010, 03/30/2016  . Tdap 02/23/2005    Preventative care: Last colonoscopy: 2014 due 2024  Prior vaccinations: TD or Tdap: 2007 DUE, discussed with patient, declines today. Influenza: 2019 Pneumococcal: 2018 Prevnar13: 2016 Shingles/Zostavax: Discussed with patient [place]: Complete 04/11/19   Names of Other Physician/Practitioners you currently use: 1. [facility] here for primary care 2. [name], eye doctor, last  visit 2019, monitoring glaucoma, goes annualy 3. Dr. Gloriann Loan, dentist, last visit, last visit 2020  Patient Care Team: Unk Pinto, MD as PCP - General (Internal Medicine) Raynelle Bring, MD as Consulting Physician (Urology) Inda Castle, MD (Inactive) as Consulting Physician (Gastroenterology) Susa Day, MD as Consulting Physician (Orthopedic  Surgery) Jarome Matin, MD as Consulting Physician (Dermatology) Bond, Tracie Harrier, MD as Referring Physician (Ophthalmology) Ebbie Ridge, MD as Referring Physician (Cardiology)  Surgical: He  has a past surgical history that includes Colonoscopy; Robot assisted laparoscopic radical prostatectomy (N/A, 06/23/2012); and Lymphadenectomy (Bilateral, 06/23/2012). Family His family history includes Colon cancer in his father. Social history  He reports that he has never smoked. He has never used smokeless tobacco. He reports current alcohol use of about 5.0 standard drinks of alcohol per week. He reports that he does not use drugs.  MEDICARE WELLNESS OBJECTIVES: Physical activity: Current Exercise Habits: Home exercise routine, Type of exercise: calisthenics;strength training/weights;walking (Golf, tennis), Time (Minutes): 40, Frequency (Times/Week): 7, Weekly Exercise (Minutes/Week): 280, Intensity: Moderate, Exercise limited by: None identified;cardiac condition(s) Cardiac risk factors: Cardiac Risk Factors include: advanced age (>42mn, >>22women);dyslipidemia;male gender;hypertension Depression/mood screen:   Dep[facility] screen [facility] 08/14/2019  Decreased Interest 0  Down, Depressed, Hopeless 0  PHQ - 2 Score 0    ADLs:  In your present state of health, do you have any difficulty performing the following activities: 08/14/2019 05/10/2019  Hearing? N N  Vision? N N  Difficulty concentrating or making decisions? N N  Walking or climbing stairs? N N  Dressing or bathing? N N  Doing errands, shopping? N N  Preparing Food and eating ? N -  Using the Toilet? N -  In the past six months, have you accidently leaked urine? N -  Do you have problems with loss of bowel control? N -  Managing your Medications? N -  Managing your Finances? N -  Housekeeping or managing your Housekeeping? N -  Some recent data might be hidden     Cognitive Testing  Alert? Yes  Normal  Appearance?Yes  Oriented to person? Yes  Place? Yes   Time? Yes  Recall of three objects?  Yes  Can perform simple calculations? Yes  Displays appropriate judgment?Yes  Can read the correct time from a watch face?Yes  EOL planning: Does Patient Have a Medical Advance Directive?: Yes Type of Advance Directive: Healthcare Power of Attorney, Living will Does patient want to make changes to medical advance directive?: No - Patient declined Copy of [place] Chart?: No - copy requested   Objective:   Today's Vitals   08/14/19 0856  BP: 120/80  Pulse: (!) 56  Temp: 97.7 F (36.5 C)  SpO2: 99%  Weight: 231 lb (104.8 kg)   Body mass index is 26.52 kg/m.  General : Well sounding patient in no apparent distress HEENT: no hoarseness, no cough for duration of visit Lungs: speaks in complete sentences, no audible wheezing, no apparent distress Neurological: alert, oriented x 3 Psychiatric: pleasant, judgement appropriate   Medicare Attestation I have personally reviewed: The patient's medical and social history Their use of alcohol, tobacco or illicit drugs Their current medications and supplements The patient's functional ability including ADLs,fall risks, home safety risks, cognitive, and hearing and visual impairment Diet and physical activities Evidence for depression or mood disorders  The patient's weight, height, BMI, and visual acuity have been recorded in the chart.  I have made referrals, counseling, and provided education  to the patient based on review of the above and I have provided the patient with a written personalized care plan for preventive services.     Garnet Sierras, NP   08/14/2019

## 2019-08-15 LAB — CBC WITH DIFFERENTIAL/PLATELET
Absolute Monocytes: 454 cells/uL (ref 200–950)
Basophils Absolute: 80 cells/uL (ref 0–200)
Basophils Relative: 1.9 %
Eosinophils Absolute: 231 cells/uL (ref 15–500)
Eosinophils Relative: 5.5 %
HCT: 41.8 % (ref 38.5–50.0)
Hemoglobin: 14.2 g/dL (ref 13.2–17.1)
Lymphs Abs: 1008 cells/uL (ref 850–3900)
MCH: 34.4 pg — ABNORMAL HIGH (ref 27.0–33.0)
MCHC: 34 g/dL (ref 32.0–36.0)
MCV: 101.2 fL — ABNORMAL HIGH (ref 80.0–100.0)
MPV: 9.7 fL (ref 7.5–12.5)
Monocytes Relative: 10.8 %
Neutro Abs: 2428 cells/uL (ref 1500–7800)
Neutrophils Relative %: 57.8 %
Platelets: 173 10*3/uL (ref 140–400)
RBC: 4.13 10*6/uL — ABNORMAL LOW (ref 4.20–5.80)
RDW: 12.1 % (ref 11.0–15.0)
Total Lymphocyte: 24 %
WBC: 4.2 10*3/uL (ref 3.8–10.8)

## 2019-08-15 LAB — COMPLETE METABOLIC PANEL WITH GFR
AG Ratio: 1.7 (calc) (ref 1.0–2.5)
ALT: 20 U/L (ref 9–46)
AST: 24 U/L (ref 10–35)
Albumin: 4.1 g/dL (ref 3.6–5.1)
Alkaline phosphatase (APISO): 61 U/L (ref 35–144)
BUN/Creatinine Ratio: 17 (calc) (ref 6–22)
BUN: 22 mg/dL (ref 7–25)
CO2: 27 mmol/L (ref 20–32)
Calcium: 8.9 mg/dL (ref 8.6–10.3)
Chloride: 107 mmol/L (ref 98–110)
Creat: 1.33 mg/dL — ABNORMAL HIGH (ref 0.70–1.18)
GFR, Est African American: 61 mL/min/{1.73_m2} (ref >=60)
GFR, Est Non African American: 52 mL/min/{1.73_m2} — ABNORMAL LOW (ref >=60)
Globulin: 2.4 g/dL (calc) (ref 1.9–3.7)
Glucose, Bld: 104 mg/dL — ABNORMAL HIGH (ref 65–99)
Potassium: 4.5 mmol/L (ref 3.5–5.3)
Sodium: 142 mmol/L (ref 135–146)
Total Bilirubin: 2.5 mg/dL — ABNORMAL HIGH (ref 0.2–1.2)
Total Protein: 6.5 g/dL (ref 6.1–8.1)

## 2019-08-15 LAB — LIPID PANEL
Cholesterol: 146 mg/dL (ref <200)
HDL: 76 mg/dL (ref >=40)
LDL Cholesterol (Calc): 56 mg/dL (calc)
Non-HDL Cholesterol (Calc): 70 mg/dL (calc) (ref <130)
Total CHOL/HDL Ratio: 1.9 (calc) (ref <5.0)
Triglycerides: 59 mg/dL (ref <150)

## 2019-08-15 LAB — PSA: PSA: <0.1 ng/mL (ref <=4.0)

## 2019-10-08 ENCOUNTER — Other Ambulatory Visit: Payer: Self-pay | Admitting: Physician Assistant

## 2019-10-16 ENCOUNTER — Other Ambulatory Visit: Payer: Self-pay | Admitting: Internal Medicine

## 2019-10-23 ENCOUNTER — Encounter: Payer: Self-pay | Admitting: Cardiology

## 2019-10-23 NOTE — Progress Notes (Signed)
Verlan Friends MD Reason for referral-atrial fibrillation and coronary artery disease  HPI: 74 year old male for evaluation of atrial fibrillation and coronary artery disease at request of Unk Pinto MD.  Patient previously followed by Dr. Adrian Prows in [facility].  Cardiac catheterization 2018 showed normal coronary arteries.  Echocardiogram 2018 showed normal LV function, mild left atrial enlargement, mild aortic and mitral regurgitation.  CTA April 2021 showed no pulmonary embolus, dilated aortic root at 4.2 cm, nonobstructive coronary disease other than severe stenosis and ramus intermedius. Nuclear study May 2021 showed ejection fraction 51% and no ischemia or infarction.Patient previously treated with flecainide for atrial fibrillation but this was discontinued.  He has therefore been treated with rate control and anticoagulation.  Patient denies dyspnea, chest pain, palpitations or syncope.  No bleeding.  Current Outpatient Medications  Medication Sig Dispense Refill   ALPRAZolam (XANAX) 1 MG tablet Take 1/2 - 1 tablet 2    at Bedtime     ONLY if needed for Sleep  &  limit to 5 days /week to avoid Addiction & Dementia 30 tablet 0   apixaban (ELIQUIS) 5 MG TABS tablet Take 5 mg by mouth 2 (two) times a day.     atenolol (TENORMIN) 25 MG tablet      atorvastatin (LIPITOR) 40 MG tablet Take 1 tablet Daily for Cholesterol 90 tablet 1   Cholecalciferol (VITAMIN D3) 5000 UNITS CAPS Take 5,000 Int'l Units by mouth daily.     diphenhydrAMINE (BENADRYL) 25 mg capsule Take 25 mg by mouth at bedtime as needed for sleep.     ezetimibe (ZETIA) 10 MG tablet Take 1 tablet by mouth daily.     Glucos-Chond-Hyal Ac-Ca Fructo (MOVE FREE JOINT HEALTH ADVANCE) TABS Take 1 tablet by mouth every morning.     glucosamine-chondroitin 500-400 MG tablet Take 1 tablet by mouth daily.     hydrochlorothiazide (HYDRODIURIL) 25 MG tablet TAKE 1 TABLET DAILY FOR BP & FLUID 90 tablet 3    ibuprofen (ADVIL) 200 MG tablet Take by mouth.     losartan (COZAAR) 100 MG tablet TAKE 1 TABLET BY MOUTH DAILY FOR BLOOD PRESSURE 90 tablet 3   simvastatin (ZOCOR) 40 MG tablet Take 1 tablet at Bedtime for Cholesterol 90 tablet 0   valACYclovir (VALTREX) 500 MG tablet TAKE 1 TABLET BY MOUTH DAILY FOR FEVER BLISTERS AS DIRECTED 90 tablet 0   No current facility-administered medications for this visit.    Allergies  Allergen Reactions   Ultram [Tramadol] Anaphylaxis   Atorvastatin     Other reaction(s): Myalgias (intolerance)   Doxazosin Other (See Comments)    Dizziness    Telmisartan Hypertension and Other (See Comments)     Past Medical History:  Diagnosis Date   Anxiety    Coronary artery disease    Hyperlipidemia    Hypertension    ICH (intracerebral hemorrhage) (HCC)    Other testicular hypofunction    Permanent atrial fibrillation (HCC)    Prostate cancer (HCC)    Vitamin D deficiency     Past Surgical History:  Procedure Laterality Date   COLONOSCOPY     LYMPHADENECTOMY Bilateral 06/23/2012   Procedure: LYMPHADENECTOMY;  Surgeon: Dutch Gray, MD;  Location: [facility];  Service: Urology;  Laterality: Bilateral;   ROBOT ASSISTED LAPAROSCOPIC RADICAL PROSTATECTOMY N/A 06/23/2012   Procedure: ROBOTIC ASSISTED LAPAROSCOPIC RADICAL PROSTATECTOMY LEVEL 2;  Surgeon: Dutch Gray, MD;  Location: [facility];  Service: Urology;  Laterality: N/A;    Social History  Socioeconomic History   Marital status: Married    Spouse name: Not on file   Number of children: 3   Years of education: Not on file   Highest education level: Not on file  Occupational History    Comment: Sales  Tobacco Use   Smoking status: Never Smoker   Smokeless tobacco: Never Used  Substance and Sexual Activity   Alcohol use: Yes    Alcohol/week: 5.0 standard drinks    Types: 5 Cans of beer per week    Comment: 2-3 beers per day   Drug use: No   Sexual activity: Not on file   Other Topics Concern   Not on file  Social History Narrative   Not on file   Social Determinants of Health   Financial Resource Strain:    Difficulty of Paying Living Expenses: Not on file  Food Insecurity:    Worried About [place] in the Last Year: Not on file   Ran Out of Food in the Last Year: Not on file  Transportation Needs:    Lack of Transportation (Medical): Not on file   Lack of Transportation (Non-Medical): Not on file  Physical Activity:    Days of Exercise per Week: Not on file   Minutes of Exercise per Session: Not on file  Stress:    Feeling of Stress : Not on file  Social Connections:    Frequency of Communication with Friends and Family: Not on file   Frequency of Social Gatherings with Friends and Family: Not on file   Attends Religious Services: Not on file   Active Member of Clubs or Organizations: Not on file   Attends Archivist Meetings: Not on file   Marital Status: Not on file  Intimate Partner Violence:    Fear of Current or Ex-Partner: Not on file   Emotionally Abused: Not on file   Physically Abused: Not on file   Sexually Abused: Not on file    Family History  Problem Relation Age of Onset   Colon cancer Father    Esophageal cancer Neg Hx    Rectal cancer Neg Hx    Stomach cancer Neg Hx     ROS: no fevers or chills, productive cough, hemoptysis, dysphasia, odynophagia, melena, hematochezia, dysuria, hematuria, rash, seizure activity, orthopnea, PND, pedal edema, claudication. Remaining systems are negative.  Physical Exam:   Blood pressure 118/82, pulse 67, temperature (!) 97.4 F (36.3 C), height 6\' 6"  (1.981 m), weight 229 lb 12.8 oz (104.2 kg), SpO2 99 %.  General:  Well developed/well nourished in NAD Skin warm/dry Patient not depressed No peripheral clubbing Back-normal HEENT-normal/normal eyelids Neck supple/normal carotid upstroke bilaterally; no bruits; no JVD; no  thyromegaly chest - CTA/ normal expansion CV - irregular/normal S1 and S2; no murmurs, rubs or gallops;  PMI nondisplaced Abdomen -NT/ND, no HSM, no mass, + bowel sounds, no bruit 2+ femoral pulses, no bruits Ext-no edema, chords, 2+ DP Neuro-grossly nonfocal  ECG -atrial fibrillation at a rate of 67, no ST changes.  Personally reviewed  A/P  1 permanent atrial fibrillation-patient doing well from a symptomatic standpoint.  Plan therefore is long-term anticoagulation and rate control.  Continue atenolol and apixaban.  2 coronary artery disease-patient denies chest pain.  Recent CTA showed disease in a ramus but otherwise not obstructive disease.  Continue statin.  He is not on aspirin given need for apixaban.  3 hypertension-patient's blood pressure is controlled.  Continue present medications.  4 hyperlipidemia-patient  did not tolerate Lipitor.  However he tolerates simvastatin and Zetia.  Most recent LDL is less than 70.  We will continue with present medical regimen.  5 dilated aortic root-plan follow-up CTA April 2022.  6 aortic insufficiency-mild on previous echocardiogram.  We will repeat study.  Kirk Ruths, MD

## 2019-10-26 ENCOUNTER — Encounter: Payer: Self-pay | Admitting: Cardiology

## 2019-10-26 ENCOUNTER — Ambulatory Visit (INDEPENDENT_AMBULATORY_CARE_PROVIDER_SITE_OTHER): Payer: Medicare Other | Admitting: Cardiology

## 2019-10-26 ENCOUNTER — Other Ambulatory Visit: Payer: Self-pay

## 2019-10-26 VITALS — BP 118/82 | HR 67 | Temp 97.4°F | Ht 78.0 in | Wt 229.8 lb

## 2019-10-26 DIAGNOSIS — I359 Nonrheumatic aortic valve disorder, unspecified: Secondary | ICD-10-CM

## 2019-10-26 DIAGNOSIS — I4821 Permanent atrial fibrillation: Secondary | ICD-10-CM | POA: Diagnosis not present

## 2019-10-26 DIAGNOSIS — I2510 Atherosclerotic heart disease of native coronary artery without angina pectoris: Secondary | ICD-10-CM | POA: Diagnosis not present

## 2019-10-26 DIAGNOSIS — I712 Thoracic aortic aneurysm, without rupture, unspecified: Secondary | ICD-10-CM

## 2019-10-26 MED ORDER — HYDROCHLOROTHIAZIDE 25 MG PO TABS
25.0000 mg | ORAL_TABLET | Freq: Every day | ORAL | 3 refills | Status: DC
Start: 2019-10-26 — End: 2020-12-09

## 2019-10-26 MED ORDER — APIXABAN 5 MG PO TABS
5.0000 mg | ORAL_TABLET | Freq: Two times a day (BID) | ORAL | 3 refills | Status: DC
Start: 2019-10-26 — End: 2020-12-09

## 2019-10-26 MED ORDER — EZETIMIBE 10 MG PO TABS
10.0000 mg | ORAL_TABLET | Freq: Every day | ORAL | 3 refills | Status: DC
Start: 2019-10-26 — End: 2019-12-16

## 2019-10-26 MED ORDER — ATENOLOL 25 MG PO TABS
25.0000 mg | ORAL_TABLET | Freq: Every day | ORAL | 3 refills | Status: DC
Start: 2019-10-26 — End: 2020-12-09

## 2019-10-26 MED ORDER — LOSARTAN POTASSIUM 100 MG PO TABS: ORAL_TABLET | ORAL | 3 refills | Status: DC

## 2019-10-26 NOTE — Patient Instructions (Signed)
Medication Instructions:   NO CHANGE  *If you need a refill on your cardiac medications before your next appointment, please call your pharmacy*   Testing/Procedures:  Your physician has requested that you have an echocardiogram. Echocardiography is a painless test that uses sound waves to create images of your heart. It provides your doctor with information about the size and shape of your heart and how well your hearts chambers and valves are working. This procedure takes approximately one hour. There are no restrictions for this procedure.[place]     Follow-Up: At [facility], you and your health needs are our priority.  As part of our continuing mission to provide you with exceptional heart care, we have created designated Provider Care Teams.  These Care Teams include your primary Cardiologist (physician) and Advanced Practice Providers (APPs -  Physician Assistants and Nurse Practitioners) who all work together to provide you with the care you need, when you need it.  We recommend signing up for the patient portal called "MyChart".  Sign up information is provided on this After Visit Summary.  MyChart is used to connect with patients for Virtual Visits (Telemedicine).  Patients are able to view lab/test results, encounter notes, upcoming appointments, etc.  Non-urgent messages can be sent to your provider as well.   To learn more about what you can do with MyChart, go to [email or web address].    Your next appointment:   6 month(s)  The format for your next appointment:   In Person  Provider:   You may see Kirk Ruths MD or one of the following Advanced Practice Providers on your designated Care Team:    Kerin Ransom, PA-C  [place], [place]  Coletta Memos, [place]

## 2019-10-26 NOTE — Addendum Note (Signed)
Addended by: Cristopher Estimable on: 10/26/2019 10:55 AM   Modules accepted: Orders

## 2019-10-27 DIAGNOSIS — Z23 Encounter for immunization: Secondary | ICD-10-CM | POA: Diagnosis not present

## 2019-11-01 ENCOUNTER — Other Ambulatory Visit: Payer: Self-pay | Admitting: Physician Assistant

## 2019-11-05 DIAGNOSIS — Z20822 Contact with and (suspected) exposure to covid-19: Secondary | ICD-10-CM | POA: Diagnosis not present

## 2019-11-14 ENCOUNTER — Ambulatory Visit (HOSPITAL_COMMUNITY): Payer: Medicare Other | Attending: Cardiology

## 2019-11-14 ENCOUNTER — Other Ambulatory Visit: Payer: Self-pay

## 2019-11-14 DIAGNOSIS — I359 Nonrheumatic aortic valve disorder, unspecified: Secondary | ICD-10-CM | POA: Diagnosis not present

## 2019-11-14 LAB — ECHOCARDIOGRAM COMPLETE
Area-P 1/2: 2.84 cm2
P 1/2 time: 781 msec
S' Lateral: 3.6 cm

## 2019-11-23 DIAGNOSIS — Z85828 Personal history of other malignant neoplasm of skin: Secondary | ICD-10-CM | POA: Diagnosis not present

## 2019-11-23 DIAGNOSIS — L858 Other specified epidermal thickening: Secondary | ICD-10-CM | POA: Diagnosis not present

## 2019-11-23 DIAGNOSIS — L84 Corns and callosities: Secondary | ICD-10-CM | POA: Diagnosis not present

## 2019-11-29 ENCOUNTER — Ambulatory Visit: Payer: Medicare Other | Admitting: Internal Medicine

## 2019-11-30 ENCOUNTER — Ambulatory Visit: Payer: Medicare Other | Admitting: Internal Medicine

## 2019-12-05 DIAGNOSIS — Z23 Encounter for immunization: Secondary | ICD-10-CM | POA: Diagnosis not present

## 2019-12-12 ENCOUNTER — Other Ambulatory Visit: Payer: Self-pay

## 2019-12-12 ENCOUNTER — Ambulatory Visit (INDEPENDENT_AMBULATORY_CARE_PROVIDER_SITE_OTHER): Payer: Medicare Other | Admitting: Internal Medicine

## 2019-12-12 ENCOUNTER — Encounter: Payer: Self-pay | Admitting: Internal Medicine

## 2019-12-12 VITALS — BP 126/88 | HR 55 | Temp 97.6°F | Resp 16 | Ht 78.5 in | Wt 228.6 lb

## 2019-12-12 DIAGNOSIS — I712 Thoracic aortic aneurysm, without rupture, unspecified: Secondary | ICD-10-CM

## 2019-12-12 DIAGNOSIS — I480 Paroxysmal atrial fibrillation: Secondary | ICD-10-CM | POA: Diagnosis not present

## 2019-12-12 DIAGNOSIS — R7309 Other abnormal glucose: Secondary | ICD-10-CM

## 2019-12-12 DIAGNOSIS — I2510 Atherosclerotic heart disease of native coronary artery without angina pectoris: Secondary | ICD-10-CM

## 2019-12-12 DIAGNOSIS — N1831 Chronic kidney disease, stage 3a: Secondary | ICD-10-CM

## 2019-12-12 DIAGNOSIS — I10 Essential (primary) hypertension: Secondary | ICD-10-CM | POA: Diagnosis not present

## 2019-12-12 DIAGNOSIS — E559 Vitamin D deficiency, unspecified: Secondary | ICD-10-CM

## 2019-12-12 DIAGNOSIS — E782 Mixed hyperlipidemia: Secondary | ICD-10-CM | POA: Diagnosis not present

## 2019-12-12 DIAGNOSIS — Z79899 Other long term (current) drug therapy: Secondary | ICD-10-CM | POA: Diagnosis not present

## 2019-12-12 NOTE — Patient Instructions (Signed)
Due to recent changes in healthcare laws, you may see the results of your imaging and laboratory studies on MyChart before your provider has had a chance to review them.  We understand that in some cases there may be results that are confusing or concerning to you. Not all laboratory results come back in the same time frame and the provider may be waiting for multiple results in order to interpret others.  Please give us 48 hours in order for your provider to thoroughly review all the results before contacting the office for clarification of your results.     ++++++++++++++++++++++++++++++++++  Vit D  & Vit C 1,000 mg   are recommended to help protect  against the Covid-19 and other Corona viruses.    Also it's recommended  to take  Zinc 50 mg  to help  protect against the Covid-19   and best place to get  is also on [employer]  and don't pay more than 6-8 cents /pill !   ===================================== Coronavirus (COVID-19) Are you at risk?  Are you at risk for the Coronavirus (COVID-19)?  To be considered HIGH RISK for Coronavirus (COVID-19), you have to meet the following criteria:  . Traveled to [place], [place], [place], [place] or [place]; or in the [place] to [place], [place], [place]  . or [place]; and have fever, cough, and shortness of breath within the last 2 weeks of travel OR . Been in close contact with a person diagnosed with COVID-19 within the last 2 weeks and have  . fever, cough,and shortness of breath .  . IF YOU DO NOT MEET THESE CRITERIA, YOU ARE CONSIDERED LOW RISK FOR COVID-19.  What to do if you are HIGH RISK for COVID-19?  . If you are having a medical emergency, call 911. . Seek medical care right away. Before you go to a doctor's office, urgent care or emergency department, .  call ahead and tell them about your recent travel, contact with someone diagnosed with COVID-19  .  and your symptoms.  . You should receive instructions  from your physician's office regarding next steps of care.  . When you arrive at healthcare provider, tell the healthcare staff immediately you have returned from  . visiting [place], [place], [place], [place] or [place]; or traveled in the [place] to [place], [place],  . [place] or [place] in the last two weeks or you have been in close contact with a person diagnosed with  . COVID-19 in the last 2 weeks.   . Tell the health care staff about your symptoms: fever, cough and shortness of breath. . After you have been seen by a medical provider, you will be either: o Tested for (COVID-19) and discharged home on quarantine except to seek medical care if  o symptoms worsen, and asked to  - Stay home and avoid contact with others until you get your results (4-5 days)  - Avoid travel on public transportation if possible (such as bus, train, or airplane) or o Sent to the Emergency Department by EMS for evaluation, COVID-19 testing  and  o possible admission depending on your condition and test results.  What to do if you are LOW RISK for COVID-19?  Reduce your risk of any infection by using the same precautions used for avoiding the common cold or flu:  . Wash your hands often with soap and warm water for at least 20 seconds.  If soap and water   are not readily available,  . use an alcohol-based hand sanitizer with at least 60% alcohol.  . If coughing or sneezing, cover your mouth and nose by coughing or sneezing into the elbow areas of your shirt or coat, .  into a tissue or into your sleeve (not your hands). . Avoid shaking hands with others and consider head nods or verbal greetings only. . Avoid touching your eyes, nose, or mouth with unwashed hands.  . Avoid close contact with people who are sick. . Avoid places or events with large numbers of people in one location, like concerts or sporting events. . Carefully consider travel plans you have or are making. . If you are planning  any travel outside or inside the [place], visit the CDC's Travelers' Health webpage for the latest health notices. . If you have some symptoms but not all symptoms, continue to monitor at home and seek medical attention  . if your symptoms worsen. . If you are having a medical emergency, call 911.   ++++++++++++++++++++++++++++++++ Recommend Adult Low Dose Aspirin or  coated  Aspirin 81 mg daily  To reduce risk of Colon Cancer 40 %,  Skin Cancer 26 % ,  Melanoma 46%  and  Pancreatic cancer 60% ++++++++++++++++++++++++++++++++ Vitamin D goal  is between 70-100.  Please make sure that you are taking your Vitamin D as directed.  It is very important as a natural anti-inflammatory  helping hair, skin, and nails, as well as reducing stroke and heart attack risk.  It helps your bones and helps with mood. It also decreases numerous cancer risks so please take it as directed.  Low Vit D is associated with a 200-300% higher risk for CANCER  and 200-300% higher risk for HEART   ATTACK  &  STROKE.   ...................................... It is also associated with higher death rate at younger ages,  autoimmune diseases like Rheumatoid arthritis, Lupus, Multiple Sclerosis.    Also many other serious conditions, like depression, Alzheimer's Dementia, infertility, muscle aches, fatigue, fibromyalgia - just to name a few. ++++++++++++++++++++ Recommend the book "The END of DIETING" by Dr Joel Fuhrman  & the book "The END of DIABETES " by Dr Joel Fuhrman At [employer] - get book & Audio CD's    Being diabetic has a  300% increased risk for heart attack, stroke, cancer, and alzheimer- type vascular dementia. It is very important that you work harder with diet by avoiding all foods that are white. Avoid white rice (brown & wild rice is OK), white potatoes (sweetpotatoes in moderation is OK), White bread or wheat bread or anything made out of white flour like bagels, donuts, rolls, buns, biscuits, cakes,  pastries, cookies, pizza crust, and pasta (made from white flour & egg whites) - vegetarian pasta or spinach or wheat pasta is OK. Multigrain breads like Arnold's or Pepperidge [employer], or multigrain sandwich thins or flatbreads.  Diet, exercise and weight loss can reverse and cure diabetes in the early stages.  Diet, exercise and weight loss is very important in the control and prevention of complications of diabetes which affects every system in your body, ie. Brain - dementia/stroke, eyes - glaucoma/blindness, heart - heart attack/heart failure, kidneys - dialysis, stomach - gastric paralysis, intestines - malabsorption, nerves - severe painful neuritis, circulation - gangrene & loss of a leg(s), and finally cancer and Alzheimers.    I recommend avoid fried & greasy foods,  sweets/candy, white rice (brown or wild rice or Quinoa is OK), white potatoes (  sweet potatoes are OK) - anything made from white flour - bagels, doughnuts, rolls, buns, biscuits,white and wheat breads, pizza crust and traditional pasta made of white flour & egg white(vegetarian pasta or spinach or wheat pasta is OK).  Multi-grain bread is OK - like multi-grain flat bread or sandwich thins. Avoid alcohol in excess. Exercise is also important.    Eat all the vegetables you want - avoid meat, especially red meat and dairy - especially cheese.  Cheese is the most concentrated form of trans-fats which is the worst thing to clog up our arteries. Veggie cheese is OK which can be found in the fresh produce section at [employer] or [employer] or [employer]  +++++++++++++++++++++ DASH Eating Plan  DASH stands for "Dietary Approaches to Stop Hypertension."   The DASH eating plan is a healthy eating plan that has been shown to reduce high blood pressure (hypertension). Additional health benefits may include reducing the risk of type 2 diabetes mellitus, heart disease, and stroke. The DASH eating plan may also help with weight loss. WHAT DO I  NEED TO KNOW ABOUT THE DASH EATING PLAN? For the DASH eating plan, you will follow these general guidelines:  Choose foods with a percent daily value for sodium of less than 5% (as listed on the food label).  Use salt-free seasonings or herbs instead of table salt or sea salt.  Check with your health care provider or pharmacist before using salt substitutes.  Eat lower-sodium products, often labeled as "lower sodium" or "no salt added."  Eat fresh foods.  Eat more vegetables, fruits, and low-fat dairy products.  Choose whole grains. Look for the word "whole" as the first word in the ingredient list.  Choose fish   Limit sweets, desserts, sugars, and sugary drinks.  Choose heart-healthy fats.  Eat veggie cheese   Eat more home-cooked food and less restaurant, buffet, and fast food.  Limit fried foods.  Cook foods using methods other than frying.  Limit canned vegetables. If you do use them, rinse them well to decrease the sodium.  When eating at a restaurant, ask that your food be prepared with less salt, or no salt if possible.                      WHAT FOODS CAN I EAT? Read Dr Joel Fuhrman's books on The End of Dieting & The End of Diabetes  Grains Whole grain or whole wheat bread. Brown rice. Whole grain or whole wheat pasta. Quinoa, bulgur, and whole grain cereals. Low-sodium cereals. Corn or whole wheat flour tortillas. Whole grain cornbread. Whole grain crackers. Low-sodium crackers.  Vegetables Fresh or frozen vegetables (raw, steamed, roasted, or grilled). Low-sodium or reduced-sodium tomato and vegetable juices. Low-sodium or reduced-sodium tomato sauce and paste. Low-sodium or reduced-sodium canned vegetables.   Fruits All fresh, canned (in natural juice), or frozen fruits.  Protein Products  All fish and seafood.  Dried beans, peas, or lentils. Unsalted nuts and seeds. Unsalted canned beans.  Dairy Low-fat dairy products, such as skim or 1% milk, 2% or  reduced-fat cheeses, low-fat ricotta or cottage cheese, or plain low-fat yogurt. Low-sodium or reduced-sodium cheeses.  Fats and Oils Tub margarines without trans fats. Light or reduced-fat mayonnaise and salad dressings (reduced sodium). Avocado. Safflower, olive, or canola oils. Natural peanut or almond butter.  Other Unsalted popcorn and pretzels. The items listed above may not be a complete list of recommended foods or beverages. Contact your dietitian for more   options.  +++++++++++++++  WHAT FOODS ARE NOT RECOMMENDED? Grains/ White flour or wheat flour White bread. White pasta. White rice. Refined cornbread. Bagels and croissants. Crackers that contain trans fat.  Vegetables  Creamed or fried vegetables. Vegetables in a . Regular canned vegetables. Regular canned tomato sauce and paste. Regular tomato and vegetable juices.  Fruits Dried fruits. Canned fruit in light or heavy syrup. Fruit juice.  Meat and Other Protein Products Meat in general - RED meat & White meat.  Fatty cuts of meat. Ribs, chicken wings, all processed meats as bacon, sausage, bologna, salami, fatback, hot dogs, bratwurst and packaged luncheon meats.  Dairy Whole or 2% milk, cream, half-and-half, and cream cheese. Whole-fat or sweetened yogurt. Full-fat cheeses or blue cheese. Non-dairy creamers and whipped toppings. Processed cheese, cheese spreads, or cheese curds.  Condiments Onion and garlic salt, seasoned salt, table salt, and sea salt. Canned and packaged gravies. Worcestershire sauce. Tartar sauce. Barbecue sauce. Teriyaki sauce. Soy sauce, including reduced sodium. Steak sauce. Fish sauce. Oyster sauce. Cocktail sauce. Horseradish. Ketchup and mustard. Meat flavorings and tenderizers. Bouillon cubes. Hot sauce. Tabasco sauce. Marinades. Taco seasonings. Relishes.  Fats and Oils Butter, stick margarine, lard, shortening and bacon fat. Coconut, palm kernel, or palm oils. Regular salad  dressings.  Pickles and olives. Salted popcorn and pretzels.  The items listed above may not be a complete list of foods and beverages to avoid.  +++++++++++++++++++++++++++++++++++++++++++ Bleeding Precautions When on Anticoagulant Therapy  Anticoagulant therapy, also called blood thinner therapy, is medicine that helps to prevent and treat blood clots. The medicine works by stopping blood clots from forming or growing. Blood clots that form in your blood vessels can be dangerous. They can break loose and travel to the heart, lungs, or brain. This increases the risk of a heart attack, stroke, or blocked lung artery (pulmonary embolism). Anticoagulants also increase the risk of bleeding. Try to protect yourself from cuts and other injuries that can cause bleeding. It is important to take anticoagulants exactly as told by your health care provider. Why do I need to be on anticoagulant therapy? You may need this medicine if you are at risk of developing a blood clot. Conditions that increase your risk of a blood clot include:  Being born with heart disease or a heart malformation (congenital heart disease).  Developing heart disease.  Having had surgery, such as valve replacement.  Having had a serious accident or other type of severe injury (trauma).  Having certain types of cancer.  Having certain diseases that can increase blood clotting.  Having a high risk of stroke or heart attack.  Having atrial fibrillation (AF). What are the common anticoagulant medicines? There are several types of anticoagulant medicines. The most common types are:  Medicines that you take by mouth (oral medicines), such as: ? Warfarin. ? Novel oral anticoagulants (NOACs), such as:  Direct thrombin inhibitors (dabigatran).  Factor Xa inhibitors (apixaban (Eliquis), edoxaban, and rivaroxaban).  Injections, such as: ? Unfractionated heparin. ? Low molecular weight heparin. These anticoagulants work  in different ways to prevent blood clots. They also have different risks and side effects. What do I need to remember while on anticoagulant therapy? Taking anticoagulants  Take your medicine at the same time every day. If you forget to take your medicine, take it as soon as you remember. Do not double your dosage of medicine if you miss a whole day. Take your normal dose and call your health care provider.  Do not stop  taking your medicine unless your health care provider approves. Stopping the medicine can increase your risk of developing a blood clot. Taking other medicines  Take over-the-counter and prescriptions medicines only as told by your health care provider.  Do not take over-the-counter NSAIDs, including aspirin and ibuprofen, while you are on anticoagulant therapy. These medicines increase your risk of dangerous bleeding.  Get approval from your health care provider before you start taking any new medicines, vitamins, or herbal products. Some of these could interfere with your therapy. General instructions  Keep all follow-up visits as told by your health care provider. This is important.  If you are pregnant or trying to get pregnant, talk with a health care provider about anticoagulants. Some of these medicines are not safe to take during pregnancy.  Tell all health care providers, including your dentist, that you are on anticoagulant therapy. It is especially important to tell providers before you have any surgery, medical procedures, or dental work done. What precautions should I take?   Be very careful when using knives, scissors, or other sharp objects.  Use an electric razor instead of a blade.  Do not use toothpicks.  Use a soft-bristled toothbrush. Brush your teeth gently.  Always wear shoes outdoors and wear slippers indoors.  Be careful when cutting your fingernails and toenails.  Place bath mats in the bathroom. If possible, install handrails as  well.  Wear gloves while you do yard work.  Wear your seat belt.  Prevent falls by removing loose rugs and extension cords from areas where you walk. Use a cane or walker if you need it.  Avoid constipation by: ? Drinking enough fluid to keep your urine clear or pale yellow. ? Eating foods that are high in fiber, such as fresh fruits and vegetables, whole grains, and beans. ? Limiting foods that are high in fat and processed sugars, such as fried and sweet foods.  Do not play contact sports or participate in other activities that have a high risk for injury. What other precautions are important if on warfarin therapy? If you are taking a type of anticoagulant called warfarin, make sure you:  Work with a diet and nutrition specialist (dietitian) to make an eating plan. Do not make any sudden changes to your diet after you have started your eating plan.  Do not drink alcohol. It can interfere with your medicine and increase your risk of an injury that causes bleeding.  Get regular blood tests as told by your health care provider. What are some questions to ask my health care provider?  Why do I need anticoagulant therapy?  What is the best anticoagulant therapy for my condition?  How long will I need anticoagulant therapy?  What are the side effects of anticoagulant therapy?  When should I take my medicine? What should I do if I forget to take it?  Will I need to have regular blood tests?  Do I need to change my diet? Are there foods or drinks that I should avoid?  What activities are safe for me?  What should I do if I want to get pregnant? Contact a health care provider if:  You miss a dose of medicine: ? And you are not sure what to do. ? For more than one day.  You have: ? Menstrual bleeding that is heavier than normal. ? Bloody or brown urine. ? Easy bruising. ? Black and tarry stool or bright red stool. ? Side effects from your medicine.  You feel weak or  dizzy.  You become pregnant. Get help right away if:  You have bleeding that will not stop within 20 minutes from: ? The nose. ? The gums. ? A cut on the skin.  You have a severe headache or stomachache.  You vomit or cough up blood.  You fall or hit your head. Summary  Anticoagulant therapy, also called blood thinner therapy, is medicine that helps to prevent and treat blood clots.  Anticoagulants work in different ways to prevent blood clots. They also have different risks and side effects.  Talk with your health care provider about any precautions that you should take while on anticoagulant therapy.

## 2019-12-12 NOTE — Progress Notes (Signed)
History of Present Illness:       This very nice 74 y.o.  MWM presents for 6 month follow up with HTN, ASHD /pAfib, HLD, Pre-Diabetes and Vitamin D Deficiency. In 2014, patient had a radical Prostatectomy by Dr [name].      Patient is treated for HTN & BP has been controlled at home. Today's BP is at goal - 126/88.   Heart cath in 2018 showed Non-obstructive  Cor Aa.  Patient has mild Ao /dilated Ao root & Mitral Insufficiency by echo  And patient is followed by Dr [name].  Patient also  has CKD3a (GFR 52) attributed to his HTN. Patient has had no complaints of any cardiac type chest pain, palpitations, dyspnea / orthopnea / PND, dizziness, claudication, or dependent edema.      Hyperlipidemia is controlled with diet &  Simvastatin /Ezetimibe. Patient denies myalgias or other med SE's. Last Lipids were at goal:  Lab Results  Component Value Date   CHOL 146 08/14/2019   HDL 76 08/14/2019   LDLCALC 56 08/14/2019   TRIG 59 08/14/2019   CHOLHDL 1.9 08/14/2019    Also, the patient is followed expectantly for glucose intolerance and has had no symptoms of reactive hypoglycemia, diabetic polys, paresthesias or visual blurring.  Last A1c was   Lab Results  Component Value Date   HGBA1C 4.8 05/11/2019       Further, the patient also has history of Vitamin D Deficiency ("24" /2008)and supplements vitamin D without any suspected side-effects. Last vitamin D was at goal:  Lab Results  Component Value Date   VD25OH 60 05/11/2019    Current Outpatient Medications on File Prior to Visit  Medication Sig  . ALPRAZolam (XANAX) 1 MG tablet Take 1/2 - 1 tablet 2    at Bedtime     ONLY if needed for Sleep  &  limit to 5 days /week to avoid Addiction & Dementia  . apixaban (ELIQUIS) 5 MG TABS tablet Take 1 tablet (5 mg total) by mouth 2 (two) times daily.  Marland Kitchen atenolol (TENORMIN) 25 MG tablet Take 1 tablet (25 mg total) by mouth daily.  . Cholecalciferol (VITAMIN D3) 5000 UNITS CAPS Take  5,000 Int'l Units by mouth daily.  . diphenhydrAMINE (BENADRYL) 25 mg capsule Take 25 mg by mouth at bedtime as needed for sleep.  Marland Kitchen ezetimibe (ZETIA) 10 MG tablet Take 1 tablet (10 mg total) by mouth daily.  . Glucos-Chond-Hyal Ac-Ca Fructo (MOVE FREE JOINT HEALTH ADVANCE) TABS Take 1 tablet by mouth every morning.  Marland Kitchen glucosamine-chondroitin 500-400 MG tablet Take 1 tablet by mouth daily.  . hydrochlorothiazide (HYDRODIURIL) 25 MG tablet Take 1 tablet (25 mg total) by mouth daily.  Marland Kitchen ibuprofen (ADVIL) 200 MG tablet Take by mouth.  . losartan (COZAAR) 100 MG tablet TAKE 1 TABLET BY MOUTH DAILY FOR BLOOD PRESSURE  . simvastatin (ZOCOR) 40 MG tablet TAKE 1 TABLET BY MOUTH EVERY DAY FOR CHOLESTEROL  . valACYclovir (VALTREX) 500 MG tablet TAKE 1 TABLET BY MOUTH DAILY FOR FEVER BLISTERS AS DIRECTED   No current facility-administered medications on file prior to visit.    Allergies  Allergen Reactions  . Ultram [Tramadol] Anaphylaxis  . Atorvastatin     Other reaction(s): Myalgias (intolerance)  . Doxazosin Other (See Comments)    Dizziness   . Telmisartan Hypertension and Other (See Comments)    PMHx:   Past Medical History:  Diagnosis Date  . Anxiety   . Coronary  artery disease   . Hyperlipidemia   . Hypertension   . ICH (intracerebral hemorrhage) ([place])   . Other testicular hypofunction   . Permanent atrial fibrillation ([place])   . Prostate cancer ([place])   . Vitamin D deficiency     Immunization History  Administered Date(s) Administered  . Fluad Quad(high Dose 65+) 12/05/2019  . Influenza, High Dose Seasonal PF 01/31/2015, 12/03/2017  . Influenza-Unspecified 12/19/2015, 12/24/2016  . MMR 02/23/2005  . PFIZER SARS-COV-2 Vaccination 03/19/2019, 04/11/2019, 10/27/2019  . Pneumococcal Conjugate-13 01/31/2015  . Pneumococcal Polysaccharide-23 02/23/2010, 03/30/2016  . Tdap 02/23/2005    Past Surgical History:  Procedure Laterality Date  . COLONOSCOPY    . LYMPHADENECTOMY  Bilateral 06/23/2012   Procedure: LYMPHADENECTOMY;  Surgeon: Dutch Gray, MD;  Location: [facility];  Service: Urology;  Laterality: Bilateral;  . ROBOT ASSISTED LAPAROSCOPIC RADICAL PROSTATECTOMY N/A 06/23/2012   Procedure: ROBOTIC ASSISTED LAPAROSCOPIC RADICAL PROSTATECTOMY LEVEL 2;  Surgeon: Dutch Gray, MD;  Location: [facility];  Service: Urology;  Laterality: N/A;    FHx:    Reviewed / unchanged  SHx:    Reviewed / unchanged   Systems Review:  Constitutional: Denies fever, chills, wt changes, headaches, insomnia, fatigue, night sweats, change in appetite. Eyes: Denies redness, blurred vision, diplopia, discharge, itchy, watery eyes.  ENT: Denies discharge, congestion, post nasal drip, epistaxis, sore throat, earache, hearing loss, dental pain, tinnitus, vertigo, sinus pain, snoring.  CV: Denies chest pain, palpitations, irregular heartbeat, syncope, dyspnea, diaphoresis, orthopnea, PND, claudication or edema. Respiratory: denies cough, dyspnea, DOE, pleurisy, hoarseness, laryngitis, wheezing.  Gastrointestinal: Denies dysphagia, odynophagia, heartburn, reflux, water brash, abdominal pain or cramps, nausea, vomiting, bloating, diarrhea, constipation, hematemesis, melena, hematochezia  or hemorrhoids. Genitourinary: Denies dysuria, frequency, urgency, nocturia, hesitancy, discharge, hematuria or flank pain. Musculoskeletal: Denies arthralgias, myalgias, stiffness, jt. swelling, pain, limping or strain/sprain.  Skin: Denies pruritus, rash, hives, warts, acne, eczema or change in skin lesion(s). Neuro: No weakness, tremor, incoordination, spasms, paresthesia or pain. Psychiatric: Denies confusion, memory loss or sensory loss. Endo: Denies change in weight, skin or hair change.  Heme/Lymph: No excessive bleeding, bruising or enlarged lymph nodes.  Physical Exam  BP 126/88   Pulse (!) 55   Temp 97.6 F (36.4 C)   Resp 16   Ht 6' 6.5" (1.994 m)   Wt 228 lb 9.6 oz (103.7 kg)   SpO2 99%   BMI  26.08 kg/m   Appears  well nourished, well groomed  and in no distress.  Eyes: PERRLA, EOMs, conjunctiva no swelling or erythema. Sinuses: No frontal/maxillary tenderness ENT/Mouth: EAC's clear, TM's nl w/o erythema, bulging. Nares clear w/o erythema, swelling, exudates. Oropharynx clear without erythema or exudates. Oral hygiene is good. Tongue normal, non obstructing. Hearing intact.  Neck: Supple. Thyroid not palpable. Car 2+/2+ without bruits, nodes or JVD. Chest: Respirations nl with BS clear & equal w/o rales, rhonchi, wheezing or stridor.  Cor: Heart sounds normal w/ regular rate and rhythm without sig. murmurs, gallops, clicks or rubs. Peripheral pulses normal and equal  without edema.  Abdomen: Soft & bowel sounds normal. Non-tender w/o guarding, rebound, hernias, masses or organomegaly.  Lymphatics: Unremarkable.  Musculoskeletal: Full ROM all peripheral extremities, joint stability, 5/5 strength and normal gait.  Skin: Warm, dry without exposed rashes, lesions or ecchymosis apparent.  Neuro: Cranial nerves intact, reflexes equal bilaterally. Sensory-motor testing grossly intact. Tendon reflexes grossly intact.  Pysch: Alert & oriented x 3.  Insight and judgement nl & appropriate. No ideations.  Assessment and Plan:  1. Essential hypertension  - Continue medication, monitor blood pressure at home.  - Continue DASH diet.  Reminder to go to the ER if any CP,  SOB, nausea, dizziness, severe HA, changes vision/speech.  - CBC with Differential/Platelet - COMPLETE METABOLIC PANEL WITH GFR - Magnesium - TSH  2. Hyperlipidemia, mixed  - Continue diet/meds, exercise,& lifestyle modifications.  - Continue monitor periodic cholesterol/liver & renal functions    - Lipid panel - TSH  3. Abnormal glucose  - Hemoglobin A1c - Insulin, random  4. Vitamin D deficiency  - Continue diet, exercise  - Lifestyle modifications.  - Monitor appropriate labs. - Continue  supplementation.  - VITAMIN D 25 Hydroxy  5. Coronary artery disease involving native  Coronary artery without angina pectoris  - Lipid panel  6. Paroxysmal atrial fibrillation (HCC)  - TSH  7. Stage 3a chronic kidney disease (HCC)  - COMPLETE METABOLIC PANEL WITH GFR  8. Thoracic aortic aneurysm without rupture ([place])   9. Medication management  - CBC with Differential/Platelet - COMPLETE METABOLIC PANEL WITH GFR - Magnesium - Lipid panel - TSH - Hemoglobin A1c - Insulin, random - VITAMIN D 25 Hydroxy         Discussed  regular exercise, BP monitoring, weight control to achieve/maintain BMI less than 25 and discussed med and SE's. Recommended labs to assess and monitor clinical status with further disposition pending results of labs.  I discussed the assessment and treatment plan with the patient. The patient was provided an opportunity to ask questions and all were answered. The patient agreed with the plan and demonstrated an understanding of the instructions.  I provided over 30 minutes of exam, counseling, chart review and  complex critical decision making.   Kirtland Bouchard, MD

## 2019-12-13 LAB — COMPLETE METABOLIC PANEL WITH GFR
AG Ratio: 1.7 (calc) (ref 1.0–2.5)
ALT: 19 U/L (ref 9–46)
AST: 27 U/L (ref 10–35)
Albumin: 4.4 g/dL (ref 3.6–5.1)
Alkaline phosphatase (APISO): 62 U/L (ref 35–144)
BUN/Creatinine Ratio: 20 (calc) (ref 6–22)
BUN: 26 mg/dL — ABNORMAL HIGH (ref 7–25)
CO2: 29 mmol/L (ref 20–32)
Calcium: 9.5 mg/dL (ref 8.6–10.3)
Chloride: 103 mmol/L (ref 98–110)
Creat: 1.31 mg/dL — ABNORMAL HIGH (ref 0.70–1.18)
GFR, Est African American: 62 mL/min/{1.73_m2} (ref >=60)
GFR, Est Non African American: 53 mL/min/{1.73_m2} — ABNORMAL LOW (ref >=60)
Globulin: 2.6 g/dL (calc) (ref 1.9–3.7)
Glucose, Bld: 103 mg/dL — ABNORMAL HIGH (ref 65–99)
Potassium: 4.3 mmol/L (ref 3.5–5.3)
Sodium: 140 mmol/L (ref 135–146)
Total Bilirubin: 2.5 mg/dL — ABNORMAL HIGH (ref 0.2–1.2)
Total Protein: 7 g/dL (ref 6.1–8.1)

## 2019-12-13 LAB — LIPID PANEL
Cholesterol: 168 mg/dL (ref <200)
HDL: 84 mg/dL (ref >=40)
LDL Cholesterol (Calc): 68 mg/dL (calc)
Non-HDL Cholesterol (Calc): 84 mg/dL (calc) (ref <130)
Total CHOL/HDL Ratio: 2 (calc) (ref <5.0)
Triglycerides: 77 mg/dL (ref <150)

## 2019-12-13 LAB — CBC WITH DIFFERENTIAL/PLATELET
Absolute Monocytes: 439 cells/uL (ref 200–950)
Basophils Absolute: 82 cells/uL (ref 0–200)
Basophils Relative: 1.9 %
Eosinophils Absolute: 241 cells/uL (ref 15–500)
Eosinophils Relative: 5.6 %
HCT: 43.1 % (ref 38.5–50.0)
Hemoglobin: 14.6 g/dL (ref 13.2–17.1)
Lymphs Abs: 1264 cells/uL (ref 850–3900)
MCH: 33.5 pg — ABNORMAL HIGH (ref 27.0–33.0)
MCHC: 33.9 g/dL (ref 32.0–36.0)
MCV: 98.9 fL (ref 80.0–100.0)
MPV: 9.5 fL (ref 7.5–12.5)
Monocytes Relative: 10.2 %
Neutro Abs: 2275 cells/uL (ref 1500–7800)
Neutrophils Relative %: 52.9 %
Platelets: 199 10*3/uL (ref 140–400)
RBC: 4.36 10*6/uL (ref 4.20–5.80)
RDW: 12 % (ref 11.0–15.0)
Total Lymphocyte: 29.4 %
WBC: 4.3 10*3/uL (ref 3.8–10.8)

## 2019-12-13 LAB — TSH: TSH: 2.02 mIU/L (ref 0.40–4.50)

## 2019-12-13 LAB — HEMOGLOBIN A1C
Hgb A1c MFr Bld: 5 % of total Hgb (ref <5.7)
Mean Plasma Glucose: 97 (calc)
eAG (mmol/L): 5.4 (calc)

## 2019-12-13 LAB — INSULIN, RANDOM: Insulin: 5.7 u[IU]/mL

## 2019-12-13 LAB — VITAMIN D 25 HYDROXY (VIT D DEFICIENCY, FRACTURES): Vit D, 25-Hydroxy: 61 ng/mL (ref 30–100)

## 2019-12-13 LAB — MAGNESIUM: Magnesium: 2.3 mg/dL (ref 1.5–2.5)

## 2019-12-13 NOTE — Progress Notes (Signed)
========================================================== -   Test results slightly outside the reference range are not unusual. If there is anything important, I will review this with you,  otherwise it is considered normal test values.  If you have further questions,  please do not hesitate to contact me at the office or via My Chart.  ==========================================================  -  Kidney functions remain Stage 3a & Stable  ==========================================================  -  Total Chol = 168 and LDL Chol = 68 - Both  Excellent   - Very low risk for Heart Attack  / Stroke ==========================================================  - A1c - Normal - great - No Diabetes ==========================================================  -  Vitamin D = 61 - Excellent  ==========================================================  -  All Else - CBC - Kidneys - Electrolytes - Liver - Magnesium & Thyroid    - all  Normal / OK ==========================================================  - Keep up the [place] Work  ! ==========================================================

## 2019-12-16 ENCOUNTER — Other Ambulatory Visit: Payer: Self-pay | Admitting: Internal Medicine

## 2020-01-31 DIAGNOSIS — D1801 Hemangioma of skin and subcutaneous tissue: Secondary | ICD-10-CM | POA: Diagnosis not present

## 2020-01-31 DIAGNOSIS — Z85828 Personal history of other malignant neoplasm of skin: Secondary | ICD-10-CM | POA: Diagnosis not present

## 2020-01-31 DIAGNOSIS — L812 Freckles: Secondary | ICD-10-CM | POA: Diagnosis not present

## 2020-01-31 DIAGNOSIS — L821 Other seborrheic keratosis: Secondary | ICD-10-CM | POA: Diagnosis not present

## 2020-01-31 DIAGNOSIS — L910 Hypertrophic scar: Secondary | ICD-10-CM | POA: Diagnosis not present

## 2020-04-08 NOTE — Progress Notes (Signed)
FOLLOW UP 3 MONTH  Assessment:   Diagnoses and all orders for this visit:  Paroxysmal atrial fibrillation ([place]) Chadsvasc 2; on elequis Followed by cardiology  Essential hypertension Continue medication: Atenolol 82m, HCTZ 270m losartan 10033monitor blood pressure at home; call if consistently over 130/80 Continue DASH diet.   Reminder to go to the ER if any CP, SOB, nausea, dizziness, severe HA, changes vision/speech, left arm numbness and tingling and jaw pain.  [place]; followed by Dr. BorAlinda Moneyitamin D deficiency At goal at recent check; continue to recommend supplementation for goal of 60-100 Defer vitamin D level  Mixed hyperlipidemia Continue medications: simvastatin 20m93metia 10mg77mnsider rosuvastatin 5mg C18minue low cholesterol diet and exercise.  Monitor lipid panel.   Glaucoma suspect, bilateral Followed by Dr. Bond  Edilia Bo27.0-27.9,adult Continue to recommend diet heavy in fruits and veggies and low in animal meats, cheeses, and dairy products, appropriate calorie intake Discuss exercise recommendations routinely Continue to monitor weight at each visit  Anxiety Uses xanax sporadically PRN; Well managed by current regimen; reminded to avoid daily use of benzo Stress management techniques discussed, increase water, good sleep hygiene discussed, increase exercise, and increase veggies.   Abnormal glucose Recent A1Cs at goal Discussed diet/exercise, weight management  Defer A1C to CPE  CKD (chronic kidney disease) stage 3, GFR 30-59 ml/min (HCC) Increase fluids  Avoid NSAIDS Blood pressure control Monitor sugars  Will continue to monitor   Cold sores Valtrex PRN flare   Medication management Continued  Over 30 minutes of face to face interview, exam, counseling, chart review, and critical decision making was performed  Future Appointments  Date Time Provider [place]/2022  9:20 AM  CrenshLelon PerlaVD[facility]  2:00 PM McKeowUnk PintoAAM-[facility] None  08/27/2020  9:00 AM McClanGarnet SierrasAAM-GAAIM None      Subjective:  Kenneth PRABHU74 y.o53male who presents for Medicare Annual Wellness Visit and 3 month follow up for HTN, hyperlipidemia, glucose management, and vitamin D Def.   He would like to discuss his statin medication today. Reports his goal is 50-60 he is wondering if he should change his medication to improve this?  Will check lipids today.  Patient is S/p Radical Prostatectomy in 2014 by Dr. BordenAlinda Moneyows annually, no issues.   He had a Negative Cardiolite in 2007. In Jan 2018, he had a negative heart cath evaluating new onset pAfib/Flutter. In Mar 2018, he had CV [facility] [place]. Patient denies any cardiac symptoms. He golfs or exercises daily.  He reports he is now under the care of Dr. FitzgeOla Spurrad repeat cardioversion 3 weeks ago after holter showed repeat a. flutter, feeling well since that time, will follow up in August 2020. Last OV was 10/2019 taking eliquis, discussed of ablation, on hold for now.  He has recurrent mouth sores, takes valtrex PRN for flares.   BMI is Body mass index is 26.81 kg/m., he has been working on diet and exercise. Very active, exercises at least 60 min daily, cycling, weights, tennis, golf.  Wt Readings from Last 3 Encounters:  04/09/20 235 lb (106.6 kg)  12/12/19 228 lb 9.6 oz (103.7 kg)  10/26/19 229 lb 12.8 oz (104.2 kg)   His blood pressure has been controlled at home, today their BP is BP: 120/82 He does workout. He denies chest pain,  shortness of breath, dizziness.   He is on cholesterol medication (Simvastatin Zetia) and denies myalgias. His cholesterol is at goal. The cholesterol last visit was:   Lab Results  Component Value Date   CHOL 168 12/12/2019   HDL 84 12/12/2019   LDLCALC 68 12/12/2019   TRIG 77  12/12/2019   CHOLHDL 2.0 12/12/2019   He has been working on diet and exercise for glucose management, and denies increased appetite, nausea, paresthesia of the feet, polydipsia, polyuria and visual disturbances. Last A1C in the office was:  Lab Results  Component Value Date   HGBA1C 5.0 12/12/2019   Last GFR Lab Results  Component Value Date   GFRNONAA 53 (L) 12/12/2019   Patient is on Vitamin D supplement.   Lab Results  Component Value Date   VD25OH 61 12/12/2019      Medication Review:   Current Outpatient Medications (Cardiovascular):  .  atenolol (TENORMIN) 25 MG tablet, Take 1 tablet (25 mg total) by mouth daily. Marland Kitchen  ezetimibe (ZETIA) 10 MG tablet, TAKE 1 TABLET BY MOUTH DAILY .  hydrochlorothiazide (HYDRODIURIL) 25 MG tablet, Take 1 tablet (25 mg total) by mouth daily. Marland Kitchen  losartan (COZAAR) 100 MG tablet, TAKE 1 TABLET BY MOUTH DAILY FOR BLOOD PRESSURE .  simvastatin (ZOCOR) 40 MG tablet, TAKE 1 TABLET BY MOUTH EVERY DAY FOR CHOLESTEROL  Current Outpatient Medications (Respiratory):  .  diphenhydrAMINE (BENADRYL) 25 mg capsule, Take 25 mg by mouth at bedtime as needed for sleep.  Current Outpatient Medications (Analgesics):  .  ibuprofen (ADVIL) 200 MG tablet, Take by mouth.  Current Outpatient Medications (Hematological):  .  apixaban (ELIQUIS) 5 MG TABS tablet, Take 1 tablet (5 mg total) by mouth 2 (two) times daily.  Current Outpatient Medications (Other):  Marland Kitchen  ALPRAZolam (XANAX) 1 MG tablet, Take 1/2 - 1 tablet 2    at Bedtime     ONLY if needed for Sleep  &  limit to 5 days /week to avoid Addiction & Dementia .  Cholecalciferol (VITAMIN D3) 5000 UNITS CAPS, Take 5,000 Int'l Units by mouth daily. .  Glucos-Chond-Hyal Ac-Ca Fructo (MOVE FREE JOINT HEALTH ADVANCE) TABS, Take 1 tablet by mouth every morning. Marland Kitchen  glucosamine-chondroitin 500-400 MG tablet, Take 1 tablet by mouth daily. .  valACYclovir (VALTREX) 500 MG tablet, TAKE 1 TABLET BY MOUTH DAILY FOR FEVER  BLISTERS AS DIRECTED  Allergies: Allergies  Allergen Reactions  . Ultram [Tramadol] Anaphylaxis  . Atorvastatin     Other reaction(s): Myalgias (intolerance)  . Doxazosin Other (See Comments)    Dizziness   . Telmisartan Hypertension and Other (See Comments)    Current Problems (verified) has Hyperlipidemia, LDL goal <50; Hypertension; Anxiety; Vitamin D deficiency; Prostate cancer ([place]); Medication management; Abnormal glucose; BMI 27.0-27.9,adult; Atrial flutter ([place]); Glaucoma suspect, bilateral; Paroxysmal atrial fibrillation (HCC); CKD (chronic kidney disease) stage 3, GFR 30-59 ml/min ([place]); Tortuous aorta ([place]); Thoracic aortic aneurysm without rupture ([place]); Pneumonia of right lower lobe due to infectious organism; and Coronary artery disease involving native coronary artery of native heart without angina pectoris on their problem list.  Screening Tests Immunization History  Administered Date(s) Administered  . Fluad Quad(high Dose 65+) 12/05/2019  . Influenza, High Dose Seasonal PF 01/31/2015, 12/03/2017  . Influenza-Unspecified 12/19/2015, 12/24/2016  . MMR 02/23/2005  . PFIZER([place])SARS-COV-2 Vaccination 03/19/2019, 04/11/2019, 10/27/2019  . Pneumococcal Conjugate-13 01/31/2015  . Pneumococcal Polysaccharide-23 02/23/2010, 03/30/2016  . Tdap 02/23/2005    Preventative care: Last colonoscopy: 2014 due 2024  Prior  vaccinations: TD or Tdap: 2007   Influenza: 2021 Pneumococcal: 2018 Prevnar13: 2016 Shingles/Zostavax:  Discussed with patient  Names of Other Physician/Practitioners you currently use: 1. [facility] here for primary care 2. [name], eye doctor, last visit 2021, monitoring glaucoma, goes annualy 3. Dr. Gloriann Loan, dentist, last visit, last visit 2021  Patient Care Team: Unk Pinto, MD as PCP - General (Internal Medicine) [name] Denice Bors, MD as PCP - Cardiology (Cardiology) Raynelle Bring, MD as  Consulting Physician (Urology) Inda Castle, MD (Inactive) as Consulting Physician (Gastroenterology) Susa Day, MD as Consulting Physician (Orthopedic Surgery) Jarome Matin, MD as Consulting Physician (Dermatology) Bond, Tracie Harrier, MD as Referring Physician (Ophthalmology) Ebbie Ridge, MD as Referring Physician (Cardiology)  Surgical: He  has a past surgical history that includes Colonoscopy; Robot assisted laparoscopic radical prostatectomy (N/A, 06/23/2012); and Lymphadenectomy (Bilateral, 06/23/2012). Family His family history includes Colon cancer in his father. Social history  He reports that he has never smoked. He has never used smokeless tobacco. He reports current alcohol use of about 5.0 standard drinks of alcohol per week. He reports that he does not use drugs.   Objective:   Today's Vitals   04/09/20 0942  BP: 120/82  Pulse: (!) 57  Temp: 97.6 F (36.4 C)  SpO2: 99%  Weight: 235 lb (106.6 kg)   Body mass index is 26.81 kg/m.   Physical Exam:  BP 120/82   Pulse (!) 57   Temp 97.6 F (36.4 C)   Wt 235 lb (106.6 kg)   SpO2 99%   BMI 26.81 kg/m   General Appearance: Well nourished, in no apparent distress. Eyes: PERRLA, EOMs, conjunctiva no swelling or erythema Sinuses: No Frontal/maxillary tenderness ENT/Mouth: Ext aud canals clear, TMs without erythema, bulging. No erythema, swelling, or exudate on post pharynx.  Tonsils not swollen or erythematous. Hearing normal.  Neck: Supple, thyroid normal.  Respiratory: Respiratory effort normal, BS equal bilaterally without rales, rhonchi, wheezing or stridor.  Cardio: RRR with no MRGs. Brisk peripheral pulses with scant non-pitting edema   Abdomen: Soft, obese/mildly distended, + BS.  Non tender, no guarding, rebound, hernias, masses. Lymphatics: Non tender without lymphadenopathy.  Musculoskeletal: Full ROM, 5/5 strength, normal gait.  Skin: Warm, dry without rashes, lesions; he has fragile skin  and numerous small ecchymoses to bilateral upper extremities Neuro: Cranial nerves intact. Normal muscle tone, no cerebellar symptoms. Sensation intact.  Psych: Awake and oriented X 3, normal affect, Insight and Judgment appropriate.    Garnet Sierras, Laqueta Jean, DNP [facility] 04/09/2020  1:46 PM

## 2020-04-09 ENCOUNTER — Ambulatory Visit (INDEPENDENT_AMBULATORY_CARE_PROVIDER_SITE_OTHER): Payer: Medicare Other | Admitting: Adult Health Nurse Practitioner

## 2020-04-09 ENCOUNTER — Other Ambulatory Visit: Payer: Self-pay

## 2020-04-09 ENCOUNTER — Encounter: Payer: Self-pay | Admitting: Adult Health Nurse Practitioner

## 2020-04-09 VITALS — BP 120/82 | HR 57 | Temp 97.6°F | Wt 235.0 lb

## 2020-04-09 DIAGNOSIS — I480 Paroxysmal atrial fibrillation: Secondary | ICD-10-CM

## 2020-04-09 DIAGNOSIS — E559 Vitamin D deficiency, unspecified: Secondary | ICD-10-CM

## 2020-04-09 DIAGNOSIS — I10 Essential (primary) hypertension: Secondary | ICD-10-CM | POA: Diagnosis not present

## 2020-04-09 DIAGNOSIS — I4892 Unspecified atrial flutter: Secondary | ICD-10-CM | POA: Diagnosis not present

## 2020-04-09 DIAGNOSIS — E782 Mixed hyperlipidemia: Secondary | ICD-10-CM

## 2020-04-10 LAB — CBC WITH DIFFERENTIAL/PLATELET
Absolute Monocytes: 396 cells/uL (ref 200–950)
Basophils Absolute: 51 cells/uL (ref 0–200)
Basophils Relative: 1.7 %
Eosinophils Absolute: 213 cells/uL (ref 15–500)
Eosinophils Relative: 7.1 %
HCT: 41.9 % (ref 38.5–50.0)
Hemoglobin: 14.3 g/dL (ref 13.2–17.1)
Lymphs Abs: 1089 cells/uL (ref 850–3900)
MCH: 34 pg — ABNORMAL HIGH (ref 27.0–33.0)
MCHC: 34.1 g/dL (ref 32.0–36.0)
MCV: 99.5 fL (ref 80.0–100.0)
MPV: 9.7 fL (ref 7.5–12.5)
Monocytes Relative: 13.2 %
Neutro Abs: 1251 cells/uL — ABNORMAL LOW (ref 1500–7800)
Neutrophils Relative %: 41.7 %
Platelets: 169 10*3/uL (ref 140–400)
RBC: 4.21 10*6/uL (ref 4.20–5.80)
RDW: 12 % (ref 11.0–15.0)
Total Lymphocyte: 36.3 %
WBC: 3 10*3/uL — ABNORMAL LOW (ref 3.8–10.8)

## 2020-04-10 LAB — COMPLETE METABOLIC PANEL WITH GFR
AG Ratio: 2 (calc) (ref 1.0–2.5)
ALT: 21 U/L (ref 9–46)
AST: 30 U/L (ref 10–35)
Albumin: 4.3 g/dL (ref 3.6–5.1)
Alkaline phosphatase (APISO): 55 U/L (ref 35–144)
BUN/Creatinine Ratio: 16 (calc) (ref 6–22)
BUN: 22 mg/dL (ref 7–25)
CO2: 30 mmol/L (ref 20–32)
Calcium: 8.9 mg/dL (ref 8.6–10.3)
Chloride: 104 mmol/L (ref 98–110)
Creat: 1.35 mg/dL — ABNORMAL HIGH (ref 0.70–1.18)
GFR, Est African American: 60 mL/min/{1.73_m2} (ref >=60)
GFR, Est Non African American: 51 mL/min/{1.73_m2} — ABNORMAL LOW (ref >=60)
Globulin: 2.1 g/dL (calc) (ref 1.9–3.7)
Glucose, Bld: 98 mg/dL (ref 65–99)
Potassium: 4.7 mmol/L (ref 3.5–5.3)
Sodium: 141 mmol/L (ref 135–146)
Total Bilirubin: 2.7 mg/dL — ABNORMAL HIGH (ref 0.2–1.2)
Total Protein: 6.4 g/dL (ref 6.1–8.1)

## 2020-04-10 LAB — LIPID PANEL
Cholesterol: 140 mg/dL (ref <200)
HDL: 76 mg/dL (ref >=40)
LDL Cholesterol (Calc): 51 mg/dL (calc)
Non-HDL Cholesterol (Calc): 64 mg/dL (calc) (ref <130)
Total CHOL/HDL Ratio: 1.8 (calc) (ref <5.0)
Triglycerides: 46 mg/dL (ref <150)

## 2020-04-18 DIAGNOSIS — H40003 Preglaucoma, unspecified, bilateral: Secondary | ICD-10-CM | POA: Diagnosis not present

## 2020-04-22 ENCOUNTER — Other Ambulatory Visit: Payer: Self-pay | Admitting: Internal Medicine

## 2020-04-22 MED ORDER — SIMVASTATIN 40 MG PO TABS: ORAL_TABLET | ORAL | 0 refills | Status: DC

## 2020-05-11 NOTE — Progress Notes (Unsigned)
HPI: FU atrial fibrillation and coronary artery disease.  Patient previously followed by Dr. Adrian Prows in [facility]. Cardiac catheterization 2018 showed normal coronary arteries. CTA April 2021 showed no pulmonary embolus, dilated aortic root at 4.2 cm, nonobstructive coronary disease other than severe stenosis in ramus intermedius. Nuclear study May 2021 showed ejection fraction 51% and no ischemia or infarction. Patient previously treated with flecainide for atrial fibrillation but this was discontinued.  He has therefore been treated with rate control and anticoagulation.  Last echocardiogram September 2021 showed ejection fraction 60 to 65%, mild left ventricular hypertrophy, severe left atrial enlargement, moderate right atrial enlargement.  Since last seen, the patient denies any dyspnea on exertion, orthopnea, PND, pedal edema, palpitations, syncope or chest pain.   Current Outpatient Medications  Medication Sig Dispense Refill  . ALPRAZolam (XANAX) 1 MG tablet Take 1/2 - 1 tablet 2    at Bedtime     ONLY if needed for Sleep  &  limit to 5 days /week to avoid Addiction & Dementia 30 tablet 0  . apixaban (ELIQUIS) 5 MG TABS tablet Take 1 tablet (5 mg total) by mouth 2 (two) times daily. 180 tablet 3  . atenolol (TENORMIN) 25 MG tablet Take 1 tablet (25 mg total) by mouth daily. 90 tablet 3  . Cholecalciferol (VITAMIN D3) 5000 UNITS CAPS Take 5,000 Int'l Units by mouth daily.    . diphenhydrAMINE (BENADRYL) 25 mg capsule Take 25 mg by mouth at bedtime as needed for sleep.    Marland Kitchen ezetimibe (ZETIA) 10 MG tablet TAKE 1 TABLET BY MOUTH DAILY 90 tablet 3  . Glucos-Chond-Hyal Ac-Ca Fructo (MOVE FREE JOINT HEALTH ADVANCE) TABS Take 1 tablet by mouth every morning.    Marland Kitchen glucosamine-chondroitin 500-400 MG tablet Take 1 tablet by mouth daily.    . hydrochlorothiazide (HYDRODIURIL) 25 MG tablet Take 1 tablet (25 mg total) by mouth daily. 90 tablet 3  . ibuprofen (ADVIL) 200 MG tablet Take by  mouth.    . losartan (COZAAR) 100 MG tablet TAKE 1 TABLET BY MOUTH DAILY FOR BLOOD PRESSURE 90 tablet 3  . simvastatin (ZOCOR) 40 MG tablet Take  1 tablet  Daily  for Cholesterol            TAKE 1 TABLET BY MOUTH EVERY DAY FOR CHOLESTEROL 90 tablet 0  . valACYclovir (VALTREX) 500 MG tablet Take  1 tablet  Daily  to Prevent Fever Blisters 90 tablet 1   No current facility-administered medications for this visit.     Past Medical History:  Diagnosis Date  . Anxiety   . Coronary artery disease   . Hyperlipidemia   . Hypertension   . ICH (intracerebral hemorrhage) ([place])   . Other testicular hypofunction   . Permanent atrial fibrillation ([place])   . Prostate cancer ([place])   . Vitamin D deficiency     Past Surgical History:  Procedure Laterality Date  . COLONOSCOPY    . LYMPHADENECTOMY Bilateral 06/23/2012   Procedure: LYMPHADENECTOMY;  Surgeon: Dutch Gray, MD;  Location: [facility];  Service: Urology;  Laterality: Bilateral;  . ROBOT ASSISTED LAPAROSCOPIC RADICAL PROSTATECTOMY N/A 06/23/2012   Procedure: ROBOTIC ASSISTED LAPAROSCOPIC RADICAL PROSTATECTOMY LEVEL 2;  Surgeon: Dutch Gray, MD;  Location: [facility];  Service: Urology;  Laterality: N/A;    Social History   Socioeconomic History  . Marital status: Married    Spouse name: Not on file  . Number of children: 3  . Years of education:  Not on file  . Highest education level: Not on file  Occupational History    Comment: Sales  Tobacco Use  . Smoking status: Never Smoker  . Smokeless tobacco: Never Used  Substance and Sexual Activity  . Alcohol use: Yes    Alcohol/week: 5.0 standard drinks    Types: 5 Cans of beer per week    Comment: 2-3 beers per day  . Drug use: No  . Sexual activity: Not on file  Other Topics Concern  . Not on file  Social History Narrative  . Not on file   Social Determinants of Health   Financial Resource Strain: Not on file  Food Insecurity: Not on file  Transportation Needs: Not on file  Physical  Activity: Not on file  Stress: Not on file  Social Connections: Not on file  Intimate Partner Violence: Not on file    Family History  Problem Relation Age of Onset  . Colon cancer Father   . Esophageal cancer Neg Hx   . Rectal cancer Neg Hx   . Stomach cancer Neg Hx     ROS: no fevers or chills, productive cough, hemoptysis, dysphasia, odynophagia, melena, hematochezia, dysuria, hematuria, rash, seizure activity, orthopnea, PND, pedal edema, claudication. Remaining systems are negative.  Physical Exam: Well-developed well-nourished in no acute distress.  Skin is warm and dry.  HEENT is normal.  Neck is supple.  Chest is clear to auscultation with normal expansion.  Cardiovascular exam is irregular Abdominal exam nontender or distended. No masses palpated. Extremities show no edema. neuro grossly intact  A/P  1 permanent atrial fibrillation-patient continues to do well from a symptomatic standpoint.  Continue present medications for rate control.  Continue apixaban.  2 coronary artery disease-no chest pain.  Plan to continue medical therapy.  Continue statin.  No aspirin given need for apixaban.  3 hypertension-blood pressure controlled.  Continue present medical regimen and follow.  4 dilated aortic root-patient will need follow-up CTA April 2022.  5 hyperlipidemia-continue Zocor and Zetia.  He did not tolerate Lipitor previously.  6 aortic insufficiency-mild on previous echo.  Kirk Ruths, MD

## 2020-05-13 ENCOUNTER — Other Ambulatory Visit: Payer: Self-pay | Admitting: Internal Medicine

## 2020-05-13 MED ORDER — VALACYCLOVIR HCL 500 MG PO TABS: ORAL_TABLET | ORAL | 1 refills | Status: DC

## 2020-05-14 ENCOUNTER — Encounter: Payer: Self-pay | Admitting: Cardiology

## 2020-05-14 ENCOUNTER — Other Ambulatory Visit: Payer: Self-pay

## 2020-05-14 ENCOUNTER — Ambulatory Visit (INDEPENDENT_AMBULATORY_CARE_PROVIDER_SITE_OTHER): Payer: Medicare Other | Admitting: Cardiology

## 2020-05-14 VITALS — BP 118/78 | HR 61 | Ht 78.0 in | Wt 230.0 lb

## 2020-05-14 DIAGNOSIS — I4821 Permanent atrial fibrillation: Secondary | ICD-10-CM | POA: Diagnosis not present

## 2020-05-14 DIAGNOSIS — I712 Thoracic aortic aneurysm, without rupture, unspecified: Secondary | ICD-10-CM

## 2020-05-14 DIAGNOSIS — I2510 Atherosclerotic heart disease of native coronary artery without angina pectoris: Secondary | ICD-10-CM | POA: Diagnosis not present

## 2020-05-14 LAB — BASIC METABOLIC PANEL
BUN/Creatinine Ratio: 12 (ref 10–24)
BUN: 16 mg/dL (ref 8–27)
CO2: 24 mmol/L (ref 20–29)
Calcium: 9.3 mg/dL (ref 8.6–10.2)
Chloride: 102 mmol/L (ref 96–106)
Creatinine, Ser: 1.35 mg/dL — ABNORMAL HIGH (ref 0.76–1.27)
Glucose: 96 mg/dL (ref 65–99)
Potassium: 4.6 mmol/L (ref 3.5–5.2)
Sodium: 140 mmol/L (ref 134–144)
eGFR: 55 mL/min/{1.73_m2} — ABNORMAL LOW (ref >59)

## 2020-05-14 NOTE — Patient Instructions (Signed)
  Testing/Procedures:  CTA OF THE CHEST TO FOLLOW UP ON THORACIC ANEURYSM AT [facility] [place] [address]-SCHEDULE IN APRIL   Follow-Up: At [facility], you and your health needs are our priority.  As part of our continuing mission to provide you with exceptional heart care, we have created designated Provider Care Teams.  These Care Teams include your primary Cardiologist (physician) and Advanced Practice Providers (APPs -  Physician Assistants and Nurse Practitioners) who all work together to provide you with the care you need, when you need it.  We recommend signing up for the patient portal called "MyChart".  Sign up information is provided on this After Visit Summary.  MyChart is used to connect with patients for Virtual Visits (Telemedicine).  Patients are able to view lab/test results, encounter notes, upcoming appointments, etc.  Non-urgent messages can be sent to your provider as well.   To learn more about what you can do with MyChart, go to [email or web address].    Your next appointment:   12 month(s)  The format for your next appointment:   In Person  Provider:   Kirk Ruths, MD

## 2020-05-20 ENCOUNTER — Encounter: Payer: Medicare Other | Admitting: Internal Medicine

## 2020-05-27 ENCOUNTER — Other Ambulatory Visit: Payer: Self-pay

## 2020-05-27 ENCOUNTER — Ambulatory Visit
Admission: RE | Admit: 2020-05-27 | Discharge: 2020-05-27 | Disposition: A | Discharge status: Home / Self Care | Payer: Medicare Other | Source: Ambulatory Visit | Attending: Cardiology | Admitting: Cardiology

## 2020-05-27 DIAGNOSIS — I712 Thoracic aortic aneurysm, without rupture, unspecified: Secondary | ICD-10-CM

## 2020-05-27 DIAGNOSIS — I700 Atherosclerosis of aorta: Secondary | ICD-10-CM | POA: Diagnosis not present

## 2020-05-27 DIAGNOSIS — I2510 Atherosclerotic heart disease of native coronary artery without angina pectoris: Secondary | ICD-10-CM | POA: Diagnosis not present

## 2020-05-27 DIAGNOSIS — K7689 Other specified diseases of liver: Secondary | ICD-10-CM | POA: Diagnosis not present

## 2020-05-27 MED ORDER — IOPAMIDOL (ISOVUE-370) INJECTION 76%
75.0000 mL | Freq: Once | INTRAVENOUS | Status: AC | PRN
  Administered 2020-05-27: 75 mL via INTRAVENOUS

## 2020-06-01 DIAGNOSIS — Z23 Encounter for immunization: Secondary | ICD-10-CM | POA: Diagnosis not present

## 2020-07-18 ENCOUNTER — Encounter: Payer: Self-pay | Admitting: Internal Medicine

## 2020-07-18 ENCOUNTER — Other Ambulatory Visit: Payer: Self-pay

## 2020-07-18 ENCOUNTER — Ambulatory Visit (INDEPENDENT_AMBULATORY_CARE_PROVIDER_SITE_OTHER): Payer: Medicare Other | Admitting: Internal Medicine

## 2020-07-18 VITALS — BP 108/74 | HR 64 | Temp 97.3°F | Resp 16 | Ht 77.0 in | Wt 232.6 lb

## 2020-07-18 DIAGNOSIS — Z125 Encounter for screening for malignant neoplasm of prostate: Secondary | ICD-10-CM

## 2020-07-18 DIAGNOSIS — Z136 Encounter for screening for cardiovascular disorders: Secondary | ICD-10-CM | POA: Diagnosis not present

## 2020-07-18 DIAGNOSIS — R7309 Other abnormal glucose: Secondary | ICD-10-CM

## 2020-07-18 DIAGNOSIS — I10 Essential (primary) hypertension: Secondary | ICD-10-CM

## 2020-07-18 DIAGNOSIS — Z79899 Other long term (current) drug therapy: Secondary | ICD-10-CM

## 2020-07-18 DIAGNOSIS — E790 Hyperuricemia without signs of inflammatory arthritis and tophaceous disease: Secondary | ICD-10-CM

## 2020-07-18 DIAGNOSIS — I712 Thoracic aortic aneurysm, without rupture, unspecified: Secondary | ICD-10-CM

## 2020-07-18 DIAGNOSIS — I2510 Atherosclerotic heart disease of native coronary artery without angina pectoris: Secondary | ICD-10-CM | POA: Diagnosis not present

## 2020-07-18 DIAGNOSIS — Z8546 Personal history of malignant neoplasm of prostate: Secondary | ICD-10-CM | POA: Diagnosis not present

## 2020-07-18 DIAGNOSIS — Z1212 Encounter for screening for malignant neoplasm of rectum: Secondary | ICD-10-CM

## 2020-07-18 DIAGNOSIS — I482 Chronic atrial fibrillation, unspecified: Secondary | ICD-10-CM

## 2020-07-18 DIAGNOSIS — N1831 Chronic kidney disease, stage 3a: Secondary | ICD-10-CM

## 2020-07-18 DIAGNOSIS — E559 Vitamin D deficiency, unspecified: Secondary | ICD-10-CM | POA: Diagnosis not present

## 2020-07-18 DIAGNOSIS — Z1211 Encounter for screening for malignant neoplasm of colon: Secondary | ICD-10-CM

## 2020-07-18 DIAGNOSIS — E782 Mixed hyperlipidemia: Secondary | ICD-10-CM

## 2020-07-18 NOTE — Progress Notes (Signed)
AortaScan < 3 cm. Within normal limits, per Dr McKeown. 

## 2020-07-18 NOTE — Patient Instructions (Signed)

## 2020-07-18 NOTE — Progress Notes (Signed)
Comprehensive Evaluation & Examination  Future Appointments  Date Time Provider [place]  07/18/2020  2:00 PM Unk Pinto, MD [facility] None  08/27/2020 11:30 AM Liane Comber, NP [facility] None  07/22/2021  3:00 PM Unk Pinto, MD [facility] None            This very nice 75 y.o.  MWM  presents for a  comprehensive evaluation and management of multiple medical co-morbidities.  Patient has been followed for HTN, ASHD /cAfib, HLD, Prediabetes and Vitamin D Deficiency.       HTN predates since  2006.  In 2007, patient had a Negative Cardiolite. Patient's BP has been controlled at home.  Today's BP is at goal - 108/74. Patient was hospitalized in 1988 with a Philo & recovered w/o sequela.  Patient has been on Eliquis & Flecainide since Jan 2018, when he was dx'd with pAfib/Flutter & in Mar 2018, he underwent CV  by Dr Wyline Copas [facility]) .   Heart cath in 2018 again showed Nl Coronary Aa.  Patient has mild Ao /dilated Ao root & Mitral Insufficiency by echo and patient is followed by Dr [name].  Patient also  has CKD3a (GFR 52) attributed to his HTN. Patient denies any cardiac symptoms as chest pain, palpitations, shortness of breath, dizziness or ankle swelling.       Patient's hyperlipidemia is controlled with diet and Simvastatin /Ezetimibe. Patient denies myalgias or other medication SE's. Last lipids were at goal:  Lab Results  Component Value Date   CHOL 140 04/09/2020   HDL 76 04/09/2020   LDLCALC 51 04/09/2020   TRIG 46 04/09/2020   CHOLHDL 1.8 04/09/2020         Patient is monitored expectantly for glucose intolerance and patient denies reactive hypoglycemic symptoms, visual blurring, diabetic polys or paresthesias. Last A1c was normal & at goal:   Lab Results  Component Value Date   HGBA1C 5.0 12/12/2019          Finally, patient has history of Vitamin D Deficiency ("24" /2008) and last vitamin D was at goal:   Lab Results  Component Value  Date   VD25OH 61 12/12/2019     Current Outpatient Medications on File Prior to Visit  Medication Sig  . ALPRAZolam (XANAX) 1 MG tablet Take 1/2 - 1 tablet at Bedtime     ONLY if needed  . ELIQUIS 5 MG TABS tablet Take 1 tablet  2  times daily.  Marland Kitchen atenolol ( 25 MG tablet Take 1 tablet  daily.  Marland Kitchen VITAMIN D 5000 UNITS  Take  daily.  . diphenhydrAMINE  25 mg capsule Take  at bedtime as needed for sleep.  Marland Kitchen ezetimibe 10 MG tablet TAKE 1 TABLET DAILY  . MOVE FREE JOINT HEALTH  TABS Take 1 tablet  every morning.  Marland Kitchen glucosamine-chondroitin 500-400 MG tablet Take 1 tablet daily.  . hydrochlorothiazide  25 MG tablet Take 1 tablet  daily.  . Ibuprofen 200 MG tablet Take 1 daily  . losartan  100 MG tablet TAKE 1 TABLET  DAILY   . simvastatin  40 MG tablet Take  1 tablet  Daily  for Cholesterol    . valACYclovir 500 MG tablet Take  1 tablet  Daily  to Prevent Fever Blisters     Allergies  Allergen Reactions  . Ultram [Tramadol] Anaphylaxis  . Atorvastatin Myalgias (intolerance)  . Doxazosin Dizziness   . Telmisartan Hypertension     Past Medical History:  Diagnosis  Date  . Anxiety   . Coronary artery disease   . Hyperlipidemia   . Hypertension   . ICH (intracerebral hemorrhage) ([place])   . Other testicular hypofunction   . Permanent atrial fibrillation ([place])   . Prostate cancer ([place])   . Vitamin D deficiency      Health Maintenance  Topic Date Due  . COVID-19 Vaccine (4 - Booster for Pfizer series) 01/26/2020  . INFLUENZA VACCINE  09/23/2020  . COLONOSCOPY  03/17/2022  . Hepatitis C Screening  Completed  . PNA vac Low Risk Adult  Completed  . HPV VACCINES  Aged Out  . TETANUS/TDAP  Discontinued     Immunization History  Administered Date(s) Administered  . Fluad Quad(high Dose 65+) 12/05/2019  . Influenza, High Dose Seasonal PF 01/31/2015, 12/03/2017  . Influenza-Unspecified 12/19/2015, 12/24/2016  . MMR 02/23/2005  . PFIZER(Purple Top)SARS-COV-2 Vaccination  03/19/2019, 04/11/2019, 10/27/2019  . Pneumococcal Conjugate-13 01/31/2015  . Pneumococcal Polysaccharide-23 02/23/2010, 03/30/2016  . Tdap 02/23/2005    Last Colon -  01.23.2014 - Dr Deatra Ina - Normal - Recc 10 yr f/u due Jan /Feb 2024  Past Surgical History:  Procedure Laterality Date  . COLONOSCOPY    . LYMPHADENECTOMY Bilateral 06/23/2012   Procedure: LYMPHADENECTOMY;  Surgeon: Dutch Gray, MD;  Location: [facility];  Service: Urology;  Laterality: Bilateral;  . ROBOT ASSISTED LAPAROSCOPIC RADICAL PROSTATECTOMY N/A 06/23/2012   Procedure: ROBOTIC ASSISTED LAPAROSCOPIC RADICAL PROSTATECTOMY LEVEL 2;  Surgeon: Dutch Gray, MD;  Location: [facility];  Service: Urology;  Laterality: N/A;     Family History  Problem Relation Age of Onset  . Colon cancer Father   . Esophageal cancer Neg Hx   . Rectal cancer Neg Hx   . Stomach cancer Neg Hx     Social History   Socioeconomic History  . Marital status: Married    Spouse name: Not on file  . Number of children: 3  . Years of education: Not on file  . Highest education level: Not on file  Occupational History    Comment: Sales  Tobacco Use  . Smoking status: Never Smoker  . Smokeless tobacco: Never Used  Substance and Sexual Activity  . Alcohol use: Yes    Alcohol/week: 5.0 standard drinks    Types: 5 Cans of beer per week    Comment: 2-3 beers per day  . Drug use: No  . Sexual activity: Not on file     ROS Constitutional: Denies fever, chills, weight loss/gain, headaches, insomnia,  night sweats or change in appetite. Does c/o fatigue. Eyes: Denies redness, blurred vision, diplopia, discharge, itchy or watery eyes.  ENT: Denies discharge, congestion, post nasal drip, epistaxis, sore throat, earache, hearing loss, dental pain, Tinnitus, Vertigo, Sinus pain or snoring.  Cardio: Denies chest pain, palpitations, irregular heartbeat, syncope, dyspnea, diaphoresis, orthopnea, PND, claudication or edema Respiratory: denies cough, dyspnea,  DOE, pleurisy, hoarseness, laryngitis or wheezing.  Gastrointestinal: Denies dysphagia, heartburn, reflux, water brash, pain, cramps, nausea, vomiting, bloating, diarrhea, constipation, hematemesis, melena, hematochezia, jaundice or hemorrhoids Genitourinary: Denies dysuria, frequency, urgency, nocturia, hesitancy, discharge, hematuria or flank pain Musculoskeletal: Denies arthralgia, myalgia, stiffness, Jt. Swelling, pain, limp or strain/sprain. Denies Falls. Skin: Denies puritis, rash, hives, warts, acne, eczema or change in skin lesion Neuro: No weakness, tremor, incoordination, spasms, paresthesia or pain Psychiatric: Denies confusion, memory loss or sensory loss. Denies Depression. Endocrine: Denies change in weight, skin, hair change, nocturia, and paresthesia, diabetic polys, visual blurring or hyper / hypo glycemic episodes.  Heme/Lymph:  No excessive bleeding, bruising or enlarged lymph nodes.   Physical Exam  BP 108/74   Pulse 64   Temp (!) 97.3 F (36.3 C)   Resp 16   Ht 6' 5"  (1.956 m)   Wt 232 lb 9.6 oz (105.5 kg)   SpO2 99%   BMI 27.58 kg/m   General Appearance: Well nourished and well groomed and in no apparent distress.  Eyes: PERRLA, EOMs, conjunctiva no swelling or erythema, normal fundi and vessels. Sinuses: No frontal/maxillary tenderness ENT/Mouth: EACs patent / TMs  nl. Nares clear without erythema, swelling, mucoid exudates. Oral hygiene is good. No erythema, swelling, or exudate. Tongue normal, non-obstructing. Tonsils not swollen or erythematous. Hearing normal.  Neck: Supple, thyroid not palpable. No bruits, nodes or JVD. Respiratory: Respiratory effort normal.  BS equal and clear bilateral without rales, rhonci, wheezing or stridor. Cardio: Heart sounds are normal with regular rate and rhythm and no murmurs, rubs or gallops. Peripheral pulses are normal and equal bilaterally without edema. No aortic or femoral bruits. Chest: symmetric with normal excursions  and percussion.  Abdomen: Soft, with Nl bowel sounds. Nontender, no guarding, rebound, hernias, masses, or organomegaly.  Lymphatics: Non tender without lymphadenopathy.  Musculoskeletal: Full ROM all peripheral extremities, joint stability, 5/5 strength, and normal gait. Skin: Warm and dry without rashes, lesions, cyanosis, clubbing or  ecchymosis.  Neuro: Cranial nerves intact, reflexes equal bilaterally. Normal muscle tone, no cerebellar symptoms. Sensation intact.  Pysch: Alert and oriented X 3 with normal affect, insight and judgment appropriate.   Assessment and Plan  1. Essential hypertension  - EKG 12-Lead - [place], RETROPERITNL ABD,  LTD - Urinalysis, Routine w reflex microscopic - Microalbumin / creatinine urine ratio - CBC with Differential/Platelet - COMPLETE METABOLIC PANEL WITH GFR - Magnesium - TSH  2. Hyperlipidemia, mixed  - EKG 12-Lead - [place], RETROPERITNL ABD,  LTD - Lipid panel - TSH  3. Abnormal glucose  - EKG 12-Lead - [place], RETROPERITNL ABD,  LTD - Hemoglobin A1c - Insulin, random  4. Vitamin D deficiency  - VITAMIN D 25 Hydroxy  5. Coronary artery disease involving native coronary artery  of native heart without angina pectoris  - EKG 12-Lead - Lipid panel  6. Chronic atrial fibrillation (HCC)  - EKG 12-Lead - TSH  7. Stage 3a chronic kidney disease (HCC)  - COMPLETE METABOLIC PANEL WITH GFR  8. Thoracic aortic aneurysm without rupture (HCC)  - Lipid panel  9. Hyperuricemia  - Uric acid  10. History of prostate cancer  - PSA  11. Prostate cancer screening  - PSA  12. Screening for colorectal cancer  - POC Hemoccult Bld/Stl   13. Screening for ischemic heart disease  - EKG 12-Lead  14. Screening for AAA (aortic abdominal aneurysm)  - [place], RETROPERITNL ABD,  LTD  15. Medication management  - Urinalysis, Routine w reflex microscopic - Microalbumin / creatinine urine ratio - Uric acid - CBC with Differential/Platelet -  COMPLETE METABOLIC PANEL WITH GFR - Magnesium - Lipid panel - TSH - Hemoglobin A1c - Insulin, random - VITAMIN D 25 Hydrox          Patient was counseled in prudent diet, weight control to achieve/maintain BMI less than 25, BP monitoring, regular exercise and medications as discussed.  Discussed med effects and SE's. Routine screening labs and tests as requested with regular follow-up as recommended. Over 40 minutes of exam, counseling, chart review and high complex critical decision making was performed   Kirtland Bouchard,  MD

## 2020-07-19 LAB — URINALYSIS, ROUTINE W REFLEX MICROSCOPIC
Bilirubin Urine: NEGATIVE
Glucose, UA: NEGATIVE
Hgb urine dipstick: NEGATIVE
Ketones, ur: NEGATIVE
Leukocytes,Ua: NEGATIVE
Nitrite: NEGATIVE
Protein, ur: NEGATIVE
Specific Gravity, Urine: 1.008 (ref 1.001–1.035)
pH: 6 (ref 5.0–8.0)

## 2020-07-19 LAB — COMPLETE METABOLIC PANEL WITH GFR
AG Ratio: 1.6 (calc) (ref 1.0–2.5)
ALT: 22 U/L (ref 9–46)
AST: 30 U/L (ref 10–35)
Albumin: 4.3 g/dL (ref 3.6–5.1)
Alkaline phosphatase (APISO): 59 U/L (ref 35–144)
BUN/Creatinine Ratio: 15 (calc) (ref 6–22)
BUN: 19 mg/dL (ref 7–25)
CO2: 29 mmol/L (ref 20–32)
Calcium: 9.6 mg/dL (ref 8.6–10.3)
Chloride: 102 mmol/L (ref 98–110)
Creat: 1.23 mg/dL — ABNORMAL HIGH (ref 0.70–1.18)
GFR, Est African American: 66 mL/min/{1.73_m2} (ref >=60)
GFR, Est Non African American: 57 mL/min/{1.73_m2} — ABNORMAL LOW (ref >=60)
Globulin: 2.7 g/dL (calc) (ref 1.9–3.7)
Glucose, Bld: 105 mg/dL — ABNORMAL HIGH (ref 65–99)
Potassium: 4.3 mmol/L (ref 3.5–5.3)
Sodium: 139 mmol/L (ref 135–146)
Total Bilirubin: 2.4 mg/dL — ABNORMAL HIGH (ref 0.2–1.2)
Total Protein: 7 g/dL (ref 6.1–8.1)

## 2020-07-19 LAB — MICROALBUMIN / CREATININE URINE RATIO
Creatinine, Urine: 59 mg/dL (ref 20–320)
Microalb Creat Ratio: 59 mcg/mg creat — ABNORMAL HIGH (ref <30)
Microalb, Ur: 3.5 mg/dL

## 2020-07-19 LAB — CBC WITH DIFFERENTIAL/PLATELET
Absolute Monocytes: 494 cells/uL (ref 200–950)
Basophils Absolute: 83 cells/uL (ref 0–200)
Basophils Relative: 1.6 %
Eosinophils Absolute: 182 cells/uL (ref 15–500)
Eosinophils Relative: 3.5 %
HCT: 42.3 % (ref 38.5–50.0)
Hemoglobin: 14.5 g/dL (ref 13.2–17.1)
Lymphs Abs: 1227 cells/uL (ref 850–3900)
MCH: 34.4 pg — ABNORMAL HIGH (ref 27.0–33.0)
MCHC: 34.3 g/dL (ref 32.0–36.0)
MCV: 100.2 fL — ABNORMAL HIGH (ref 80.0–100.0)
MPV: 9.8 fL (ref 7.5–12.5)
Monocytes Relative: 9.5 %
Neutro Abs: 3214 cells/uL (ref 1500–7800)
Neutrophils Relative %: 61.8 %
Platelets: 184 10*3/uL (ref 140–400)
RBC: 4.22 10*6/uL (ref 4.20–5.80)
RDW: 12 % (ref 11.0–15.0)
Total Lymphocyte: 23.6 %
WBC: 5.2 10*3/uL (ref 3.8–10.8)

## 2020-07-19 LAB — HEMOGLOBIN A1C
Hgb A1c MFr Bld: 5 % of total Hgb (ref <5.7)
Mean Plasma Glucose: 97 mg/dL
eAG (mmol/L): 5.4 mmol/L

## 2020-07-19 LAB — LIPID PANEL
Cholesterol: 165 mg/dL (ref <200)
HDL: 81 mg/dL (ref >=40)
LDL Cholesterol (Calc): 70 mg/dL (calc)
Non-HDL Cholesterol (Calc): 84 mg/dL (calc) (ref <130)
Total CHOL/HDL Ratio: 2 (calc) (ref <5.0)
Triglycerides: 68 mg/dL (ref <150)

## 2020-07-19 LAB — INSULIN, RANDOM: Insulin: 10 u[IU]/mL

## 2020-07-19 LAB — TSH: TSH: 1.58 mIU/L (ref 0.40–4.50)

## 2020-07-19 LAB — MAGNESIUM: Magnesium: 1.9 mg/dL (ref 1.5–2.5)

## 2020-07-19 LAB — PSA: PSA: <0.04 ng/mL (ref <=4.00)

## 2020-07-19 LAB — VITAMIN D 25 HYDROXY (VIT D DEFICIENCY, FRACTURES): Vit D, 25-Hydroxy: 65 ng/mL (ref 30–100)

## 2020-07-19 LAB — URIC ACID: Uric Acid, Serum: 4.7 mg/dL (ref 4.0–8.0)

## 2020-07-20 NOTE — Progress Notes (Signed)
============================================================ -   Test results slightly outside the reference range are not unusual. If there is anything important, I will review this with you,  otherwise it is considered normal test values.  If you have further questions,  please do not hesitate to contact me at the office or via My Chart.  ============================================================ ============================================================  -  PSA - still undetectable - Great ! ============================================================ ============================================================  - Uric Acid / Gout test is Normal & OK  ============================================================ ============================================================  - Kidney functions still Stage 3a and stable at GFR 57 - actually is better  ============================================================ ============================================================  - Total Chol = 165       and       LDL Chol = 70 - Both Excellent  ! ============================================================ ============================================================  - A1c - Normal - Great - No Diabetes  ! ============================================================ ============================================================  - Vitamin D = 65 -  Excellent  !   - Please keep dose same  ============================================================ ============================================================  - All Else - CBC - Kidneys - Electrolytes - Liver - Magnesium & Thyroid    - all  Normal / OK ============================================================ ============================================================

## 2020-07-26 ENCOUNTER — Telehealth: Payer: Self-pay | Admitting: Cardiology

## 2020-07-26 NOTE — Telephone Encounter (Signed)
Pt c/o medication issue:  1. Name of Medication: Paxlovid  2. How are you currently taking this medication (dosage and times per day)?  Patient has not started this medication  3. Are you having a reaction (difficulty breathing--STAT)? No   4. What is your medication issue?   Patient's wife states she and the patient have been around their daughter's family the past few days and they recently tested positive for COVID. The patient is still not confirmed positive, but she would like to know if he is eligible for Paxlovid incase he does test positive. She would like to ensure that this will not interfere with any of his cardiac medications, specifically Eliquis. Please advise.

## 2020-07-26 NOTE — Telephone Encounter (Signed)
He will be fine to take the Paxlovid.

## 2020-07-26 NOTE — Telephone Encounter (Signed)
Spoke to patient's wife Kristin's advice given.

## 2020-07-26 NOTE — Telephone Encounter (Signed)
Spoke to patient's wife she stated husband has covid.She wanted to know if ok to take Paxlovid with Eliquis.Advised I will send message to our pharmacist for advice.

## 2020-08-21 DIAGNOSIS — C61 Malignant neoplasm of prostate: Secondary | ICD-10-CM | POA: Diagnosis not present

## 2020-08-21 DIAGNOSIS — N5201 Erectile dysfunction due to arterial insufficiency: Secondary | ICD-10-CM | POA: Diagnosis not present

## 2020-08-27 ENCOUNTER — Ambulatory Visit: Payer: Medicare Other | Admitting: Adult Health

## 2020-08-31 ENCOUNTER — Other Ambulatory Visit: Payer: Self-pay | Admitting: Internal Medicine

## 2020-08-31 MED ORDER — SIMVASTATIN 40 MG PO TABS: ORAL_TABLET | ORAL | 3 refills | Status: DC

## 2020-09-04 NOTE — Progress Notes (Signed)
MEDICARE ANNUAL WELLNESS VISIT AND FOLLOW UP Assessment:   Diagnoses and all orders for this visit:  Annual Medicare Wellness Visit Due annually  Health maintenance reviewed Check with insurance about shingrix Discussed TD boost PRN  Paroxysmal atrial fibrillation ([place]) Controlled on atenolol,  Has stopped flecainide On elequis Followed by cardiology  Essential hypertension At goal; continue medications Monitor blood pressure at home; call if consistently over 130/80 Continue DASH diet.   Reminder to go to the ER if any CP, SOB, nausea, dizziness, severe HA, changes vision/speech, left arm numbness and tingling and jaw pain.  Atrial flutter, unspecified type ([place]) Rate controlled, followed by cardiology Continue BB and eliquis   Prostate cancer [facility]) S/p prostatectomy in 2014; followed by Dr. [name]  Vitamin D deficiency At goal at recent check; continue to recommend supplementation for goal of 60-100 Defer vitamin D level  Medication management Monitor CBC, CMP, magnesium at routine OVs  Mixed hyperlipidemia Continue medications Continue low cholesterol diet and exercise.  Monitor lipid panel annually and PRN  Glaucoma suspect, bilateral Followed by [name]  BMI 27 Continue to recommend diet heavy in fruits and veggies and low in animal meats, cheeses, and dairy products, appropriate calorie intake Discuss exercise recommendations routinely Continue to monitor weight at each visit  Anxiety Uses xanax sporadically PRN; discssed risks and agreeable to d/c He will try benadryl; consider trazodone if needed Stress management techniques discussed, increase water, good sleep hygiene discussed, increase exercise, and increase veggies.   Abnormal glucose Recent A1Cs at goal, defer check today Discussed diet/exercise, weight management   CKD (chronic kidney disease) stage 3, GFR 30-59 ml/min (HCC) Increase fluids, avoid NSAIDS, monitor sugars, will  monitor  Cold sores Valtrex PRN flare  Over 30 minutes of face to face exam, counseling, chart review, and critical decision making was performed.  Future Appointments  Date Time Provider [place]  07/22/2021  3:00 PM Unk Pinto, MD [facility] None  09/05/2021 11:30 AM Liane Comber, NP [facility] None     Plan:   During the course of the visit the patient was educated and counseled about appropriate screening and preventive services including:   Pneumococcal vaccine  Influenza vaccine Prevnar 13 Td vaccine Screening electrocardiogram Colorectal cancer screening Diabetes screening Glaucoma screening Nutrition counseling    Subjective:  Kenneth Russell is a 75 y.o. male who presents for Medicare Annual Wellness Visit and 3 month follow up for HTN, HLDhyperlipidemia, glucose management, and vitamin D Def.   Patient is S/p Radical Prostatectomy in 2014 by Dr. [name], follows annually without recurrent concerns.   He has recurrent mouth sores, takes valtrex PRN for flares.   Anxiety, xanax 1 mg, takes rarely at night for sleep, 30 tabs last almost a full year. He also takes benadryl which does work well. Discussed risks with xanax and he is receptive to discontinuing.   BMI is Body mass index is 27.27 kg/m., he has been working on diet and exercise. Very active, exercises at least 60 min daily, cycling, weights, tennis, golf.  Wt Readings from Last 3 Encounters:  09/05/20 230 lb (104.3 kg)  07/18/20 232 lb 9.6 oz (105.5 kg)  05/14/20 230 lb (104.3 kg)   He had a Negative Cardiolite in 2007. In Jan 2018, he had a negative heart cath evaluating new onset pAfib/Flutter. He had several CV however with recurrence, now on atenolol and eliquis. Patient denies any cardiac symptoms. He golfs or exercises daily. Follows with Dr. [name] annually.  His blood pressure has been controlled at home, today their BP is BP: 118/74 He does workout. He denies chest pain,  shortness of breath, dizziness.   He is on cholesterol medication (simvastatin 40 mg daily) and denies myalgias. His cholesterol is at goal. The cholesterol last visit was:   Lab Results  Component Value Date   CHOL 165 07/18/2020   HDL 81 07/18/2020   LDLCALC 70 07/18/2020   TRIG 68 07/18/2020   CHOLHDL 2.0 07/18/2020   He has been working on diet and exercise for glucose management, and denies increased appetite, nausea, paresthesia of the feet, polydipsia, polyuria and visual disturbances. Last A1C in the office was:  Lab Results  Component Value Date   HGBA1C 5.0 07/18/2020   Last GFR, has CKD3a monitored and last OV: Lab Results  Component Value Date   GFRNONAA 57 (L) 07/18/2020   GFRNONAA 51 (L) 04/09/2020   GFRNONAA 53 (L) 12/12/2019   Patient is on Vitamin D supplement.   Lab Results  Component Value Date   VD25OH 76 07/18/2020     He has no overt anemia but note he was hyperchromic/macrocytic on recent CBC;  Lab Results  Component Value Date   WBC 5.2 07/18/2020   HGB 14.5 07/18/2020   HCT 42.3 07/18/2020   MCV 100.2 (H) 07/18/2020   MCH 34.4 (H) 07/18/2020   RDW 12.0 07/18/2020   PLT 184 07/18/2020   Hx of borderline def, discussed and plan to start SL supplement and recheck at follow up Lab Results  Component Value Date   VITAMINB12 380 08/29/2013     Medication Review:   Current Outpatient Medications (Cardiovascular):    atenolol (TENORMIN) 25 MG tablet, Take 1 tablet (25 mg total) by mouth daily.   ezetimibe (ZETIA) 10 MG tablet, TAKE 1 TABLET BY MOUTH DAILY   hydrochlorothiazide (HYDRODIURIL) 25 MG tablet, Take 1 tablet (25 mg total) by mouth daily.   losartan (COZAAR) 100 MG tablet, TAKE 1 TABLET BY MOUTH DAILY FOR BLOOD PRESSURE   simvastatin (ZOCOR) 40 MG tablet, Take  1 tablet  at Bedtime  for Cholesterol  /Patient knows to take by mouth  Current Outpatient Medications (Respiratory):    diphenhydrAMINE (BENADRYL) 25 mg capsule, Take 25 mg by  mouth at bedtime as needed for sleep.  Current Outpatient Medications (Analgesics):    ibuprofen (ADVIL) 200 MG tablet, Take by mouth.  Current Outpatient Medications (Hematological):    apixaban (ELIQUIS) 5 MG TABS tablet, Take 1 tablet (5 mg total) by mouth 2 (two) times daily.  Current Outpatient Medications (Other):    Cholecalciferol (VITAMIN D3) 5000 UNITS CAPS, Take 5,000 Int'l Units by mouth daily.   Glucos-Chond-Hyal Ac-Ca Fructo (MOVE FREE JOINT HEALTH ADVANCE) TABS, Take 1 tablet by mouth every morning.   valACYclovir (VALTREX) 500 MG tablet, Take  1 tablet  Daily  to Prevent Fever Blisters  Allergies: Allergies  Allergen Reactions   Ultram [Tramadol] Anaphylaxis   Atorvastatin     Other reaction(s): Myalgias (intolerance)   Doxazosin Other (See Comments)    Dizziness    Telmisartan Hypertension and Other (See Comments)    Current Problems (verified) has Hyperlipidemia, LDL goal <50; Hypertension; Anxiety; Vitamin D deficiency; Prostate cancer ([place]); Medication management; Abnormal glucose; Overweight (BMI 25.0-29.9); Atrial flutter ([place]); Glaucoma suspect, bilateral; Paroxysmal atrial fibrillation (HCC); CKD (chronic kidney disease) stage 3, GFR 30-59 ml/min ([place]); Tortuous aorta ([place]); Thoracic aortic aneurysm without rupture ([place]); Coronary artery disease involving native coronary artery of native  heart without angina pectoris; and B12 deficiency on their problem list.  Screening Tests Immunization History  Administered Date(s) Administered   Fluad Quad(high Dose 65+) 12/05/2019   Influenza, High Dose Seasonal PF 01/31/2015, 12/03/2017   Influenza-Unspecified 12/19/2015, 12/24/2016   MMR 02/23/2005   PFIZER(Purple Top)SARS-COV-2 Vaccination 03/19/2019, 04/11/2019, 10/27/2019   Pneumococcal Conjugate-13 01/31/2015   Pneumococcal Polysaccharide-23 02/23/2010, 03/30/2016   Tdap 02/23/2005    Preventative care: Last colonoscopy: 2014 due 2024  Prior  vaccinations: TD or Tdap: 2007 DUE, get PRN due to cost Influenza: 2021 Pneumococcal: 2018 Prevnar13: 2016 Shingles/Zostavax: check with insurance  Covid 19: 2/2, pfizer + booster   Names of Other Physician/Practitioners you currently use: 1. [facility] here for primary care 2. [name], eye doctor, last visit 2022, monitoring glaucoma, goes annually, stable  3. Dr. Gloriann Loan, dentist, last visit, last visit 08/2020, goes annually   Patient Care Team: Unk Pinto, MD as PCP - General (Internal Medicine) [name] Denice Bors, MD as PCP - Cardiology (Cardiology) Raynelle Bring, MD as Consulting Physician (Urology) Inda Castle, MD (Inactive) as Consulting Physician (Gastroenterology) Susa Day, MD as Consulting Physician (Orthopedic Surgery) Jarome Matin, MD as Consulting Physician (Dermatology) Bond, Tracie Harrier, MD as Referring Physician (Ophthalmology) Ebbie Ridge, MD as Referring Physician (Cardiology)  Surgical: He  has a past surgical history that includes Colonoscopy; Robot assisted laparoscopic radical prostatectomy (N/A, 06/23/2012); and Lymphadenectomy (Bilateral, 06/23/2012). Family His family history includes Colon cancer in his father. Social history  He reports that he has never smoked. He has never used smokeless tobacco. He reports current alcohol use of about 5.0 standard drinks of alcohol per week. He reports that he does not use drugs.  MEDICARE WELLNESS OBJECTIVES: Physical activity: Current Exercise Habits: Home exercise routine, Type of exercise: Other - see comments (golf, tennis, rides bike), Time (Minutes): 60, Frequency (Times/Week): 7, Weekly Exercise (Minutes/Week): 420, Intensity: Mild, Exercise limited by: None identified Cardiac risk factors: Cardiac Risk Factors include: advanced age (>60mn, >>5women);male gender;dyslipidemia;hypertension Depression/mood screen:   [facility]   Decreased Interest 0  Down, Depressed, Hopeless 0  PHQ - 2 Score 0    ADLs:  In your present state of health, do you have any difficulty performing the following activities: 09/05/2020 07/18/2020  Hearing? N N  Vision? N N  Difficulty concentrating or making decisions? N N  Walking or climbing stairs? N N  Dressing or bathing? N N  Doing errands, shopping? N N  Some recent data might be hidden     Cognitive Testing  Alert? Yes  Normal Appearance?Yes  Oriented to person? Yes  Place? Yes   Time? Yes  Recall of three objects?  Yes  Can perform simple calculations? Yes  Displays appropriate judgment?Yes  Can read the correct time from a watch face?Yes  EOL planning: Does Patient Have a Medical Advance Directive?: Yes Type of Advance Directive: Healthcare Power of Attorney, Living will Does patient want to make changes to medical advance directive?: No - Patient declined Copy of [place] Chart?: No - copy requested   Objective:   Today's Vitals   09/05/20 1135  BP: 118/74  Pulse: 64  Temp: 97.9 F (36.6 C)  SpO2: 99%  Weight: 230 lb (104.3 kg)    Body mass index is 27.27 kg/m.  General Appearance: Well nourished, in no apparent distress. Eyes: PERRLA, EOMs, conjunctiva no swelling or erythema Sinuses: No Frontal/maxillary tenderness ENT/Mouth: Ext aud  canals clear, TMs without erythema, bulging. No erythema, swelling, or exudate on post pharynx.  Tonsils not swollen or erythematous. Hearing normal.  Neck: Supple, thyroid normal.  Respiratory: Respiratory effort normal, BS without rales, rhonchi, wheezing or stridor.  Cardio: RRR with no MRGs. Brisk peripheral pulses without edema.  Abdomen: Soft, + BS.  Non tender, no guarding, rebound, hernias, masses. Lymphatics: Non tender without lymphadenopathy.  Musculoskeletal: Full ROM, 5/5 strength, normal gait.  Skin: Warm, dry without rashes, lesions, ecchymosis.  Neuro: Cranial nerves intact.  Normal muscle tone, no cerebellar symptoms. Sensation intact.  Psych: Awake and oriented X 3, normal affect, Insight and Judgment appropriate.   Medicare Attestation I have personally reviewed: The patient's medical and social history Their use of alcohol, tobacco or illicit drugs Their current medications and supplements The patient's functional ability including ADLs,fall risks, home safety risks, cognitive, and hearing and visual impairment Diet and physical activities Evidence for depression or mood disorders  The patient's weight, height, BMI, and visual acuity have been recorded in the chart.  I have made referrals, counseling, and provided education to the patient based on review of the above and I have provided the patient with a written personalized care plan for preventive services.     Izora Ribas, NP   09/05/2020

## 2020-09-05 ENCOUNTER — Ambulatory Visit (INDEPENDENT_AMBULATORY_CARE_PROVIDER_SITE_OTHER): Payer: Medicare Other | Admitting: Adult Health

## 2020-09-05 ENCOUNTER — Encounter: Payer: Self-pay | Admitting: Adult Health

## 2020-09-05 ENCOUNTER — Other Ambulatory Visit: Payer: Self-pay

## 2020-09-05 VITALS — BP 118/74 | HR 64 | Temp 97.9°F | Wt 230.0 lb

## 2020-09-05 DIAGNOSIS — Z0001 Encounter for general adult medical examination with abnormal findings: Secondary | ICD-10-CM

## 2020-09-05 DIAGNOSIS — I712 Thoracic aortic aneurysm, without rupture, unspecified: Secondary | ICD-10-CM

## 2020-09-05 DIAGNOSIS — I2510 Atherosclerotic heart disease of native coronary artery without angina pectoris: Secondary | ICD-10-CM | POA: Diagnosis not present

## 2020-09-05 DIAGNOSIS — I480 Paroxysmal atrial fibrillation: Secondary | ICD-10-CM

## 2020-09-05 DIAGNOSIS — R7309 Other abnormal glucose: Secondary | ICD-10-CM | POA: Diagnosis not present

## 2020-09-05 DIAGNOSIS — I4892 Unspecified atrial flutter: Secondary | ICD-10-CM

## 2020-09-05 DIAGNOSIS — C61 Malignant neoplasm of prostate: Secondary | ICD-10-CM

## 2020-09-05 DIAGNOSIS — I10 Essential (primary) hypertension: Secondary | ICD-10-CM | POA: Diagnosis not present

## 2020-09-05 DIAGNOSIS — Z79899 Other long term (current) drug therapy: Secondary | ICD-10-CM

## 2020-09-05 DIAGNOSIS — N1831 Chronic kidney disease, stage 3a: Secondary | ICD-10-CM | POA: Diagnosis not present

## 2020-09-05 DIAGNOSIS — I771 Stricture of artery: Secondary | ICD-10-CM

## 2020-09-05 DIAGNOSIS — E559 Vitamin D deficiency, unspecified: Secondary | ICD-10-CM

## 2020-09-05 DIAGNOSIS — E782 Mixed hyperlipidemia: Secondary | ICD-10-CM | POA: Diagnosis not present

## 2020-09-05 DIAGNOSIS — F419 Anxiety disorder, unspecified: Secondary | ICD-10-CM | POA: Diagnosis not present

## 2020-09-05 DIAGNOSIS — H40003 Preglaucoma, unspecified, bilateral: Secondary | ICD-10-CM | POA: Diagnosis not present

## 2020-09-05 DIAGNOSIS — R6889 Other general symptoms and signs: Secondary | ICD-10-CM | POA: Diagnosis not present

## 2020-09-05 DIAGNOSIS — E538 Deficiency of other specified B group vitamins: Secondary | ICD-10-CM

## 2020-09-05 DIAGNOSIS — Z0000 Encounter for general adult medical examination without abnormal findings: Secondary | ICD-10-CM

## 2020-09-05 DIAGNOSIS — E663 Overweight: Secondary | ICD-10-CM | POA: Diagnosis not present

## 2020-09-05 HISTORY — DX: Deficiency of other specified B group vitamins: E53.8

## 2020-09-05 NOTE — Patient Instructions (Addendum)
Suggest getting on sublingual B12 low dose daily   Check with insurance about shingrix - 2 shots 2-6 months apart   Zoster Vaccine, Recombinant injection What is this medication? ZOSTER VACCINE (ZOS ter vak SEEN) is a vaccine used to reduce the risk of getting shingles. This vaccine is not used to treat shingles or nerve pain fromshingles. This medicine may be used for other purposes; ask your health care provider orpharmacist if you have questions. COMMON BRAND NAME(S): [facility] What should I tell my care team before I take this medication? They need to know if you have any of these conditions: cancer immune system problems an unusual or allergic reaction to Zoster vaccine, other medications, foods, dyes, or preservatives pregnant or trying to get pregnant breast-feeding How should I use this medication? This vaccine is injected into a muscle. It is given by a health care provider. A copy of Vaccine Information Statements will be given before each vaccination. Be sure to read this information carefully each time. This sheet may changeoften. Talk to your health care provider about the use of this vaccine in children.This vaccine is not approved for use in children. Overdosage: If you think you have taken too much of this medicine contact apoison control center or emergency room at once. NOTE: This medicine is only for you. Do not share this medicine with others. What if I miss a dose? Keep appointments for follow-up (booster) doses. It is important not to miss your dose. Call your health care provider if you are unable to keep anappointment. What may interact with this medication? medicines that suppress your immune system medicines to treat cancer steroid medicines like prednisone or cortisone This list may not describe all possible interactions. Give your health care provider a list of all the medicines, herbs, non-prescription drugs, or dietary supplements you use. Also tell them if  you smoke, drink alcohol, or use illegaldrugs. Some items may interact with your medicine. What should I watch for while using this medication? Visit your health care provider regularly. This vaccine, like all vaccines, may not fully protect everyone. What side effects may I notice from receiving this medication? Side effects that you should report to your doctor or health care professionalas soon as possible: allergic reactions (skin rash, itching or hives; swelling of the face, lips, or tongue) trouble breathing Side effects that usually do not require medical attention (report these toyour doctor or health care professional if they continue or are bothersome): chills headache fever nausea pain, redness, or irritation at site where injected tiredness vomiting This list may not describe all possible side effects. Call your doctor for medical advice about side effects. You may report side effects to FDA at1-800-FDA-1088. Where should I keep my medication? This vaccine is only given by a health care provider. It will not be stored athome. NOTE: This sheet is a summary. It may not cover all possible information. If you have questions about this medicine, talk to your doctor, pharmacist, orhealth care provider.  2022 Elsevier/Gold Standard (2019-03-17 16:23:07)

## 2020-11-24 DIAGNOSIS — Z23 Encounter for immunization: Secondary | ICD-10-CM | POA: Diagnosis not present

## 2020-12-07 ENCOUNTER — Other Ambulatory Visit: Payer: Self-pay | Admitting: Internal Medicine

## 2020-12-07 ENCOUNTER — Other Ambulatory Visit: Payer: Self-pay | Admitting: Cardiology

## 2020-12-07 MED ORDER — EZETIMIBE 10 MG PO TABS: ORAL_TABLET | ORAL | 3 refills | Status: DC

## 2020-12-09 NOTE — Telephone Encounter (Signed)
Prescription refill request for Eliquis received. Indication:Afib Last office visit:3/22 Scr:1.2 Age: 75 Weight:104.3 kg  Prescription refilled

## 2020-12-26 NOTE — Progress Notes (Signed)
Future Appointments  Date Time Provider [place]  12/27/2020 10:00 AM Unk Pinto, MD [facility] None  07/22/2021      CPE  3:00 PM Unk Pinto, MD [facility] None  09/05/2021      Wellness 11:30 AM Liane Comber, NP [facility] None    History of Present Illness:      This very nice 75 y.o. MWM presents for 6  month follow up with HTN, HLD, pAfib/Flutter, Pre-Diabetes and Vitamin D Deficiency.  He also presents today for concern of a "lump " over his mid back.        Patient is treated for HTN  (2006) & BP has been controlled at home. Today's BP was initially elevated & rechecked at goal  -  128/90.  Patient has hx/o [facility] in 1988 - recovered completely.  Diagnosed Jan 2018 with Afib/Flutter & started on Eliquis. Then March 2018 had CV and Heart cath found Nl Coronary Aa. Also has CKD3a. Followed by Dr [name]. Patient has had no complaints of any cardiac type chest pain, palpitations, dyspnea / orthopnea / PND, dizziness, claudication or dependent edema.       Hyperlipidemia is controlled with diet & Simvastatin /Ezetimibe. Patient denies myalgias or other med SE's. Last Lipids were at goal:  Lab Results  Component Value Date   CHOL 165 07/18/2020   HDL 81 07/18/2020   LDLCALC 70 07/18/2020   TRIG 68 07/18/2020   CHOLHDL 2.0 07/18/2020     Also, the patient is monitored expectantly for glucose intolerance and has had no symptoms of reactive hypoglycemia, diabetic polys, paresthesias or visual blurring.  Last A1c was normal & at goal:  Lab Results  Component Value Date   HGBA1C 5.0 07/18/2020        Further, the patient also has history of Vitamin D Deficiency ("24" /2008) and supplements vitamin D without any suspected side-effects. Last vitamin D was at goal:  Lab Results  Component Value Date   VD25OH 65 07/18/2020     Current Outpatient Medications on File Prior to Visit  Medication Sig   atenolol  25 MG tablet TAKE ONE TABLET DAILY   VITAMIN D  5000 UNITS  Take daily.   diphenhydrAMINE 25 mg capsule Take at bedtime as needed for sleep.   ELIQUIS 5 MG TABS tablet TAKE ONE TABLET  TWICE A DAY   ezetimibe 10 MG tablet Take  1 tablet  Daily  for Cholesterol                        MOVE FREE JOINT TABS Take 1 tablet every morning.   hydrochlorothiazide 25 MG tablet TAKE ONE TABLET  DAILY   ibuprofen  200 MG tablet Take as needed    losartan  100 MG tablet TAKE ONE TABLET DAILY    simvastatin  40 MG tablet Take  1 tablet  at Bedtime     valACYclovir 500 MG tablet Take  1 tablet  Daily      Allergies  Allergen Reactions   Ultram [Tramadol] Anaphylaxis   Atorvastatin     Other reaction(s): Myalgias (intolerance)   Doxazosin Other (See Comments)    Dizziness    Telmisartan Hypertension and Other (See Comments)     PMHx:   Past Medical History:  Diagnosis Date   Anxiety    Coronary artery disease    Hyperlipidemia    Hypertension    ICH (intracerebral hemorrhage) ([place])    Other testicular  hypofunction    Permanent atrial fibrillation (HCC)    Prostate cancer ([place])    Vitamin D deficiency      Immunization History  Administered Date(s) Administered   Fluad Quad(high Dose) 12/05/2019   Influenza, High Dose  01/31/2015, 12/03/2017   Influenza 12/19/2015, 12/24/2016   MMR 02/23/2005   PFIZER SARS-COV-2 Vacc  03/19/2019, 04/11/2019, 10/27/2019   Pneumococcal -13 01/31/2015   Pneumococcal -23 02/23/2010, 03/30/2016   Tdap 02/23/2005     Past Surgical History:  Procedure Laterality Date   COLONOSCOPY     LYMPHADENECTOMY Bilateral 06/23/2012   LYMPHADENECTOMY;  Surgeon: Dutch Gray, MD;   ROBOT ASSISTED LAPAROSCOPIC RADICAL PROSTATECTOMY N/A 06/23/2012   Procedure: ROBOTIC ASSISTED LAPAROSCOPIC RADICAL PROSTATECTOMY LEVEL 2;  Surgeon: Dutch Gray, MD;  Location: [facility];  Service: Urology;  Laterality: N/A;    FHx:    Reviewed / unchanged  SHx:    Reviewed / unchanged   Systems Review:  Constitutional: Denies  fever, chills, wt changes, headaches, insomnia, fatigue, night sweats, change in appetite. Eyes: Denies redness, blurred vision, diplopia, discharge, itchy, watery eyes.  ENT: Denies discharge, congestion, post nasal drip, epistaxis, sore throat, earache, hearing loss, dental pain, tinnitus, vertigo, sinus pain, snoring.  CV: Denies chest pain, palpitations, irregular heartbeat, syncope, dyspnea, diaphoresis, orthopnea, PND, claudication or edema. Respiratory: denies cough, dyspnea, DOE, pleurisy, hoarseness, laryngitis, wheezing.  Gastrointestinal: Denies dysphagia, odynophagia, heartburn, reflux, water brash, abdominal pain or cramps, nausea, vomiting, bloating, diarrhea, constipation, hematemesis, melena, hematochezia  or hemorrhoids. Genitourinary: Denies dysuria, frequency, urgency, nocturia, hesitancy, discharge, hematuria or flank pain. Musculoskeletal: Denies arthralgias, myalgias, stiffness, jt. swelling, pain, limping or strain/sprain.  Skin: Denies pruritus, rash, hives, warts, acne, eczema or change in skin lesion(s). Neuro: No weakness, tremor, incoordination, spasms, paresthesia or pain. Psychiatric: Denies confusion, memory loss or sensory loss. Endo: Denies change in weight, skin or hair change.  Heme/Lymph: No excessive bleeding, bruising or enlarged lymph nodes.  Physical Exam  BP 128/90   Pulse 69   Temp 97.8 F (36.6 C)   Resp 16   Ht 6' 5"  (1.956 m)   Wt 236 lb (107 kg)   SpO2 97%   BMI 27.99 kg/m   Appears  well nourished, well groomed  and in no distress.  Eyes: PERRLA, EOMs, conjunctiva no swelling or erythema. Sinuses: No frontal/maxillary tenderness ENT/Mouth: EAC's clear, TM's nl w/o erythema, bulging. Nares clear w/o erythema, swelling, exudates. Oropharynx clear without erythema or exudates. Oral hygiene is good. Tongue normal, non obstructing. Hearing intact.  Neck: Supple. Thyroid not palpable. Car 2+/2+ without bruits, nodes or JVD. Chest: Respirations  nl with BS clear & equal w/o rales, rhonchi, wheezing or stridor.  Cor: Heart sounds normal w/ regular rate and rhythm without sig. murmurs, gallops, clicks or rubs. Peripheral pulses normal and equal  without edema.  Abdomen: Soft & bowel sounds normal. Non-tender w/o guarding, rebound, hernias, masses or organomegaly.  Lymphatics: Unremarkable.  Musculoskeletal: Full ROM all peripheral extremities, joint stability, 5/5 strength and normal gait.  Skin: Warm, dry without exposed rashes, lesions or ecchymosis apparent.  There is a 2.5 x 1  cm sebaceous cyst in the lower mid /middle back over the spine.  Neuro: Cranial nerves intact, reflexes equal bilaterally. Sensory-motor testing grossly intact. Tendon reflexes grossly intact.  Pysch: Alert & oriented x 3.  Insight and judgement nl & appropriate. No ideations.  Assessment and Plan:  1. Essential hypertension  - Continue medication, monitor blood pressure at home.  - Continue  DASH diet.  Reminder to go to the ER if any CP,  SOB, nausea, dizziness, severe HA, changes vision/speech.   - CBC with Differential/Platelet - COMPLETE METABOLIC PANEL WITH GFR - Magnesium - TSH  2.  Hyperlipidemia, mixed  - Continue diet/meds, exercise,& lifestyle modifications.  - Continue monitor periodic cholesterol/liver & renal functions   - Lipid panel - TSH  3. Paroxysmal atrial fibrillation (HCC)  - TSH  4. Abnormal glucose  - Continue diet, exercise  - Lifestyle modifications.  - Monitor appropriate labs  - Hemoglobin A1c - Insulin, random  5. Vitamin D deficiency   - Continue supplementation    - VITAMIN D 25 Hydroxy   6. Stage 3a chronic kidney disease (HCC)  - COMPLETE METABOLIC PANEL WITH GFR  7. Medication management  - CBC with Differential/Platelet - COMPLETE METABOLIC PANEL WITH GFR - Magnesium - Lipid panel - TSH - Hemoglobin A1c - Insulin, random - VITAMIN D 25 Hydroxy        Discussed  regular exercise, BP  monitoring, weight control to achieve/maintain BMI less than 25 and discussed med and SE's. Recommended labs to assess and monitor clinical status with further disposition pending results of labs.  Patient to return for excision of sebaceous cyst.                                                                                                                                                         I discussed the assessment and treatment plan with the patient. The patient was provided an opportunity to ask questions and all were answered. The patient agreed with the plan and demonstrated an understanding of the instructions.  I provided over 30 minutes of exam, counseling, chart review and  complex critical decision making.        The patient was advised to call back or seek an in-person evaluation if the symptoms worsen or if the condition fails to improve as anticipated.   Kirtland Bouchard, MD

## 2020-12-27 ENCOUNTER — Encounter: Payer: Self-pay | Admitting: Internal Medicine

## 2020-12-27 ENCOUNTER — Other Ambulatory Visit: Payer: Self-pay

## 2020-12-27 ENCOUNTER — Ambulatory Visit (INDEPENDENT_AMBULATORY_CARE_PROVIDER_SITE_OTHER): Payer: Medicare Other | Admitting: Internal Medicine

## 2020-12-27 VITALS — BP 128/90 | HR 69 | Temp 97.8°F | Resp 16 | Ht 77.0 in | Wt 236.0 lb

## 2020-12-27 DIAGNOSIS — I10 Essential (primary) hypertension: Secondary | ICD-10-CM | POA: Diagnosis not present

## 2020-12-27 DIAGNOSIS — I2510 Atherosclerotic heart disease of native coronary artery without angina pectoris: Secondary | ICD-10-CM | POA: Diagnosis not present

## 2020-12-27 DIAGNOSIS — E782 Mixed hyperlipidemia: Secondary | ICD-10-CM | POA: Diagnosis not present

## 2020-12-27 DIAGNOSIS — N1831 Chronic kidney disease, stage 3a: Secondary | ICD-10-CM

## 2020-12-27 DIAGNOSIS — I480 Paroxysmal atrial fibrillation: Secondary | ICD-10-CM | POA: Diagnosis not present

## 2020-12-27 DIAGNOSIS — Z79899 Other long term (current) drug therapy: Secondary | ICD-10-CM

## 2020-12-27 DIAGNOSIS — E559 Vitamin D deficiency, unspecified: Secondary | ICD-10-CM

## 2020-12-27 DIAGNOSIS — R7309 Other abnormal glucose: Secondary | ICD-10-CM

## 2020-12-27 NOTE — Patient Instructions (Signed)

## 2020-12-28 ENCOUNTER — Encounter: Payer: Self-pay | Admitting: Internal Medicine

## 2020-12-28 NOTE — Progress Notes (Signed)
============================================================ -   Test results slightly outside the reference range are not unusual. If there is anything important, I will review this with you,  otherwise it is considered normal test values.  If you have further questions,  please do not hesitate to contact me at the office or via My Chart.  ============================================================ ============================================================  -  CBC is Normal except MCV is very slightly increased which may occur with a deficiency of Vitamin B12.  So, recommend that you take  a  Vitamin B12 supplement - preferably of the type that you dissolve under your tongue. ============================================================ ============================================================  - Kidney functions are still Stage 3 a   &  Stable  ============================================================ ============================================================  -  -  Total  Chol =   165    -   Great             (  Ideal  or  Goal is less than 180  !  )   - and   -  Bad / Dangerous LDL  Chol =   64     - also  Great              (  Ideal  or  Goal is less than 70  !  )    - Please continue Simvastatin (Zocor)  & Ezetimibe (Zetia) same  ============================================================ ============================================================  - A1c - Normal  - No Diabetes  - Great ! ============================================================ ============================================================  - Vitamin D = 60 - Great !    - Please continue dose same ============================================================ ============================================================  - All Else - CBC - Kidneys - Electrolytes - Liver - Magnesium & Thyroid    - all  Normal /  OK ============================================================ ============================================================  - Keep up the [place] Work  !  ============================================================ ============================================================

## 2020-12-30 ENCOUNTER — Telehealth: Payer: Self-pay | Admitting: Cardiology

## 2020-12-30 LAB — CBC WITH DIFFERENTIAL/PLATELET
Absolute Monocytes: 531 cells/uL (ref 200–950)
Basophils Absolute: 89 cells/uL (ref 0–200)
Basophils Relative: 1.9 %
Eosinophils Absolute: 268 cells/uL (ref 15–500)
Eosinophils Relative: 5.7 %
HCT: 42.5 % (ref 38.5–50.0)
Hemoglobin: 14.4 g/dL (ref 13.2–17.1)
Lymphs Abs: 1490 cells/uL (ref 850–3900)
MCH: 34.4 pg — ABNORMAL HIGH (ref 27.0–33.0)
MCHC: 33.9 g/dL (ref 32.0–36.0)
MCV: 101.4 fL — ABNORMAL HIGH (ref 80.0–100.0)
MPV: 9.6 fL (ref 7.5–12.5)
Monocytes Relative: 11.3 %
Neutro Abs: 2322 cells/uL (ref 1500–7800)
Neutrophils Relative %: 49.4 %
Platelets: 163 10*3/uL (ref 140–400)
RBC: 4.19 10*6/uL — ABNORMAL LOW (ref 4.20–5.80)
RDW: 11.9 % (ref 11.0–15.0)
Total Lymphocyte: 31.7 %
WBC: 4.7 10*3/uL (ref 3.8–10.8)

## 2020-12-30 LAB — COMPLETE METABOLIC PANEL WITH GFR
AG Ratio: 1.6 (calc) (ref 1.0–2.5)
ALT: 21 U/L (ref 9–46)
AST: 27 U/L (ref 10–35)
Albumin: 4.2 g/dL (ref 3.6–5.1)
Alkaline phosphatase (APISO): 59 U/L (ref 35–144)
BUN/Creatinine Ratio: 15 (calc) (ref 6–22)
BUN: 19 mg/dL (ref 7–25)
CO2: 30 mmol/L (ref 20–32)
Calcium: 9.3 mg/dL (ref 8.6–10.3)
Chloride: 103 mmol/L (ref 98–110)
Creat: 1.31 mg/dL — ABNORMAL HIGH (ref 0.70–1.28)
Globulin: 2.7 g/dL (calc) (ref 1.9–3.7)
Glucose, Bld: 93 mg/dL (ref 65–99)
Potassium: 4.2 mmol/L (ref 3.5–5.3)
Sodium: 140 mmol/L (ref 135–146)
Total Bilirubin: 2.2 mg/dL — ABNORMAL HIGH (ref 0.2–1.2)
Total Protein: 6.9 g/dL (ref 6.1–8.1)
eGFR: 57 mL/min/{1.73_m2} — ABNORMAL LOW (ref >=60)

## 2020-12-30 LAB — LIPID PANEL
Cholesterol: 165 mg/dL (ref <200)
HDL: 88 mg/dL (ref >=40)
LDL Cholesterol (Calc): 64 mg/dL (calc)
Non-HDL Cholesterol (Calc): 77 mg/dL (calc) (ref <130)
Total CHOL/HDL Ratio: 1.9 (calc) (ref <5.0)
Triglycerides: 57 mg/dL (ref <150)

## 2020-12-30 LAB — MAGNESIUM: Magnesium: 2.2 mg/dL (ref 1.5–2.5)

## 2020-12-30 LAB — VITAMIN D 25 HYDROXY (VIT D DEFICIENCY, FRACTURES): Vit D, 25-Hydroxy: 60 ng/mL (ref 30–100)

## 2020-12-30 LAB — HEMOGLOBIN A1C
Hgb A1c MFr Bld: 5 % of total Hgb (ref <5.7)
Mean Plasma Glucose: 97 mg/dL
eAG (mmol/L): 5.4 mmol/L

## 2020-12-30 LAB — TSH: TSH: 2.49 mIU/L (ref 0.40–4.50)

## 2020-12-30 LAB — INSULIN, RANDOM: Insulin: 7.6 u[IU]/mL

## 2020-12-30 NOTE — Telephone Encounter (Signed)
Patient's wife wants to know if a CT angio is going to be done again. If so she would like to have it done prior to his March appt so the results could be in for his appt.

## 2020-12-30 NOTE — Telephone Encounter (Signed)
Spoke to wife-requesting CTA chest/aorta to be completed prior to appt on 3/24.   CTA completed 05/27/20-due in 1 year.  Advised should be ok to complete prior to appt.    Advised will call closer to due date to arrange.

## 2021-01-07 ENCOUNTER — Encounter: Payer: Medicare Other | Admitting: Internal Medicine

## 2021-01-07 NOTE — Progress Notes (Signed)
Future Appointments  Date Time Provider [place]  01/08/2021  4:30 PM Unk Pinto, MD [facility] None  05/16/2021 11:00 AM Lelon Perla, MD [facility]  07/22/2021  3:00 PM Unk Pinto, MD [facility] None  09/05/2021 11:30 AM Liane Comber, NP [facility] None    History of Present Illness:     Patient is a very nice 75 yo MWM with hx/o HTN, ASHD who presents with concerns re: a lump  on his lower back.   He & his family are particularly concerned as he had a brother who died with melanoma or sarcoma.  He denies any hypertensive sx's as HA's , dizziness, CP, palpitations, edema.   Medications   Current Outpatient Medications (Cardiovascular):    atenolol (TENORMIN) 25 MG tablet, TAKE ONE TABLET BY MOUTH DAILY   ezetimibe (ZETIA) 10 MG tablet, Take  1 tablet  Daily  for Cholesterol                           /      TAKE 1 TABLET BY MOUTH DAILY   hydrochlorothiazide (HYDRODIURIL) 25 MG tablet, TAKE ONE TABLET BY MOUTH DAILY   losartan (COZAAR) 100 MG tablet, TAKE ONE TABLET BY MOUTH DAILY FOR FOR BLOOD PRESSURE   simvastatin (ZOCOR) 40 MG tablet, Take  1 tablet  at Bedtime  for Cholesterol  /Patient knows to take by mouth  Current Outpatient Medications (Respiratory):    diphenhydrAMINE (BENADRYL) 25 mg capsule, Take 25 mg by mouth at bedtime as needed for sleep.  Current Outpatient Medications (Analgesics):    ibuprofen (ADVIL) 200 MG tablet, Take by mouth.  Current Outpatient Medications (Hematological):    ELIQUIS 5 MG TABS tablet, TAKE ONE TABLET BY MOUTH TWICE A DAY  Current Outpatient Medications (Other):    Cholecalciferol (VITAMIN D3) 5000 UNITS CAPS, Take 5,000 Int'l Units by mouth daily.   Glucos-Chond-Hyal Ac-Ca Fructo (MOVE FREE JOINT HEALTH ADVANCE) TABS, Take 1 tablet by mouth every morning.   valACYclovir (VALTREX) 500 MG tablet, Take  1 tablet  Daily  to Prevent Fever Blisters  Problem list He has Hyperlipidemia, LDL goal <50;  Hypertension; Anxiety; Vitamin D deficiency; Prostate cancer ([place]); Medication management; Abnormal glucose; Overweight (BMI 25.0-29.9); Atrial flutter ([place]); Glaucoma suspect, bilateral; Paroxysmal atrial fibrillation (HCC); CKD (chronic kidney disease) stage 3, GFR 30-59 ml/min ([place]); Tortuous aorta ([place]); Thoracic aortic aneurysm without rupture; Coronary artery disease involving native coronary artery of native heart without angina pectoris; and B12 deficiency on their problem list.   Observations/Objective:  BP 120/88   Pulse 64   Temp 97.9 F (36.6 C)   Resp 16   Ht 6\' 5"  (1.956 m)   Wt 234 lb (106.1 kg)   SpO2 97%   BMI 27.75 kg/m   Skin exam finds an ~ 1.5 cm x 2 cm  sub-cutaneous lump along the lower midline back - 2-3 " left lateral to the spine.   Procedure   (CPT - 17494 )       After informed consent & aseptic prep with alcohol the area was anesthetized with  4-5 ml of Marcaine 0.5% sq . Then with a # 10 scalpel a 3-3 cn transverse horizontal incision was made overlying the mass. After identifying & securing by traction , the mas was gently & sharply excised & delivered to send to path analysis. Then the wound cavity was collapsed everting the opposing skin edges with # 4 vertical  mattress sutures of Nylon 3-0. Then the wound edges were aligned and effaced with a running suture of Nylon x 5 sutures . Antibiotic ung - 2" guaze &  6" x 6 " Tegaderm applied. Patient & wife instructed in post -op wound care.    Assessment and Plan:   1. Essential hypertension   2. Neoplasm of uncertain behavior of skin    Follow Up Instructions:        I discussed the assessment and treatment plan with the patient. The patient was provided an opportunity to ask questions and all were answered. The patient agreed with the plan and demonstrated an understanding of the instructions.       The patient was advised to return 5-6 days for suture removal  ( mattress sutures )      Kirtland Bouchard, MD

## 2021-01-08 ENCOUNTER — Encounter: Payer: Self-pay | Admitting: Internal Medicine

## 2021-01-08 ENCOUNTER — Ambulatory Visit (INDEPENDENT_AMBULATORY_CARE_PROVIDER_SITE_OTHER): Payer: Medicare Other | Admitting: Internal Medicine

## 2021-01-08 ENCOUNTER — Other Ambulatory Visit: Payer: Self-pay | Admitting: Internal Medicine

## 2021-01-08 ENCOUNTER — Other Ambulatory Visit: Payer: Self-pay

## 2021-01-08 VITALS — BP 120/88 | HR 64 | Temp 97.9°F | Resp 16 | Ht 77.0 in | Wt 234.0 lb

## 2021-01-08 DIAGNOSIS — I10 Essential (primary) hypertension: Secondary | ICD-10-CM

## 2021-01-08 DIAGNOSIS — L720 Epidermal cyst: Secondary | ICD-10-CM | POA: Diagnosis not present

## 2021-01-08 DIAGNOSIS — I2510 Atherosclerotic heart disease of native coronary artery without angina pectoris: Secondary | ICD-10-CM

## 2021-01-08 DIAGNOSIS — L723 Sebaceous cyst: Secondary | ICD-10-CM | POA: Diagnosis not present

## 2021-01-08 DIAGNOSIS — D485 Neoplasm of uncertain behavior of skin: Secondary | ICD-10-CM | POA: Diagnosis not present

## 2021-01-13 NOTE — Patient Instructions (Signed)
Epidermoid Cyst An epidermoid cyst, also known as epidermal cyst, is a sac made of skin tissue. The sac contains a substance called keratin. Keratin is a protein that is normally secreted through the hair follicles. When keratin becomes trapped in the top layer of skin (epidermis), it can form an epidermoid cyst. Epidermoid cysts can be found anywhere on your body. These cysts are usually harmless (benign), and they may not cause symptoms unless they become inflamed or infected. What are the causes? This condition may be caused by: A blocked hair follicle. A hair that curls and re-enters the skin instead of growing straight out of the skin (ingrown hair). A blocked pore. Irritated skin. An injury to the skin. Certain conditions that are passed along from parent to child (inherited). Human papillomavirus (HPV). This happens rarely when cysts occur on the bottom of the feet. Long-term (chronic) sun damage to the skin. What increases the risk? The following factors may make you more likely to develop an epidermoid cyst: Having acne. Being male. Having an injury to the skin. Being past puberty. Having certain rare genetic disorders. What are the signs or symptoms? The only symptom of this condition may be a small, painless lump underneath the skin. When an epidermal cyst ruptures, it may become inflamed. True infection in cysts is rare. Symptoms may include: Redness. Inflammation. Tenderness. Warmth. Keratin draining from the cyst. Keratin is grayish-white, bad-smelling substance. Pus draining from the cyst. How is this diagnosed? This condition is diagnosed with a physical exam. In some cases, you may have a sample of tissue (biopsy) taken from your cyst to be examined under a microscope or tested for bacteria. You may be referred to a health care provider who specializes in skin care (dermatologist). How is this treated? If a cyst becomes inflamed, treatment may include: Opening and  draining the cyst, done by a health care provider. After draining, minor surgery to remove the rest of the cyst may be done. Taking antibiotic medicine. Having injections of medicines (steroids) that help to reduce inflammation. Having surgery to remove the cyst. Surgery may be done if the cyst: Becomes large. Bothers you. Has a chance of turning into cancer. Do not try to open a cyst yourself. Follow these instructions at home: Medicines If you were prescribed an antibiotic medicine, take it it as told by your health care provider. Do not stop using the antibiotic even if you start to feel better. Take over-the-counter and prescription medicines only as told by your health care provider. General instructions Keep the area around your cyst clean and dry. Wear loose, dry clothing. Avoid touching your cyst. Check your cyst every day for signs of infection. Check for: Redness, swelling, or pain. Fluid or blood. Warmth. Pus or a bad smell. Keep all follow-up visits. This is important. How is this prevented? Wear clean, dry, clothing. Avoid wearing tight clothing. Keep your skin clean and dry. Take showers or baths every day. Contact a health care provider if: Your cyst develops symptoms of infection. Your condition is not improving or is getting worse. You develop a cyst that looks different from other cysts you have had. You have a fever. Get help right away if: Redness spreads from the cyst into the surrounding area. Summary An epidermoid cyst is a sac made of skin tissue. These cysts are usually harmless (benign), and they may not cause symptoms unless they become inflamed. If a cyst becomes inflamed, treatment may include surgery to open and drain the cyst,  or to remove it. Treatment may also include medicines by mouth or through an injection. Take over-the-counter and prescription medicines only as told by your health care provider. If you were prescribed an antibiotic medicine,  take it as told by your health care provider. Do not stop using the antibiotic even if you start to feel better. Contact a health care provider if your condition is not improving or is getting worse. Keep all follow-up visits as told by your health care provider. This is important. This information is not intended to replace advice given to you by your health care provider. Make sure you discuss any questions you have with your health care provider. Document Revised: 05/17/2019 Document Reviewed: 05/17/2019 Elsevier Patient Education  [place].

## 2021-01-13 NOTE — Progress Notes (Signed)
============================================================ ============================================================  -   Path report is OK - No sign of cancer   ============================================================ ============================================================

## 2021-01-13 NOTE — Progress Notes (Signed)
Future Appointments  Date Time Provider [place]  01/14/2021  9:00 AM Unk Pinto, MD [facility] None  05/16/2021 11:00 AM Lelon Perla, MD [facility]  07/22/2021  3:00 PM Unk Pinto, MD [facility] None  09/05/2021 11:30 AM Liane Comber, NP [facility] None    History of Present Illness:      Patient is a very nice 75 yo MWM with hx/o HTN, ASHD who returns 5 days s/p excision of an inflamed  partially ruptured epidermoid cyst of the lumbar area. Path report is benign.   Medications   Current Outpatient Medications (Cardiovascular):    atenolol (TENORMIN) 25 MG tablet, TAKE ONE TABLET BY MOUTH DAILY   ezetimibe (ZETIA) 10 MG tablet, Take  1 tablet  Daily  for Cholesterol                           /      TAKE 1 TABLET BY MOUTH DAILY   hydrochlorothiazide (HYDRODIURIL) 25 MG tablet, TAKE ONE TABLET BY MOUTH DAILY   losartan (COZAAR) 100 MG tablet, TAKE ONE TABLET BY MOUTH DAILY FOR FOR BLOOD PRESSURE   simvastatin (ZOCOR) 40 MG tablet, Take  1 tablet  at Bedtime  for Cholesterol  /Patient knows to take by mouth  Current Outpatient Medications (Respiratory):    diphenhydrAMINE (BENADRYL) 25 mg capsule, Take 25 mg by mouth at bedtime as needed for sleep.  Current Outpatient Medications (Analgesics):    ibuprofen (ADVIL) 200 MG tablet, Take by mouth.  Current Outpatient Medications (Hematological):    ELIQUIS 5 MG TABS tablet, TAKE ONE TABLET BY MOUTH TWICE A DAY  Current Outpatient Medications (Other):    Cholecalciferol (VITAMIN D3) 5000 UNITS CAPS, Take 5,000 Int'l Units by mouth daily.   Glucos-Chond-Hyal Ac-Ca Fructo (MOVE FREE JOINT HEALTH ADVANCE) TABS, Take 1 tablet by mouth every morning.   valACYclovir (VALTREX) 500 MG tablet, Take  1 tablet  Daily  to Prevent Fever Blisters  Problem list He has Hyperlipidemia, LDL goal <50; Hypertension; Anxiety; Vitamin D deficiency; Prostate cancer ([place]); Medication management; Abnormal glucose; Overweight (BMI 25.0-29.9);  Atrial flutter ([place]); Glaucoma suspect, bilateral; Paroxysmal atrial fibrillation (HCC); CKD (chronic kidney disease) stage 3, GFR 30-59 ml/min ([place]); Tortuous aorta ([place]); Thoracic aortic aneurysm without rupture; Coronary artery disease involving native coronary artery of native heart without angina pectoris; and B12 deficiency on their problem list.   Observations/Objective:  BP 118/80   Pulse 68   Temp 97.9 F (36.6 C)   Resp 16   Ht 6\' 5"  (1.956 m)   Wt 231 lb 12.8 oz (105.1 kg)   SpO2 98%   BMI 27.49 kg/m   Exam of wound site appears healing w/o signs of infection. # 4 vertical mattress sutures removed and running lock stitch intact.   Wound redressed with sterile dressing  Assessment and Plan:   1. Epidermoid cyst of skin of back   Follow Up Instructions:  ROV 1 week for final suture removal         I discussed the assessment and treatment plan with the patient. The patient was provided an opportunity to ask questions and all were answered. The patient agreed with the plan and demonstrated an understanding of the instructions.       The patient was advised to call back or seek an in-person evaluation if the symptoms worsen or if the condition fails to improve as anticipated.    Kirtland Bouchard,  MD

## 2021-01-14 ENCOUNTER — Encounter: Payer: Self-pay | Admitting: Internal Medicine

## 2021-01-14 ENCOUNTER — Other Ambulatory Visit: Payer: Self-pay

## 2021-01-14 ENCOUNTER — Ambulatory Visit: Payer: Medicare Other | Admitting: Internal Medicine

## 2021-01-14 VITALS — BP 118/80 | HR 68 | Temp 97.9°F | Resp 16 | Ht 77.0 in | Wt 231.8 lb

## 2021-01-14 DIAGNOSIS — L720 Epidermal cyst: Secondary | ICD-10-CM

## 2021-01-20 NOTE — Progress Notes (Signed)
      Patient is a very nice 75 yo MWM s/p excision of an epidermoid cyst of his low back  12 days ago on 11/16    & returns for recheck & final suture removal.       Wound appears clean, well healed w/o signs of infection & sutures removed.

## 2021-01-21 ENCOUNTER — Other Ambulatory Visit: Payer: Self-pay

## 2021-01-21 ENCOUNTER — Ambulatory Visit: Payer: Medicare Other | Admitting: Internal Medicine

## 2021-01-21 ENCOUNTER — Encounter: Payer: Self-pay | Admitting: Internal Medicine

## 2021-01-21 VITALS — BP 116/68 | HR 62 | Temp 97.9°F | Resp 16 | Ht 77.0 in | Wt 233.2 lb

## 2021-01-21 DIAGNOSIS — L720 Epidermal cyst: Secondary | ICD-10-CM

## 2021-01-30 DIAGNOSIS — Z85828 Personal history of other malignant neoplasm of skin: Secondary | ICD-10-CM | POA: Diagnosis not present

## 2021-01-30 DIAGNOSIS — D1801 Hemangioma of skin and subcutaneous tissue: Secondary | ICD-10-CM | POA: Diagnosis not present

## 2021-01-30 DIAGNOSIS — L821 Other seborrheic keratosis: Secondary | ICD-10-CM | POA: Diagnosis not present

## 2021-01-30 DIAGNOSIS — L570 Actinic keratosis: Secondary | ICD-10-CM | POA: Diagnosis not present

## 2021-01-30 DIAGNOSIS — D1724 Benign lipomatous neoplasm of skin and subcutaneous tissue of left leg: Secondary | ICD-10-CM | POA: Diagnosis not present

## 2021-01-30 DIAGNOSIS — L812 Freckles: Secondary | ICD-10-CM | POA: Diagnosis not present

## 2021-03-03 ENCOUNTER — Other Ambulatory Visit: Payer: Self-pay | Admitting: Internal Medicine

## 2021-03-03 MED ORDER — OLMESARTAN MEDOXOMIL 40 MG PO TABS: ORAL_TABLET | ORAL | 3 refills | Status: DC

## 2021-03-03 MED ORDER — AMLODIPINE BESYLATE 10 MG PO TABS: ORAL_TABLET | ORAL | 1 refills | Status: DC

## 2021-03-26 ENCOUNTER — Telehealth: Payer: Self-pay | Admitting: Cardiology

## 2021-03-26 NOTE — Telephone Encounter (Signed)
Pts wife is calling in wanting to know if she can have her husbands annual ct done prior to appt w/ Dr. [name] so that results are able to be discussed when the pt comes in... please advise

## 2021-03-26 NOTE — Telephone Encounter (Signed)
Called pt's wife. She wants to know can the pt have his CT scan before seeing Dr. [name] (05/16/2021). Will get message over to Dr. [name] and his nurse to see if the order can be placed. They would like to have the scan done at [place] since it is closer to their home.

## 2021-03-26 NOTE — Telephone Encounter (Signed)
Spoke with pt wife, made aware that he is not due for the CTA until April and insurance will not cover until after the previous date. Patient wife voiced understanding and we will get scheduled in march at follow up appointment.

## 2021-05-08 NOTE — Progress Notes (Signed)
? ? ? ? ?HPI:FU atrial fibrillation and coronary artery disease.  Patient previously followed by Dr. Adrian Prows in [facility]. Cardiac catheterization 2018 showed normal coronary arteries. CTA April 2021 showed no pulmonary embolus, dilated aortic root at 4.2 cm, nonobstructive coronary disease other than severe stenosis in ramus intermedius. Nuclear study May 2021 showed ejection fraction 51% and no ischemia or infarction. Patient previously treated with flecainide for atrial fibrillation but this was discontinued.  He has therefore been treated with rate control and anticoagulation.  Last echocardiogram September 2021 showed ejection fraction 60 to 65%, mild left ventricular hypertrophy, severe left atrial enlargement, moderate right atrial enlargement.  CTA April 2022 showed ascending thoracic aorta at 4.3 cm, aortic atherosclerosis and coronary artery calcifications.  Since last seen, he denies dyspnea, chest pain, palpitations or syncope.  No bleeding. ? ?Current Outpatient Medications  ?Medication Sig Dispense Refill  ? amLODipine (NORVASC) 10 MG tablet Take  1/2 to 1 tablet  Daily  for BP 90 tablet 1  ? atenolol (TENORMIN) 25 MG tablet TAKE ONE TABLET BY MOUTH DAILY 90 tablet 3  ? Cholecalciferol (VITAMIN D3) 5000 UNITS CAPS Take 5,000 Int'l Units by mouth daily.    ? diphenhydrAMINE (BENADRYL) 25 mg capsule Take 25 mg by mouth at bedtime as needed for sleep.    ? ELIQUIS 5 MG TABS tablet TAKE ONE TABLET BY MOUTH TWICE A DAY 180 tablet 3  ? ezetimibe (ZETIA) 10 MG tablet Take  1 tablet  Daily  for Cholesterol                           /      TAKE 1 TABLET BY MOUTH DAILY 90 tablet 3  ? Glucos-Chond-Hyal Ac-Ca Fructo (MOVE FREE JOINT HEALTH ADVANCE) TABS Take 1 tablet by mouth every morning.    ? hydrochlorothiazide (HYDRODIURIL) 25 MG tablet TAKE ONE TABLET BY MOUTH DAILY 90 tablet 3  ? ibuprofen (ADVIL) 200 MG tablet Take by mouth.    ? olmesartan (BENICAR) 40 MG tablet Take  1 tablet  at Bedtime for  BP 90 tablet 3  ? simvastatin (ZOCOR) 40 MG tablet Take  1 tablet  at Bedtime  for Cholesterol  /Patient knows to take by mouth 90 tablet 3  ? valACYclovir (VALTREX) 500 MG tablet Take  1 tablet  Daily  to Prevent Fever Blisters 90 tablet 1  ? ?No current facility-administered medications for this visit.  ? ? ? ?Past Medical History:  ?Diagnosis Date  ? Anxiety   ? Coronary artery disease   ? Hyperlipidemia   ? Hypertension   ? ICH (intracerebral hemorrhage) ([place])   ? Other testicular hypofunction   ? Permanent atrial fibrillation ([place])   ? Prostate cancer ([place])   ? Vitamin D deficiency   ? ? ?Past Surgical History:  ?Procedure Laterality Date  ? COLONOSCOPY    ? LYMPHADENECTOMY Bilateral 06/23/2012  ? Procedure: LYMPHADENECTOMY;  Surgeon: Dutch Gray, MD;  Location: [facility];  Service: Urology;  Laterality: Bilateral;  ? ROBOT ASSISTED LAPAROSCOPIC RADICAL PROSTATECTOMY N/A 06/23/2012  ? Procedure: ROBOTIC ASSISTED LAPAROSCOPIC RADICAL PROSTATECTOMY LEVEL 2;  Surgeon: Dutch Gray, MD;  Location: [facility];  Service: Urology;  Laterality: N/A;  ? ? ?Social History  ? ?Socioeconomic History  ? Marital status: Married  ?  Spouse name: Not on file  ? Number of children: 3  ? Years of education: Not on file  ? Highest education level:  Not on file  ?Occupational History  ?  Comment: Sales  ?Tobacco Use  ? Smoking status: Never  ? Smokeless tobacco: Never  ?Substance and Sexual Activity  ? Alcohol use: Yes  ?  Alcohol/week: 5.0 standard drinks  ?  Types: 5 Cans of beer per week  ?  Comment: 2-3 beers per day  ? Drug use: No  ? Sexual activity: Not on file  ?Other Topics Concern  ? Not on file  ?Social History Narrative  ? Not on file  ? ?Social Determinants of Health  ? ?Financial Resource Strain: Not on file  ?Food Insecurity: Not on file  ?Transportation Needs: Not on file  ?Physical Activity: Not on file  ?Stress: Not on file  ?Social Connections: Not on file  ?Intimate Partner Violence: Not on file  ? ? ?Family History  ?Problem  Relation Age of Onset  ? Colon cancer Father   ? Esophageal cancer Neg Hx   ? Rectal cancer Neg Hx   ? Stomach cancer Neg Hx   ? ? ?ROS: no fevers or chills, productive cough, hemoptysis, dysphasia, odynophagia, melena, hematochezia, dysuria, hematuria, rash, seizure activity, orthopnea, PND, pedal edema, claudication. Remaining systems are negative. ? ?Physical Exam: ?Well-developed well-nourished in no acute distress.  ?Skin is warm and dry.  ?HEENT is normal.  ?Neck is supple.  ?Chest is clear to auscultation with normal expansion.  ?Cardiovascular exam is irregular ?Abdominal exam nontender or distended. No masses palpated. ?Extremities show no edema. ?neuro grossly intact ? ?ECG-atrial fibrillation at a rate of 65, normal axis, no ST changes.  Personally reviewed ? ?A/P ? ?1 permanent atrial fibrillation-continue atenolol for rate control.  Continue apixaban. ? ?2 coronary artery disease-patient doing well with no chest pain.  Continue statin.  She is not on aspirin given need for anticoagulation. ? ?3 thoracic aortic aneurysm-plan follow-up CTA April 2023. ? ?4 hypertension-blood pressure controlled.  Continue present medical regimen. ? ?5 hyperlipidemia-plan to continue Zocor and Zetia.  He did not tolerate Lipitor previously. ? ?6 history of aortic insufficiency-mild on most recent echocardiogram. ? ?Kirk Ruths, MD ? ? ? ?

## 2021-05-14 DIAGNOSIS — H40003 Preglaucoma, unspecified, bilateral: Secondary | ICD-10-CM | POA: Diagnosis not present

## 2021-05-16 ENCOUNTER — Ambulatory Visit (INDEPENDENT_AMBULATORY_CARE_PROVIDER_SITE_OTHER): Payer: Medicare Other | Admitting: Cardiology

## 2021-05-16 ENCOUNTER — Other Ambulatory Visit: Payer: Self-pay

## 2021-05-16 ENCOUNTER — Encounter: Payer: Self-pay | Admitting: Cardiology

## 2021-05-16 VITALS — BP 114/74 | HR 65 | Ht 79.0 in | Wt 241.0 lb

## 2021-05-16 DIAGNOSIS — E7800 Pure hypercholesterolemia, unspecified: Secondary | ICD-10-CM

## 2021-05-16 DIAGNOSIS — I359 Nonrheumatic aortic valve disorder, unspecified: Secondary | ICD-10-CM

## 2021-05-16 DIAGNOSIS — I2510 Atherosclerotic heart disease of native coronary artery without angina pectoris: Secondary | ICD-10-CM

## 2021-05-16 DIAGNOSIS — I10 Essential (primary) hypertension: Secondary | ICD-10-CM | POA: Diagnosis not present

## 2021-05-16 DIAGNOSIS — I4821 Permanent atrial fibrillation: Secondary | ICD-10-CM | POA: Diagnosis not present

## 2021-05-16 DIAGNOSIS — I712 Thoracic aortic aneurysm, without rupture, unspecified: Secondary | ICD-10-CM | POA: Diagnosis not present

## 2021-05-16 NOTE — Patient Instructions (Signed)
?  Testing/Procedures: ? ?CTA OF THE CHEST W/WO TO FOLLOW UP THORACIC ANEURYSM AT THE [facility] OFFICE ? ? ?Follow-Up: ?At [facility], you and your health needs are our priority.  As part of our continuing mission to provide you with exceptional heart care, we have created designated Provider Care Teams.  These Care Teams include your primary Cardiologist (physician) and Advanced Practice Providers (APPs -  Physician Assistants and Nurse Practitioners) who all work together to provide you with the care you need, when you need it. ? ?We recommend signing up for the patient portal called "MyChart".  Sign up information is provided on this After Visit Summary.  MyChart is used to connect with patients for Virtual Visits (Telemedicine).  Patients are able to view lab/test results, encounter notes, upcoming appointments, etc.  Non-urgent messages can be sent to your provider as well.   ?To learn more about what you can do with MyChart, go to [email or web address].   ? ?Your next appointment:   ?12 month(s) ? ?The format for your next appointment:   ?In Person ? ?Provider:   ?Kirk Ruths, MD   ? ? ? ?

## 2021-05-29 DIAGNOSIS — E7800 Pure hypercholesterolemia, unspecified: Secondary | ICD-10-CM | POA: Diagnosis not present

## 2021-05-29 DIAGNOSIS — I359 Nonrheumatic aortic valve disorder, unspecified: Secondary | ICD-10-CM | POA: Diagnosis not present

## 2021-05-29 DIAGNOSIS — I2510 Atherosclerotic heart disease of native coronary artery without angina pectoris: Secondary | ICD-10-CM | POA: Diagnosis not present

## 2021-05-29 DIAGNOSIS — I10 Essential (primary) hypertension: Secondary | ICD-10-CM | POA: Diagnosis not present

## 2021-05-29 DIAGNOSIS — I712 Thoracic aortic aneurysm, without rupture, unspecified: Secondary | ICD-10-CM | POA: Diagnosis not present

## 2021-05-29 DIAGNOSIS — I4821 Permanent atrial fibrillation: Secondary | ICD-10-CM | POA: Diagnosis not present

## 2021-06-02 ENCOUNTER — Encounter: Payer: Self-pay | Admitting: *Deleted

## 2021-06-09 ENCOUNTER — Ambulatory Visit (HOSPITAL_BASED_OUTPATIENT_CLINIC_OR_DEPARTMENT_OTHER)
Admission: RE | Admit: 2021-06-09 | Discharge: 2021-06-09 | Disposition: A | Discharge status: Home / Self Care | Payer: Medicare Other | Source: Ambulatory Visit | Attending: Cardiology | Admitting: Cardiology

## 2021-06-09 DIAGNOSIS — I712 Thoracic aortic aneurysm, without rupture, unspecified: Secondary | ICD-10-CM

## 2021-06-09 MED ORDER — IOHEXOL 350 MG/ML SOLN
100.0000 mL | Freq: Once | INTRAVENOUS | Status: AC | PRN
  Administered 2021-06-09: 65 mL via INTRAVENOUS

## 2021-06-10 ENCOUNTER — Encounter (HOSPITAL_BASED_OUTPATIENT_CLINIC_OR_DEPARTMENT_OTHER): Payer: Self-pay

## 2021-06-10 DIAGNOSIS — I7121 Aneurysm of the ascending aorta, without rupture: Secondary | ICD-10-CM | POA: Diagnosis not present

## 2021-06-10 LAB — POCT I-STAT CREATININE: Creatinine, Ser: 1.6 mg/dL — ABNORMAL HIGH (ref 0.61–1.24)

## 2021-06-11 ENCOUNTER — Other Ambulatory Visit: Payer: Self-pay | Admitting: Internal Medicine

## 2021-06-11 LAB — BASIC METABOLIC PANEL
BUN/Creatinine Ratio: 17 (ref 10–24)
BUN: 27 mg/dL (ref 8–27)
Calcium: 9.5 mg/dL (ref 8.6–10.2)
Chloride: 103 mmol/L (ref 96–106)
Creatinine, Ser: 1.59 mg/dL — ABNORMAL HIGH (ref 0.76–1.27)
Glucose: 108 mg/dL — ABNORMAL HIGH (ref 70–99)
Potassium: 4.9 mmol/L (ref 3.5–5.2)
Sodium: 140 mmol/L (ref 134–144)
eGFR: 45 mL/min/{1.73_m2} — ABNORMAL LOW (ref >59)

## 2021-06-11 MED ORDER — MECLIZINE HCL 25 MG PO TABS: ORAL_TABLET | ORAL | 0 refills | Status: AC

## 2021-06-28 DIAGNOSIS — Z23 Encounter for immunization: Secondary | ICD-10-CM | POA: Diagnosis not present

## 2021-07-22 ENCOUNTER — Encounter: Payer: Medicare Other | Admitting: Internal Medicine

## 2021-08-04 NOTE — Progress Notes (Signed)
ANNUAL PHYSICAL Assessment:   Diagnoses and all orders for this visit:  1. Encounter for general adult medical examination with abnormal findings Due annually  2. Paroxysmal atrial fibrillation (HCC) Controlled on atenolol, eliquis Followed by cardiology  - EKG 12-Lead  3. Primary hypertension Controlled  Continue medications; amlodipine, atenolol, hctz, olmesartan. Discussed DASH (Dietary Approaches to Stop Hypertension) DASH diet is lower in sodium than a typical American diet. Cut back on foods that are high in saturated fat, cholesterol, and trans fats. Eat more whole-grain foods, fish, poultry, and nuts Remain active and exercise as tolerated daily.  Monitor BP at home-Call if greater than 130/80.   - CBC with Differential/Platelet - COMPLETE METABOLIC PANEL WITH GFR  4. Atrial flutter, unspecified type ([place]) Rate controlled,  Continue atenolol, eliquis Followed by cardiology   5. Prostate cancer [facility]) S/p prostatectomy in 2014 Followed by Dr. [name]   6. Pure hypercholesterolemia Controlled Continue medications; ezetimibe, simvastatin. Discussed lifestyle modifications. Recommended diet heavy in fruits and veggies, omega 3's. Decrease consumption of animal meats, cheeses, and dairy products. Remain active and exercise as tolerated. Continue to monitor.  - Lipid panel  7. Stage 3a chronic kidney disease ([place]) Discussed how what you eat and drink can aide in kidney protection. Stay well hydrated. Avoid high salt foods. Avoid NSAIDS. Keep BP and BG well controlled.   Take medications as prescribed. Remain active and exercise as tolerated daily. Maintain weight.  Continue to monitor.  - Urinalysis, Routine w reflex microscopic  8. Glaucoma suspect, bilateral Controlled. Followed by [name], Opthalmology    9. Thoracic aortic aneurysm without rupture, unspecified part ([place]) CTA 05/2021 Completed. Continue to monitor.     10. Tortuous aorta  [facility]) Cardiology following Continue to monitor.  11. Coronary artery disease involving native coronary artery of native heart without angina pectoris Cardiology following. Continue medications, ezetimibe, simvastatin   12. Vitamin D deficiency At goal. Continue supplement. Continue to monitor.   13. Anxiety Controlled. Avoid triggers. Obtain adequate sleep. Stay well hydrated.   14. Abnormal glucose A1C at goal. Continue lifestyle modifications.   - Hemoglobin A1c  15. Overweight (BMI 25.0-29.9) Continue to remain active. Continue lifestyle modifications.  16. B12 deficiency Continue to  monitor.  - Vitamin B12  17. Medication management All medications discussed and reviewed in full. All questions and concerns regarding medications addressed.    - CBC with Differential/Platelet - COMPLETE METABOLIC PANEL WITH GFR - Lipid panel - Hemoglobin A1c - EKG 12-Lead - Urinalysis, Routine w reflex microscopic - Vitamin B12  Over 30 minutes of face to face exam, counseling, chart review, and critical decision making was performed.  Future Appointments  Date Time Provider [place]  09/05/2021 11:30 AM Liane Comber, NP [facility] None  08/06/2022 10:00 AM [name], NP [facility] None    Subjective:  Kenneth Russell is a 76 y.o. male who presents for Annual Physical for HTN, hyperlipidemia, glucose management, and vitamin D Def, cardiac disease.   Has recent hx of Thoracic aortic aneurysm  without rupture, CAD, AVD, with permanent atrial fibrillation and follows Dr. [name], Cardiology, last seen 04/2021.Patient previously followed by Dr. Adrian Prows in [facility].  Cardiac catheterization 2018 showed normal coronary arteries. CTA April 2021 showed no pulmonary embolus, dilated aortic root at 4.2 cm, nonobstructive coronary disease other than severe stenosis in ramus intermedius. Nuclear study May 2021 showed ejection fraction 51% and no ischemia  or infarction. Patient previously treated with flecainide for atrial fibrillation but this  was discontinued.  He has therefore been treated with rate control and anticoagulation, Atenolol and apixaban.  Last echocardiogram September 2021 showed ejection fraction 60 to 65%, mild left ventricular hypertrophy, severe left atrial enlargement, moderate right atrial enlargement.  CTA April 2022 showed ascending thoracic aorta at 4.3 cm, aortic atherosclerosis and coronary artery calcifications.  Denies any new or recent cardiac related issues.  He recently had CTA 05/2021.  He also follows with [name], Ophthamology for bilateral glaucoma.    Patient is S/p Radical Prostatectomy in 2014 by Dr. [name], follows annually without recurrent concerns.   He has recurrent mouth sores, takes valtrex PRN for flares.   Reports cutting a door in his garage approximately 6 mo ago that has left his hearing muffled.  He does not wish to pursue further work up.  BMI is Body mass index is 27.51 kg/m., he has been working on diet and exercise. Very active, exercises at least 60 min daily, cycling, weights, tennis, golf.  Wt Readings from Last 3 Encounters:  08/05/21 235 lb (106.6 kg)  05/16/21 241 lb (109.3 kg)  01/21/21 233 lb 3.2 oz (105.8 kg)   He had a Negative Cardiolite in 2007. In Jan 2018, he had a negative heart cath evaluating new onset pAfib/Flutter. He had several CV however with recurrence, now on atenolol and eliquis. Patient denies any cardiac symptoms. He golfs or exercises daily. Follows with Dr. [name] annually.   His blood pressure has been controlled at home, today their BP is BP: 100/60.  He continues Amlodipine, Atenolol, HCTZ, Olmesartan.   He does workout. He denies chest pain, shortness of breath, dizziness.   He is on cholesterol medication (simvastatin 40 mg daily) along with ezetimibe and denies myalgias. His cholesterol is at goal. The cholesterol last visit was:   Lab Results   Component Value Date   CHOL 165 12/27/2020   HDL 88 12/27/2020   LDLCALC 64 12/27/2020   TRIG 57 12/27/2020   CHOLHDL 1.9 12/27/2020   He has been working on diet and exercise for glucose management, and denies increased appetite, nausea, paresthesia of the feet, polydipsia, polyuria and visual disturbances. Last A1C in the office was:  Lab Results  Component Value Date   HGBA1C 5.0 12/27/2020   Last GFR, has CKD3a monitored and last OV: Lab Results  Component Value Date   GFRNONAA 57 (L) 07/18/2020   GFRNONAA 51 (L) 04/09/2020   GFRNONAA 53 (L) 12/12/2019   Patient is on Vitamin D supplement.   Lab Results  Component Value Date   VD25OH 60 12/27/2020     He has no overt anemia but note he was hyperchromic/macrocytic on recent CBC;  Lab Results  Component Value Date   WBC 4.7 12/27/2020   HGB 14.4 12/27/2020   HCT 42.5 12/27/2020   MCV 101.4 (H) 12/27/2020   MCH 34.4 (H) 12/27/2020   RDW 11.9 12/27/2020   PLT 163 12/27/2020   Hx of borderline def, discussed and plan to start SL supplement and recheck at follow up Lab Results  Component Value Date   VITAMINB12 380 08/29/2013     Medication Review:   Current Outpatient Medications (Cardiovascular):    amLODipine (NORVASC) 10 MG tablet, Take  1/2 to 1 tablet  Daily  for BP   atenolol (TENORMIN) 25 MG tablet, TAKE ONE TABLET BY MOUTH DAILY   ezetimibe (ZETIA) 10 MG tablet, Take  1 tablet  Daily  for Cholesterol                           /  TAKE 1 TABLET BY MOUTH DAILY   hydrochlorothiazide (HYDRODIURIL) 25 MG tablet, TAKE ONE TABLET BY MOUTH DAILY   olmesartan (BENICAR) 40 MG tablet, Take  1 tablet  at Bedtime for BP   simvastatin (ZOCOR) 40 MG tablet, Take  1 tablet  at Bedtime  for Cholesterol  /Patient knows to take by mouth  Current Outpatient Medications (Respiratory):    diphenhydrAMINE (BENADRYL) 25 mg capsule, Take 25 mg by mouth at bedtime as needed for sleep.  Current Outpatient Medications  (Analgesics):    ibuprofen (ADVIL) 200 MG tablet, Take by mouth.  Current Outpatient Medications (Hematological):    ELIQUIS 5 MG TABS tablet, TAKE ONE TABLET BY MOUTH TWICE A DAY  Current Outpatient Medications (Other):    Cholecalciferol (VITAMIN D3) 5000 UNITS CAPS, Take 5,000 Int'l Units by mouth daily.   Glucos-Chond-Hyal Ac-Ca Fructo (MOVE FREE JOINT HEALTH ADVANCE) TABS, Take 1 tablet by mouth every morning.   meclizine (ANTIVERT) 25 MG tablet, Take 1/2 to 1 tablet 2 to 3 x /day as needed for  "Sea Sickness", Dizziness / Vertigo   valACYclovir (VALTREX) 500 MG tablet, Take  1 tablet  Daily  to Prevent Fever Blisters  Allergies: Allergies  Allergen Reactions   Ultram [Tramadol] Anaphylaxis   Atorvastatin     Other reaction(s): Myalgias (intolerance)   Doxazosin Other (See Comments)    Dizziness    Telmisartan Hypertension and Other (See Comments)    Current Problems (verified) has Hyperlipidemia, LDL goal <50; Hypertension; Anxiety; Vitamin D deficiency; Prostate cancer ([place]); Medication management; Abnormal glucose; Overweight (BMI 25.0-29.9); Atrial flutter ([place]); Glaucoma suspect, bilateral; Paroxysmal atrial fibrillation (HCC); CKD (chronic kidney disease) stage 3, GFR 30-59 ml/min ([place]); Tortuous aorta ([place]); Thoracic aortic aneurysm without rupture ([place]); Coronary artery disease involving native coronary artery of native heart without angina pectoris; and B12 deficiency on their problem list.  Screening Tests Immunization History  Administered Date(s) Administered   Fluad Quad(high Dose 65+) 12/05/2019   Influenza, High Dose Seasonal PF 01/31/2015, 12/03/2017   Influenza-Unspecified 12/19/2015, 12/24/2016   MMR 02/23/2005   PFIZER Comirnaty(Gray Top)Covid-19 Tri-Sucrose Vaccine 03/19/2019, 04/11/2019   PFIZER(Purple Top)SARS-COV-2 Vaccination 03/19/2019, 04/11/2019, 10/27/2019, 06/01/2020   Pfizer Covid-19 Vaccine Bivalent Booster 5yr & up 11/24/2020, 06/28/2021    Pneumococcal Conjugate-13 01/31/2015   Pneumococcal Polysaccharide-23 02/23/2010, 03/30/2016   Tdap 02/23/2005    Preventative care: Last colonoscopy: 2014 due 2024  Prior vaccinations: TD or Tdap: 2007 DUE, get PRN due to cost Influenza: 2022 Pneumococcal: 2018 Prevnar13: 2016 Shingles/Zostavax: Unsure if he has updated-will check and return call to office Covid 19: 2/2, pfizer + booster   Names of Other Physician/Practitioners you currently use: 1. [facility] here for primary care 2. Dr. BEdilia Bo eye doctor, last visit 2022, monitoring glaucoma, goes annually, stable  3. Dr. BGloriann Loan dentist, last visit, last visit 08/2021, goes annually   Patient Care Team: MUnk Pinto MD as PCP - General (Internal Medicine) CStanford BreedBDenice Bors MD as PCP - Cardiology (Cardiology) BRaynelle Bring MD as Consulting Physician (Urology) KInda Castle MD (Inactive) as Consulting Physician (Gastroenterology) BSusa Day MD as Consulting Physician (Orthopedic Surgery) JJarome Matin MD as Consulting Physician (Dermatology) Bond, JTracie Harrier MD as Referring Physician (Ophthalmology) FEbbie Ridge MD as Referring Physician (Cardiology)  Surgical: He  has a past surgical history that includes Colonoscopy; Robot assisted laparoscopic radical prostatectomy (N/A, 06/23/2012); and Lymphadenectomy (Bilateral, 06/23/2012). Family His family history includes Colon cancer in his father. Social history  He reports  that he has never smoked. He has never used smokeless tobacco. He reports current alcohol use of about 5.0 standard drinks of alcohol per week. He reports that he does not use drugs.  Objective:   Today's Vitals   08/05/21 1007  BP: 100/60  Pulse: 67  Temp: (!) 96.6 F (35.9 C)  SpO2: 99%  Weight: 235 lb (106.6 kg)  Height: 6' 5.5" (1.969 m)     Body mass index is 27.51 kg/m.  General Appearance: Well nourished, in no apparent  distress. Eyes: PERRLA, EOMs, conjunctiva no swelling or erythema Sinuses: No Frontal/maxillary tenderness ENT/Mouth: Ext aud canals clear, TMs without erythema, bulging. No erythema, swelling, or exudate on post pharynx.  Tonsils not swollen or erythematous. Hearing normal.  Neck: Supple, thyroid normal.  Respiratory: Respiratory effort normal, BS without rales, rhonchi, wheezing or stridor.  Cardio: RRR with no MRGs. Brisk peripheral pulses without edema.  Abdomen: Soft, + BS.  Non tender, no guarding, rebound, hernias, masses. Lymphatics: Non tender without lymphadenopathy.  Musculoskeletal: Full ROM, 5/5 strength, normal gait.  Skin: Warm, dry without rashes, lesions, ecchymosis.  Neuro: Cranial nerves intact. Normal muscle tone, no cerebellar symptoms. Sensation intact.  Psych: Awake and oriented X 3, normal affect, Insight and Judgment appropriate.   EKG:  Atrial fibrillation    Bren Steers, NP   08/05/2021

## 2021-08-05 ENCOUNTER — Encounter: Payer: Self-pay | Admitting: Nurse Practitioner

## 2021-08-05 ENCOUNTER — Ambulatory Visit (INDEPENDENT_AMBULATORY_CARE_PROVIDER_SITE_OTHER): Payer: Medicare Other | Admitting: Nurse Practitioner

## 2021-08-05 VITALS — BP 100/60 | HR 67 | Temp 96.6°F | Ht 77.5 in | Wt 235.0 lb

## 2021-08-05 DIAGNOSIS — I480 Paroxysmal atrial fibrillation: Secondary | ICD-10-CM | POA: Diagnosis not present

## 2021-08-05 DIAGNOSIS — E538 Deficiency of other specified B group vitamins: Secondary | ICD-10-CM

## 2021-08-05 DIAGNOSIS — I771 Stricture of artery: Secondary | ICD-10-CM

## 2021-08-05 DIAGNOSIS — H40003 Preglaucoma, unspecified, bilateral: Secondary | ICD-10-CM

## 2021-08-05 DIAGNOSIS — Z79899 Other long term (current) drug therapy: Secondary | ICD-10-CM | POA: Diagnosis not present

## 2021-08-05 DIAGNOSIS — E663 Overweight: Secondary | ICD-10-CM

## 2021-08-05 DIAGNOSIS — E559 Vitamin D deficiency, unspecified: Secondary | ICD-10-CM

## 2021-08-05 DIAGNOSIS — Z136 Encounter for screening for cardiovascular disorders: Secondary | ICD-10-CM

## 2021-08-05 DIAGNOSIS — I4892 Unspecified atrial flutter: Secondary | ICD-10-CM

## 2021-08-05 DIAGNOSIS — N1831 Chronic kidney disease, stage 3a: Secondary | ICD-10-CM | POA: Diagnosis not present

## 2021-08-05 DIAGNOSIS — R7309 Other abnormal glucose: Secondary | ICD-10-CM

## 2021-08-05 DIAGNOSIS — F419 Anxiety disorder, unspecified: Secondary | ICD-10-CM

## 2021-08-05 DIAGNOSIS — Z0001 Encounter for general adult medical examination with abnormal findings: Secondary | ICD-10-CM

## 2021-08-05 DIAGNOSIS — I712 Thoracic aortic aneurysm, without rupture, unspecified: Secondary | ICD-10-CM

## 2021-08-05 DIAGNOSIS — I10 Essential (primary) hypertension: Secondary | ICD-10-CM

## 2021-08-05 DIAGNOSIS — E7800 Pure hypercholesterolemia, unspecified: Secondary | ICD-10-CM | POA: Diagnosis not present

## 2021-08-05 DIAGNOSIS — I2510 Atherosclerotic heart disease of native coronary artery without angina pectoris: Secondary | ICD-10-CM

## 2021-08-05 DIAGNOSIS — C61 Malignant neoplasm of prostate: Secondary | ICD-10-CM

## 2021-08-05 NOTE — Patient Instructions (Signed)

## 2021-08-06 ENCOUNTER — Encounter: Payer: Self-pay | Admitting: Nurse Practitioner

## 2021-08-06 HISTORY — DX: Gilbert syndrome: E80.4

## 2021-08-06 LAB — LIPID PANEL
Cholesterol: 172 mg/dL (ref <200)
HDL: 68 mg/dL (ref >=40)
LDL Cholesterol (Calc): 87 mg/dL (calc)
Non-HDL Cholesterol (Calc): 104 mg/dL (calc) (ref <130)
Total CHOL/HDL Ratio: 2.5 (calc) (ref <5.0)
Triglycerides: 82 mg/dL (ref <150)

## 2021-08-06 LAB — CBC WITH DIFFERENTIAL/PLATELET
Absolute Monocytes: 426 cells/uL (ref 200–950)
Basophils Absolute: 72 cells/uL (ref 0–200)
Basophils Relative: 1.9 %
Eosinophils Absolute: 194 cells/uL (ref 15–500)
Eosinophils Relative: 5.1 %
HCT: 40.2 % (ref 38.5–50.0)
Hemoglobin: 13.9 g/dL (ref 13.2–17.1)
Lymphs Abs: 1212 cells/uL (ref 850–3900)
MCH: 34.3 pg — ABNORMAL HIGH (ref 27.0–33.0)
MCHC: 34.6 g/dL (ref 32.0–36.0)
MCV: 99.3 fL (ref 80.0–100.0)
MPV: 9.6 fL (ref 7.5–12.5)
Monocytes Relative: 11.2 %
Neutro Abs: 1896 cells/uL (ref 1500–7800)
Neutrophils Relative %: 49.9 %
Platelets: 221 10*3/uL (ref 140–400)
RBC: 4.05 10*6/uL — ABNORMAL LOW (ref 4.20–5.80)
RDW: 12.1 % (ref 11.0–15.0)
Total Lymphocyte: 31.9 %
WBC: 3.8 10*3/uL (ref 3.8–10.8)

## 2021-08-06 LAB — COMPLETE METABOLIC PANEL WITH GFR
AG Ratio: 1.4 (calc) (ref 1.0–2.5)
ALT: 22 U/L (ref 9–46)
AST: 26 U/L (ref 10–35)
Albumin: 4.2 g/dL (ref 3.6–5.1)
Alkaline phosphatase (APISO): 61 U/L (ref 35–144)
BUN/Creatinine Ratio: 15 (calc) (ref 6–22)
BUN: 22 mg/dL (ref 7–25)
CO2: 28 mmol/L (ref 20–32)
Calcium: 9.5 mg/dL (ref 8.6–10.3)
Chloride: 101 mmol/L (ref 98–110)
Creat: 1.49 mg/dL — ABNORMAL HIGH (ref 0.70–1.28)
Globulin: 2.9 g/dL (calc) (ref 1.9–3.7)
Glucose, Bld: 87 mg/dL (ref 65–99)
Potassium: 4.7 mmol/L (ref 3.5–5.3)
Sodium: 138 mmol/L (ref 135–146)
Total Bilirubin: 2.1 mg/dL — ABNORMAL HIGH (ref 0.2–1.2)
Total Protein: 7.1 g/dL (ref 6.1–8.1)
eGFR: 48 mL/min/{1.73_m2} — ABNORMAL LOW (ref >=60)

## 2021-08-06 LAB — URINALYSIS, ROUTINE W REFLEX MICROSCOPIC
Bilirubin Urine: NEGATIVE
Glucose, UA: NEGATIVE
Hgb urine dipstick: NEGATIVE
Ketones, ur: NEGATIVE
Leukocytes,Ua: NEGATIVE
Nitrite: NEGATIVE
Protein, ur: NEGATIVE
Specific Gravity, Urine: 1.009 (ref 1.001–1.035)
pH: 6 (ref 5.0–8.0)

## 2021-08-06 LAB — HEMOGLOBIN A1C
Hgb A1c MFr Bld: 5.3 % of total Hgb (ref <5.7)
Mean Plasma Glucose: 105 mg/dL
eAG (mmol/L): 5.8 mmol/L

## 2021-08-06 LAB — VITAMIN B12: Vitamin B-12: 1771 pg/mL — ABNORMAL HIGH (ref 200–1100)

## 2021-08-11 ENCOUNTER — Other Ambulatory Visit: Payer: Self-pay

## 2021-08-11 MED ORDER — AMLODIPINE BESYLATE 10 MG PO TABS: ORAL_TABLET | ORAL | 1 refills | Status: DC

## 2021-08-15 DIAGNOSIS — C61 Malignant neoplasm of prostate: Secondary | ICD-10-CM | POA: Diagnosis not present

## 2021-08-29 ENCOUNTER — Other Ambulatory Visit: Payer: Self-pay

## 2021-08-29 DIAGNOSIS — C61 Malignant neoplasm of prostate: Secondary | ICD-10-CM | POA: Diagnosis not present

## 2021-08-29 DIAGNOSIS — N5201 Erectile dysfunction due to arterial insufficiency: Secondary | ICD-10-CM | POA: Diagnosis not present

## 2021-08-29 MED ORDER — SIMVASTATIN 40 MG PO TABS: ORAL_TABLET | ORAL | 3 refills | Status: DC

## 2021-08-31 ENCOUNTER — Other Ambulatory Visit: Payer: Self-pay | Admitting: Internal Medicine

## 2021-08-31 DIAGNOSIS — K120 Recurrent oral aphthae: Secondary | ICD-10-CM

## 2021-08-31 MED ORDER — VALACYCLOVIR HCL 500 MG PO TABS: ORAL_TABLET | ORAL | 3 refills | Status: AC

## 2021-09-05 ENCOUNTER — Ambulatory Visit: Payer: Medicare Other | Admitting: Adult Health

## 2021-11-13 ENCOUNTER — Encounter: Payer: Self-pay | Admitting: Nurse Practitioner

## 2021-11-13 ENCOUNTER — Ambulatory Visit (INDEPENDENT_AMBULATORY_CARE_PROVIDER_SITE_OTHER): Payer: Medicare Other | Admitting: Nurse Practitioner

## 2021-11-13 VITALS — Temp 97.5°F | Ht 77.5 in | Wt 237.0 lb

## 2021-11-13 DIAGNOSIS — E559 Vitamin D deficiency, unspecified: Secondary | ICD-10-CM | POA: Diagnosis not present

## 2021-11-13 DIAGNOSIS — Z8546 Personal history of malignant neoplasm of prostate: Secondary | ICD-10-CM | POA: Diagnosis not present

## 2021-11-13 DIAGNOSIS — I10 Essential (primary) hypertension: Secondary | ICD-10-CM

## 2021-11-13 DIAGNOSIS — H40003 Preglaucoma, unspecified, bilateral: Secondary | ICD-10-CM | POA: Diagnosis not present

## 2021-11-13 DIAGNOSIS — Z0001 Encounter for general adult medical examination with abnormal findings: Secondary | ICD-10-CM | POA: Diagnosis not present

## 2021-11-13 DIAGNOSIS — Z79899 Other long term (current) drug therapy: Secondary | ICD-10-CM | POA: Diagnosis not present

## 2021-11-13 DIAGNOSIS — I480 Paroxysmal atrial fibrillation: Secondary | ICD-10-CM

## 2021-11-13 DIAGNOSIS — R6889 Other general symptoms and signs: Secondary | ICD-10-CM | POA: Diagnosis not present

## 2021-11-13 DIAGNOSIS — F419 Anxiety disorder, unspecified: Secondary | ICD-10-CM | POA: Diagnosis not present

## 2021-11-13 DIAGNOSIS — Z0000 Encounter for general adult medical examination without abnormal findings: Secondary | ICD-10-CM

## 2021-11-13 DIAGNOSIS — K120 Recurrent oral aphthae: Secondary | ICD-10-CM

## 2021-11-13 DIAGNOSIS — E663 Overweight: Secondary | ICD-10-CM

## 2021-11-13 DIAGNOSIS — E7800 Pure hypercholesterolemia, unspecified: Secondary | ICD-10-CM

## 2021-11-13 DIAGNOSIS — N1831 Chronic kidney disease, stage 3a: Secondary | ICD-10-CM | POA: Diagnosis not present

## 2021-11-13 DIAGNOSIS — B0089 Other herpesviral infection: Secondary | ICD-10-CM

## 2021-11-13 DIAGNOSIS — R7309 Other abnormal glucose: Secondary | ICD-10-CM | POA: Diagnosis not present

## 2021-11-13 NOTE — Progress Notes (Signed)
MEDICARE ANNUAL WELLNESS VISIT AND FOLLOW UP Assessment:   Diagnoses and all orders for this visit:  Annual Medicare Wellness Visit Due annually  Health maintenance reviewed  Paroxysmal atrial fibrillation (Kenneth Russell) Controlled on atenolol,  Has stopped flecainide On elequis Followed by cardiology  Essential hypertension Discussed DASH (Dietary Approaches to Stop Hypertension) DASH diet is lower in sodium than a typical American diet. Cut back on foods that are high in saturated fat, cholesterol, and trans fats. Eat more whole-grain foods, fish, poultry, and nuts Remain active and exercise as tolerated daily.  Monitor BP at home-Call if greater than 130/80.  Check CMP/CBC   Hx of prostate cancer Kenneth Russell) S/p prostatectomy in 2014; followed by Dr. Marchia Russell Yearly  Vitamin D deficiency At goal at recent check; continue to recommend supplementation for goal of 60-100  Medication management All medications discussed and reviewed in full. All questions and concerns regarding medications addressed.     Mixed hyperlipidemia Discussed lifestyle modifications. Recommended diet heavy in fruits and veggies, omega 3's. Decrease consumption of animal meats, cheeses, and dairy products. Remain active and exercise as tolerated. Continue to monitor. Check lipids/TSH   Glaucoma suspect, bilateral Controlled Followed by Dr. Edilia Russell  BMI 27 Continue to recommend diet heavy in fruits and veggies and low in animal meats, cheeses, and dairy products, appropriate calorie intake Discuss exercise recommendations routinely Continue to monitor weight at each visit  Anxiety He uses xanax sporadically PRN; discssed risks and agreeable to d/c He will try benadryl; consider trazodone if needed Stress management techniques discussed, increase water, good sleep hygiene discussed, increase exercise, and increase veggies.   Abnormal glucose Education: Reviewed 'ABCs' of diabetes management   Discussed goals to be met and/or maintained include A1C (<7) Blood pressure (<130/80) Cholesterol (LDL <70) Continue Dental Exam Q6 mo Discussed dietary recommendations Discussed Physical Activity recommendations Check A1C  CKD (chronic kidney disease) stage 3, GFR 30-59 ml/min (HCC) Discussed how what you eat and drink can aide in kidney protection. Stay well hydrated. Avoid high salt foods. Avoid NSAIDS. Keep BP and BG well controlled.   Take medications as prescribed. Remain active and exercise as tolerated daily. Maintain weight.  Continue to monitor. Check CMP/GFR/Microablumin   Cold sores/Herpes simplex virus type 1 Valtrex PRN flare  Aphthouus stomatitis Resolved   Gilberts Syndrome/ familial non haemolytic jaundice Monitor LFTs; bilirubin  Orders Placed This Encounter  Procedures   CBC with Differential/Platelet   COMPLETE METABOLIC PANEL WITH GFR    Notify office for further evaluation and treatment, questions or concerns if any reported s/s fail to improve.   The patient was advised to call back or seek an in-person evaluation if any symptoms worsen or if the condition fails to improve as anticipated.   Further disposition pending results of labs. Discussed med's effects and SE's.    I discussed the assessment and treatment plan with the patient. The patient was provided an opportunity to ask questions and all were answered. The patient agreed with the plan and demonstrated an understanding of the instructions.  Discussed med's effects and SE's. Screening labs and tests as requested with regular follow-up as recommended.  I provided 30 minutes of face-to-face time during this encounter including counseling, chart review, and critical decision making was preformed.   Future Appointments  Date Time Provider [name]  08/06/2022 10:00 AM Kenneth Jump, NP [facility] None  11/16/2022 11:00 AM Kenneth Jump, NP [facility] None     Plan:    During the course of  the visit the patient was educated and counseled about appropriate screening and preventive services including:   Pneumococcal vaccine  Influenza vaccine Prevnar 13 Td vaccine Screening electrocardiogram Colorectal cancer screening Diabetes screening Glaucoma screening Nutrition counseling    Subjective:  Kenneth Russell is a 76 y.o. male who presents for Medicare Annual Wellness Visit and 3 month follow up for HTN, HLDhyperlipidemia, glucose management, and vitamin D Def.   Patient is S/p Radical Prostatectomy in 2014 by Dr. Alinda Russell, follows annually without recurrent concerns.   He has recurrent mouth sores, takes valtrex PRN for flares.   Anxiety, xanax 1 mg, takes rarely at night for sleep, 30 tabs last almost a full year. He also takes benadryl which does work well. Discussed risks with xanax and he is receptive to discontinuing.   BMI is Body mass index is 27.74 kg/m., he has been working on diet and exercise. Very active, exercises at least 60 min daily, cycling, weights, tennis, golf.  Wt Readings from Last 3 Encounters:  11/13/21 237 lb (107.5 kg)  08/05/21 235 lb (106.6 kg)  05/16/21 241 lb (109.3 kg)   He had a Negative Cardiolite in 2007. In Jan 2018, he had a negative heart cath evaluating new onset pAfib/Flutter. He had several CV however with recurrence, now on atenolol and eliquis. Patient denies any cardiac symptoms. He golfs or exercises daily. Follows with Dr. Stanford Russell annually.   His blood pressure has been controlled at home, today their BP is   He does workout. He denies chest pain, shortness of breath, dizziness.   He is on cholesterol medication (simvastatin 40 mg daily) and denies myalgias. His cholesterol is at goal. The cholesterol last visit was:   Lab Results  Component Value Date   CHOL 172 08/05/2021   HDL 68 08/05/2021   LDLCALC 87 08/05/2021   TRIG 82 08/05/2021   CHOLHDL 2.5 08/05/2021   He has been working on diet  and exercise for glucose management, and denies increased appetite, nausea, paresthesia of the feet, polydipsia, polyuria and visual disturbances. Last A1C in the office was:  Lab Results  Component Value Date   HGBA1C 5.3 08/05/2021   Last GFR, has CKD3a monitored and last OV: Lab Results  Component Value Date   GFRNONAA 57 (L) 07/18/2020   GFRNONAA 51 (L) 04/09/2020   GFRNONAA 53 (L) 12/12/2019   Patient is on Vitamin D supplement.   Lab Results  Component Value Date   VD25OH 60 12/27/2020     He has no overt anemia but note he was hyperchromic/macrocytic on recent CBC;  Lab Results  Component Value Date   WBC 3.8 08/05/2021   HGB 13.9 08/05/2021   HCT 40.2 08/05/2021   MCV 99.3 08/05/2021   MCH 34.3 (H) 08/05/2021   RDW 12.1 08/05/2021   PLT 221 08/05/2021   Hx of borderline def, discussed and plan to start SL supplement and recheck at follow up Lab Results  Component Value Date   VITAMINB12 1,771 (H) 08/05/2021     Medication Review:   Current Outpatient Medications (Cardiovascular):    amLODipine (NORVASC) 10 MG tablet, Take 1/2 to 1 tablet daily for blood pressure.   atenolol (TENORMIN) 25 MG tablet, TAKE ONE TABLET BY MOUTH DAILY   ezetimibe (ZETIA) 10 MG tablet, Take  1 tablet  Daily  for Cholesterol                           /  TAKE 1 TABLET BY MOUTH DAILY   hydrochlorothiazide (HYDRODIURIL) 25 MG tablet, TAKE ONE TABLET BY MOUTH DAILY   olmesartan (BENICAR) 40 MG tablet, Take  1 tablet  at Bedtime for BP   simvastatin (ZOCOR) 40 MG tablet, Take 1 tablet at Bedtime for Cholesterol  Current Outpatient Medications (Respiratory):    diphenhydrAMINE (BENADRYL) 25 mg capsule, Take 25 mg by mouth at bedtime as needed for sleep.  Current Outpatient Medications (Analgesics):    ibuprofen (ADVIL) 200 MG tablet, Take by mouth.  Current Outpatient Medications (Hematological):    ELIQUIS 5 MG TABS tablet, TAKE ONE TABLET BY MOUTH TWICE A DAY  Current Outpatient  Medications (Other):    Cholecalciferol (VITAMIN D3) 5000 UNITS CAPS, Take 5,000 Int'l Units by mouth daily.   Glucos-Chond-Hyal Ac-Ca Fructo (MOVE FREE JOINT HEALTH ADVANCE) TABS, Take 1 tablet by mouth every morning.   meclizine (ANTIVERT) 25 MG tablet, Take 1/2 to 1 tablet 2 to 3 x /day as needed for  "Sea Sickness", Dizziness / Vertigo   valACYclovir (VALTREX) 500 MG tablet, Take  1 tablet  Daily  to Prevent Fever Blisters                                       /                                TAKE                               BY                      MOUTH  Allergies: Allergies  Allergen Reactions   Ultram [Tramadol] Anaphylaxis   Atorvastatin     Other reaction(s): Myalgias (intolerance)   Doxazosin Other (See Comments)    Dizziness    Telmisartan Hypertension and Other (See Comments)    Current Problems (verified) has Hyperlipidemia, LDL goal <50; Hypertension; Anxiety; Vitamin D deficiency; Prostate cancer ([place]); Medication management; Abnormal glucose; Overweight (BMI 25.0-29.9); Atrial flutter ([place]); Glaucoma suspect, bilateral; Paroxysmal atrial fibrillation (HCC); CKD (chronic kidney disease) stage 3, GFR 30-59 ml/min ([place]); Tortuous aorta ([place]); Thoracic aortic aneurysm without rupture ([place]); Coronary artery disease involving native coronary artery of native heart without angina pectoris; B12 deficiency; and Gilbert's syndrome on their problem list.  Screening Tests Immunization History  Administered Date(s) Administered   Fluad Quad(high Dose 65+) 12/05/2019   Influenza, High Dose Seasonal PF 01/31/2015, 12/03/2017   Influenza-Unspecified 12/19/2015, 12/24/2016   MMR 02/23/2005   PFIZER Comirnaty(Gray Top)Covid-19 Tri-Sucrose Vaccine 03/19/2019, 04/11/2019   PFIZER(Purple Top)SARS-COV-2 Vaccination 03/19/2019, 04/11/2019, 10/27/2019, 06/01/2020   Pfizer Covid-19 Vaccine Bivalent Booster 9yr & up 11/24/2020, 06/28/2021   Pneumococcal Conjugate-13 01/31/2015    Pneumococcal Polysaccharide-23 02/23/2010, 03/30/2016   Tdap 02/23/2005    Preventative care: Last colonoscopy: 2014 due 2024  Prior vaccinations: TD or Tdap: 2007 DUE, get PRN due to cost Influenza: Due 11/2021 Pneumococcal: 2018 Prevnar13: 2016 Shingles/Zostavax: check with insurance  Covid 19: 2/2, pfizer + booster   Names of Other Physician/Practitioners you currently use: 1. [facility] here for primary care 2. Dr. BEdilia Russell eye doctor, last visit 2022, monitoring glaucoma, goes annually, stable  3. Dr. BGloriann Loan dentist, last visit, last visit 08/2020,  goes annually   Patient Care Team: Unk Pinto, MD as PCP - General (Internal Medicine) Kenneth Russell Denice Bors, MD as PCP - Cardiology (Cardiology) Raynelle Bring, MD as Consulting Physician (Urology) Inda Castle, MD (Inactive) as Consulting Physician (Gastroenterology) Susa Day, MD as Consulting Physician (Orthopedic Surgery) Jarome Matin, MD as Consulting Physician (Dermatology) Bond, Tracie Harrier, MD as Referring Physician (Ophthalmology) Ebbie Ridge, MD as Referring Physician (Cardiology)  Surgical: He  has a past surgical history that includes Colonoscopy; Robot assisted laparoscopic radical prostatectomy (N/A, 06/23/2012); and Lymphadenectomy (Bilateral, 06/23/2012). Family His family history includes Colon cancer in his father. Social history  He reports that he has never smoked. He has never used smokeless tobacco. He reports current alcohol use of about 5.0 standard drinks of alcohol per week. He reports that he does not use drugs.  MEDICARE WELLNESS OBJECTIVES: Physical activity:   Cardiac risk factors:   Depression/mood screen:      11/13/2021    5:37 AM  Depression screen PHQ 2/9  Decreased Interest 0  Down, Depressed, Hopeless 0  PHQ - 2 Score 0    ADLs:     11/13/2021   10:06 AM 11/13/2021    5:36 AM  In your present state of health, do you have any  difficulty performing the following activities:  Hearing? 0 0  Vision? 0 0  Difficulty concentrating or making decisions? 0 0  Walking or climbing stairs? 0 0  Dressing or bathing? 0 0  Doing errands, shopping? 0 0  Preparing Food and eating ? N   Using the Toilet? N   In the past six months, have you accidently leaked urine? N   Do you have problems with loss of bowel control? N   Managing your Medications? N   Managing your Finances? N   Housekeeping or managing your Housekeeping? N      Cognitive Testing  Alert? Yes  Normal Appearance?Yes  Oriented to person? Yes  Place? Yes   Time? Yes  Recall of three objects?  Yes  Can perform simple calculations? Yes  Displays appropriate judgment?Yes  Can read the correct time from a watch face?Yes  EOL planning:     Objective:   Today's Vitals   11/13/21 0932  Temp: (!) 97.5 F (36.4 C)  SpO2: 98%  Weight: 237 lb (107.5 kg)  Height: 6' 5.5" (1.969 m)     Body mass index is 27.74 kg/m.  General Appearance: Well nourished, in no apparent distress. Eyes: PERRLA, EOMs, conjunctiva no swelling or erythema Sinuses: No Frontal/maxillary tenderness ENT/Mouth: Ext aud canals clear, TMs without erythema, bulging. No erythema, swelling, or exudate on post pharynx.  Tonsils not swollen or erythematous. Hearing normal.  Neck: Supple, thyroid normal.  Respiratory: Respiratory effort normal, BS without rales, rhonchi, wheezing or stridor.  Cardio: RRR with no MRGs. Brisk peripheral pulses without edema.  Abdomen: Soft, + BS.  Non tender, no guarding, rebound, hernias, masses. Lymphatics: Non tender without lymphadenopathy.  Musculoskeletal: Full ROM, 5/5 strength, normal gait.  Skin: Warm, dry without rashes, lesions, ecchymosis.  Neuro: Cranial nerves intact. Normal muscle tone, no cerebellar symptoms. Sensation intact.  Psych: Awake and oriented X 3, normal affect, Insight and Judgment appropriate.   Medicare Attestation I have  personally reviewed: The patient's medical and social history Their use of alcohol, tobacco or illicit drugs Their current medications and supplements The patient's functional ability including ADLs,fall risks, home safety risks, cognitive, and hearing and visual impairment Diet and physical activities  Evidence for depression or mood disorders  The patient's weight, height, BMI, and visual acuity have been recorded in the chart.  I have made referrals, counseling, and provided education to the patient based on review of the above and I have provided the patient with a written personalized care plan for preventive services.     Kenneth Jump, NP   11/13/2021

## 2021-11-13 NOTE — Patient Instructions (Signed)

## 2021-11-14 LAB — CBC WITH DIFFERENTIAL/PLATELET
Absolute Monocytes: 457 cells/uL (ref 200–950)
Basophils Absolute: 79 cells/uL (ref 0–200)
Basophils Relative: 2.2 %
Eosinophils Absolute: 328 cells/uL (ref 15–500)
Eosinophils Relative: 9.1 %
HCT: 39.9 % (ref 38.5–50.0)
Hemoglobin: 13.7 g/dL (ref 13.2–17.1)
Lymphs Abs: 1048 cells/uL (ref 850–3900)
MCH: 34.6 pg — ABNORMAL HIGH (ref 27.0–33.0)
MCHC: 34.3 g/dL (ref 32.0–36.0)
MCV: 100.8 fL — ABNORMAL HIGH (ref 80.0–100.0)
MPV: 9.4 fL (ref 7.5–12.5)
Monocytes Relative: 12.7 %
Neutro Abs: 1688 cells/uL (ref 1500–7800)
Neutrophils Relative %: 46.9 %
Platelets: 181 10*3/uL (ref 140–400)
RBC: 3.96 10*6/uL — ABNORMAL LOW (ref 4.20–5.80)
RDW: 12.1 % (ref 11.0–15.0)
Total Lymphocyte: 29.1 %
WBC: 3.6 10*3/uL — ABNORMAL LOW (ref 3.8–10.8)

## 2021-11-14 LAB — COMPLETE METABOLIC PANEL WITH GFR
AG Ratio: 1.7 (calc) (ref 1.0–2.5)
ALT: 19 U/L (ref 9–46)
AST: 27 U/L (ref 10–35)
Albumin: 4.4 g/dL (ref 3.6–5.1)
Alkaline phosphatase (APISO): 54 U/L (ref 35–144)
BUN: 23 mg/dL (ref 7–25)
CO2: 27 mmol/L (ref 20–32)
Calcium: 9.2 mg/dL (ref 8.6–10.3)
Chloride: 104 mmol/L (ref 98–110)
Creat: 1.28 mg/dL (ref 0.70–1.28)
Globulin: 2.6 g/dL (calc) (ref 1.9–3.7)
Glucose, Bld: 86 mg/dL (ref 65–99)
Potassium: 4.1 mmol/L (ref 3.5–5.3)
Sodium: 139 mmol/L (ref 135–146)
Total Bilirubin: 2 mg/dL — ABNORMAL HIGH (ref 0.2–1.2)
Total Protein: 7 g/dL (ref 6.1–8.1)
eGFR: 58 mL/min/{1.73_m2} — ABNORMAL LOW (ref >=60)

## 2021-11-20 ENCOUNTER — Other Ambulatory Visit: Payer: Self-pay | Admitting: Cardiology

## 2021-11-20 DIAGNOSIS — H40003 Preglaucoma, unspecified, bilateral: Secondary | ICD-10-CM | POA: Diagnosis not present

## 2021-12-13 DIAGNOSIS — H40003 Preglaucoma, unspecified, bilateral: Secondary | ICD-10-CM | POA: Diagnosis not present

## 2021-12-23 ENCOUNTER — Other Ambulatory Visit: Payer: Self-pay | Admitting: Cardiology

## 2021-12-23 ENCOUNTER — Other Ambulatory Visit: Payer: Self-pay

## 2021-12-23 DIAGNOSIS — I480 Paroxysmal atrial fibrillation: Secondary | ICD-10-CM

## 2021-12-23 MED ORDER — EZETIMIBE 10 MG PO TABS
ORAL_TABLET | ORAL | 3 refills | Status: DC
Start: 2021-12-23 — End: 2022-12-25

## 2021-12-23 NOTE — Telephone Encounter (Signed)
Eliquis '5mg'$  refill request received. Patient is 76 years old, weight-107.5kg, Crea-1.28 on 11/13/2021, Diagnosis-Afib, and last seen by Dr. [name] on 05/16/2021. Dose is appropriate based on dosing criteria. Will send in refill to requested pharmacy.

## 2022-01-26 DIAGNOSIS — L821 Other seborrheic keratosis: Secondary | ICD-10-CM | POA: Diagnosis not present

## 2022-01-26 DIAGNOSIS — L812 Freckles: Secondary | ICD-10-CM | POA: Diagnosis not present

## 2022-01-26 DIAGNOSIS — D1723 Benign lipomatous neoplasm of skin and subcutaneous tissue of right leg: Secondary | ICD-10-CM | POA: Diagnosis not present

## 2022-01-26 DIAGNOSIS — D485 Neoplasm of uncertain behavior of skin: Secondary | ICD-10-CM | POA: Diagnosis not present

## 2022-01-26 DIAGNOSIS — D1801 Hemangioma of skin and subcutaneous tissue: Secondary | ICD-10-CM | POA: Diagnosis not present

## 2022-01-26 DIAGNOSIS — Z85828 Personal history of other malignant neoplasm of skin: Secondary | ICD-10-CM | POA: Diagnosis not present

## 2022-01-31 DIAGNOSIS — Z23 Encounter for immunization: Secondary | ICD-10-CM | POA: Diagnosis not present

## 2022-02-23 ENCOUNTER — Other Ambulatory Visit: Payer: Self-pay | Admitting: Internal Medicine

## 2022-02-23 MED ORDER — OLMESARTAN MEDOXOMIL 40 MG PO TABS: ORAL_TABLET | ORAL | 3 refills | Status: DC

## 2022-02-28 ENCOUNTER — Other Ambulatory Visit: Payer: Self-pay | Admitting: Nurse Practitioner

## 2022-04-13 DIAGNOSIS — Z85828 Personal history of other malignant neoplasm of skin: Secondary | ICD-10-CM | POA: Diagnosis not present

## 2022-04-13 DIAGNOSIS — D485 Neoplasm of uncertain behavior of skin: Secondary | ICD-10-CM | POA: Diagnosis not present

## 2022-04-22 ENCOUNTER — Encounter: Payer: Self-pay | Admitting: Gastroenterology

## 2022-05-06 ENCOUNTER — Other Ambulatory Visit: Payer: Self-pay | Admitting: *Deleted

## 2022-05-06 ENCOUNTER — Encounter: Payer: Self-pay | Admitting: *Deleted

## 2022-05-06 DIAGNOSIS — I712 Thoracic aortic aneurysm, without rupture, unspecified: Secondary | ICD-10-CM

## 2022-06-03 ENCOUNTER — Telehealth: Payer: Self-pay | Admitting: Cardiology

## 2022-06-03 DIAGNOSIS — I712 Thoracic aortic aneurysm, without rupture, unspecified: Secondary | ICD-10-CM

## 2022-06-03 NOTE — Telephone Encounter (Signed)
Left detailed message to come in for BMET this week Monday at the latest. BMET entered

## 2022-06-03 NOTE — Telephone Encounter (Signed)
Wife is calling to see if patient needs to have blood work done before the CT. Please advise

## 2022-06-04 DIAGNOSIS — I712 Thoracic aortic aneurysm, without rupture, unspecified: Secondary | ICD-10-CM | POA: Diagnosis not present

## 2022-06-05 LAB — BASIC METABOLIC PANEL
BUN/Creatinine Ratio: 16 (ref 10–24)
BUN: 18 mg/dL (ref 8–27)
CO2: 25 mmol/L (ref 20–29)
Calcium: 9.5 mg/dL (ref 8.6–10.2)
Chloride: 100 mmol/L (ref 96–106)
Creatinine, Ser: 1.15 mg/dL (ref 0.76–1.27)
Glucose: 100 mg/dL — ABNORMAL HIGH (ref 70–99)
Potassium: 4 mmol/L (ref 3.5–5.2)
Sodium: 138 mmol/L (ref 134–144)
eGFR: 66 mL/min/{1.73_m2} (ref >59)

## 2022-06-10 ENCOUNTER — Encounter: Payer: Self-pay | Admitting: Cardiology

## 2022-06-11 ENCOUNTER — Ambulatory Visit
Admission: RE | Admit: 2022-06-11 | Discharge: 2022-06-11 | Disposition: A | Discharge status: Home / Self Care | Payer: Medicare Other | Source: Ambulatory Visit | Attending: Cardiology | Admitting: Cardiology

## 2022-06-11 DIAGNOSIS — I2510 Atherosclerotic heart disease of native coronary artery without angina pectoris: Secondary | ICD-10-CM | POA: Diagnosis not present

## 2022-06-11 DIAGNOSIS — J8410 Pulmonary fibrosis, unspecified: Secondary | ICD-10-CM | POA: Diagnosis not present

## 2022-06-11 DIAGNOSIS — I712 Thoracic aortic aneurysm, without rupture, unspecified: Secondary | ICD-10-CM

## 2022-06-11 DIAGNOSIS — K8020 Calculus of gallbladder without cholecystitis without obstruction: Secondary | ICD-10-CM | POA: Diagnosis not present

## 2022-06-11 MED ORDER — IOPAMIDOL (ISOVUE-370) INJECTION 76%
75.0000 mL | Freq: Once | INTRAVENOUS | Status: AC | PRN
  Administered 2022-06-11: 75 mL via INTRAVENOUS

## 2022-06-15 ENCOUNTER — Encounter: Payer: Self-pay | Admitting: *Deleted

## 2022-07-01 NOTE — Progress Notes (Signed)
HPI: FU atrial fibrillation and coronary artery disease.  Patient previously followed by Dr. Clydie Braun in [facility] Cardiac catheterization 2018 showed normal coronary arteries. CTA April 2021 showed no pulmonary embolus, dilated aortic root at 4.2 cm, nonobstructive coronary disease other than severe stenosis in ramus intermedius. Nuclear study May 2021 showed ejection fraction 51% and no ischemia or infarction. Patient previously treated with flecainide for atrial fibrillation but this was discontinued.  He has therefore been treated with rate control and anticoagulation. Last echocardiogram September 2021 showed ejection fraction 60 to 65%, mild left ventricular hypertrophy, severe left atrial enlargement, moderate right atrial enlargement, mild AI.  CTA April 2024 showed 4.4 cm ascending thoracic aortic aneurysm and coronary calcification.  Since last seen, there is no dyspnea, chest pain, palpitations or syncope.  He does occasionally have dizziness as blood pressure runs mildly low.  Current Outpatient Medications  Medication Sig Dispense Refill   amLODipine (NORVASC) 10 MG tablet TAKE 1/2 TO 1 TABLET BY MOUTH DAILY FOR BLOOD PRESSURE 90 tablet 1   apixaban (ELIQUIS) 5 MG TABS tablet TAKE ONE TABLET BY MOUTH TWICE A DAY 180 tablet 1   atenolol (TENORMIN) 25 MG tablet TAKE ONE TABLET BY MOUTH DAILY 90 tablet 3   Cholecalciferol (VITAMIN D3) 5000 UNITS CAPS Take 5,000 Int'l Units by mouth daily.     diphenhydrAMINE (BENADRYL) 25 mg capsule Take 25 mg by mouth at bedtime as needed for sleep.     ezetimibe (ZETIA) 10 MG tablet Take  1 tablet  Daily  for Cholesterol                           /      TAKE 1 TABLET BY MOUTH DAILY 90 tablet 3   Glucos-Chond-Hyal Ac-Ca Fructo (MOVE FREE JOINT HEALTH ADVANCE) TABS Take 1 tablet by mouth every morning.     hydrochlorothiazide (HYDRODIURIL) 25 MG tablet TAKE ONE TABLET BY MOUTH DAILY 90 tablet 3   ibuprofen (ADVIL) 200 MG tablet Take by mouth.      meclizine (ANTIVERT) 25 MG tablet Take 1/2 to 1 tablet 2 to 3 x /day as needed for  "Sea Sickness", Dizziness / Vertigo 90 tablet 0   olmesartan (BENICAR) 40 MG tablet Take  1 tablet  at Bedtime for BP 90 tablet 3   simvastatin (ZOCOR) 40 MG tablet Take 1 tablet at Bedtime for Cholesterol 90 tablet 3   valACYclovir (VALTREX) 500 MG tablet Take  1 tablet  Daily  to Prevent Fever Blisters                                       /                                TAKE                               BY                      MOUTH 90 tablet 3   No current facility-administered medications for this visit.     Past Medical History:  Diagnosis Date   Anxiety    Coronary artery disease  Hyperlipidemia    Hypertension    ICH (intracerebral hemorrhage) (HCC)    Other testicular hypofunction    Permanent atrial fibrillation (HCC)    Prostate cancer (HCC)    Vitamin D deficiency     Past Surgical History:  Procedure Laterality Date   COLONOSCOPY     LYMPHADENECTOMY Bilateral 06/23/2012   Procedure: LYMPHADENECTOMY;  Surgeon: Crecencio Mc, MD;  Location: [facility];  Service: Urology;  Laterality: Bilateral;   ROBOT ASSISTED LAPAROSCOPIC RADICAL PROSTATECTOMY N/A 06/23/2012   Procedure: ROBOTIC ASSISTED LAPAROSCOPIC RADICAL PROSTATECTOMY LEVEL 2;  Surgeon: Crecencio Mc, MD;  Location: [facility];  Service: Urology;  Laterality: N/A;    Social History   Socioeconomic History   Marital status: Married    Spouse name: Not on file   Number of children: 3   Years of education: Not on file   Highest education level: Not on file  Occupational History    Comment: Sales  Tobacco Use   Smoking status: Never   Smokeless tobacco: Never  Substance and Sexual Activity   Alcohol use: Yes    Alcohol/week: 5.0 standard drinks of alcohol    Types: 5 Cans of beer per week    Comment: 2-3 beers per day   Drug use: No   Sexual activity: Not on file  Other Topics Concern   Not on file  Social History Narrative    Not on file   Social Determinants of Health   Financial Resource Strain: Not on file  Food Insecurity: Not on file  Transportation Needs: Not on file  Physical Activity: Not on file  Stress: Not on file  Social Connections: Not on file  Intimate Partner Violence: Not on file    Family History  Problem Relation Age of Onset   Colon cancer Father    Esophageal cancer Neg Hx    Rectal cancer Neg Hx    Stomach cancer Neg Hx     ROS: no fevers or chills, productive cough, hemoptysis, dysphasia, odynophagia, melena, hematochezia, dysuria, hematuria, rash, seizure activity, orthopnea, PND, pedal edema, claudication. Remaining systems are negative.  Physical Exam: Well-developed well-nourished in no acute distress.  Skin is warm and dry.  HEENT is normal.  Neck is supple.  Chest is clear to auscultation with normal expansion.  Cardiovascular exam is irregular Abdominal exam nontender or distended. No masses palpated. Extremities show no edema. neuro grossly intact  ECG-atrial fibrillation at a rate of 63, no ST changes.  Personally reviewed  A/P  1 permanent atrial fibrillation-will continue atenolol for rate control.  Continue apixaban.  2 thoracic aortic aneurysm-patient will need follow-up CTA April 2025.  3 coronary artery disease-he denies chest pain.  Continue statin.  He is not on aspirin given need for apixaban.  4 hypertension-blood pressure controlled.  However at times he feels dizzy and his blood pressure is mildly decreased at home.  Will discontinue amlodipine to see if his symptoms improve.  Follow blood pressure and adjust medications as needed.  5 hyperlipidemia-continue simvastatin and Zetia.  He previously did not tolerate Lipitor.  6 aortic insufficiency-mild on most recent echocardiogram.  Will likely repeat echocardiogram in 1 year.  Olga Millers, MD

## 2022-07-08 DIAGNOSIS — R2242 Localized swelling, mass and lump, left lower limb: Secondary | ICD-10-CM | POA: Diagnosis not present

## 2022-07-08 DIAGNOSIS — R2240 Localized swelling, mass and lump, unspecified lower limb: Secondary | ICD-10-CM

## 2022-07-08 HISTORY — DX: Localized swelling, mass and lump, unspecified lower limb: R22.40

## 2022-07-10 ENCOUNTER — Ambulatory Visit: Payer: Medicare Other | Attending: Cardiology | Admitting: Cardiology

## 2022-07-10 ENCOUNTER — Encounter: Payer: Self-pay | Admitting: Cardiology

## 2022-07-10 VITALS — BP 108/62 | HR 63 | Ht 79.0 in | Wt 232.8 lb

## 2022-07-10 DIAGNOSIS — I359 Nonrheumatic aortic valve disorder, unspecified: Secondary | ICD-10-CM | POA: Diagnosis not present

## 2022-07-10 DIAGNOSIS — I4821 Permanent atrial fibrillation: Secondary | ICD-10-CM | POA: Insufficient documentation

## 2022-07-10 DIAGNOSIS — E7800 Pure hypercholesterolemia, unspecified: Secondary | ICD-10-CM | POA: Diagnosis not present

## 2022-07-10 DIAGNOSIS — I10 Essential (primary) hypertension: Secondary | ICD-10-CM | POA: Insufficient documentation

## 2022-07-10 DIAGNOSIS — I2510 Atherosclerotic heart disease of native coronary artery without angina pectoris: Secondary | ICD-10-CM | POA: Diagnosis not present

## 2022-07-10 DIAGNOSIS — I712 Thoracic aortic aneurysm, without rupture, unspecified: Secondary | ICD-10-CM | POA: Diagnosis not present

## 2022-07-10 NOTE — Patient Instructions (Signed)
Medication Instructions:  Stop Amlodipine Continue all other medications *If you need a refill on your cardiac medications before your next appointment, please call your pharmacy*   Lab Work: None ordered   Testing/Procedures: None ordered   Follow-Up: At [facility], you and your health needs are our priority.  As part of our continuing mission to provide you with exceptional heart care, we have created designated Provider Care Teams.  These Care Teams include your primary Cardiologist (physician) and Advanced Practice Providers (APPs -  Physician Assistants and Nurse Practitioners) who all work together to provide you with the care you need, when you need it.  We recommend signing up for the patient portal called "MyChart".  Sign up information is provided on this After Visit Summary.  MyChart is used to connect with patients for Virtual Visits (Telemedicine).  Patients are able to view lab/test results, encounter notes, upcoming appointments, etc.  Non-urgent messages can be sent to your provider as well.   To learn more about what you can do with MyChart, go to [email or web address].    Your next appointment:  1  year    Call in Feb to schedule May appointment     Provider:  Winfield Rast

## 2022-07-11 ENCOUNTER — Encounter: Payer: Self-pay | Admitting: Cardiology

## 2022-07-19 ENCOUNTER — Other Ambulatory Visit: Payer: Self-pay | Admitting: Cardiology

## 2022-07-19 DIAGNOSIS — I480 Paroxysmal atrial fibrillation: Secondary | ICD-10-CM

## 2022-07-21 NOTE — Telephone Encounter (Signed)
Pt last saw Dr Jens Som 07/10/22, last labs 06/04/22 Creat 1.15, age 77, weight 105.6kg, based on specified criteria pt is on appropriate dosage of Eliquis 5mg  BID for afib.  Will refill rx.

## 2022-08-05 ENCOUNTER — Encounter: Payer: Medicare Other | Admitting: Nurse Practitioner

## 2022-08-06 ENCOUNTER — Encounter: Payer: Self-pay | Admitting: Nurse Practitioner

## 2022-08-06 ENCOUNTER — Ambulatory Visit (INDEPENDENT_AMBULATORY_CARE_PROVIDER_SITE_OTHER): Payer: PRIVATE HEALTH INSURANCE | Admitting: Nurse Practitioner

## 2022-08-06 VITALS — BP 112/78 | HR 53 | Temp 97.8°F | Ht 76.5 in | Wt 231.8 lb

## 2022-08-06 DIAGNOSIS — F419 Anxiety disorder, unspecified: Secondary | ICD-10-CM

## 2022-08-06 DIAGNOSIS — N1831 Chronic kidney disease, stage 3a: Secondary | ICD-10-CM

## 2022-08-06 DIAGNOSIS — H40003 Preglaucoma, unspecified, bilateral: Secondary | ICD-10-CM

## 2022-08-06 DIAGNOSIS — I10 Essential (primary) hypertension: Secondary | ICD-10-CM | POA: Diagnosis not present

## 2022-08-06 DIAGNOSIS — R7309 Other abnormal glucose: Secondary | ICD-10-CM | POA: Diagnosis not present

## 2022-08-06 DIAGNOSIS — E538 Deficiency of other specified B group vitamins: Secondary | ICD-10-CM

## 2022-08-06 DIAGNOSIS — Z1329 Encounter for screening for other suspected endocrine disorder: Secondary | ICD-10-CM | POA: Diagnosis not present

## 2022-08-06 DIAGNOSIS — I480 Paroxysmal atrial fibrillation: Secondary | ICD-10-CM

## 2022-08-06 DIAGNOSIS — E663 Overweight: Secondary | ICD-10-CM | POA: Diagnosis not present

## 2022-08-06 DIAGNOSIS — E559 Vitamin D deficiency, unspecified: Secondary | ICD-10-CM | POA: Diagnosis not present

## 2022-08-06 DIAGNOSIS — Z79899 Other long term (current) drug therapy: Secondary | ICD-10-CM

## 2022-08-06 DIAGNOSIS — Z0001 Encounter for general adult medical examination with abnormal findings: Secondary | ICD-10-CM

## 2022-08-06 DIAGNOSIS — Z8619 Personal history of other infectious and parasitic diseases: Secondary | ICD-10-CM

## 2022-08-06 DIAGNOSIS — E7800 Pure hypercholesterolemia, unspecified: Secondary | ICD-10-CM | POA: Diagnosis not present

## 2022-08-06 DIAGNOSIS — I771 Stricture of artery: Secondary | ICD-10-CM

## 2022-08-06 DIAGNOSIS — Z1389 Encounter for screening for other disorder: Secondary | ICD-10-CM | POA: Diagnosis not present

## 2022-08-06 DIAGNOSIS — Z8546 Personal history of malignant neoplasm of prostate: Secondary | ICD-10-CM | POA: Diagnosis not present

## 2022-08-06 DIAGNOSIS — C61 Malignant neoplasm of prostate: Secondary | ICD-10-CM | POA: Diagnosis not present

## 2022-08-06 DIAGNOSIS — K120 Recurrent oral aphthae: Secondary | ICD-10-CM

## 2022-08-06 NOTE — Progress Notes (Signed)
CPE Assessment:   Kenneth Russell is here for an annual CPE:  Diagnoses and all orders for this visit:  Annual CPE Due annually  Health maintenance reviewed Healthy lifestyle goals set  Paroxysmal atrial fibrillation (HCC) Continue Atenolol, Eliquis Has stopped flecainide Followed by cardiology  Essential hypertension Controlled Stop Amlodipine Continue Atenolol, HCTZ, Olmesartan Discussed DASH (Dietary Approaches to Stop Hypertension) DASH diet is lower in sodium than a typical American diet. Cut back on foods that are high in saturated fat, cholesterol, and trans fats. Eat more whole-grain foods, fish, poultry, and nuts Remain active and exercise as tolerated daily.  Monitor BP at home-Call if greater than 130/80.  Check CMP/CBC  Hx of prostate cancer [facility]) S/p prostatectomy in 2014 Monitor PSA levels   Vitamin D deficiency At goal at recent check; continue to recommend supplementation for goal of 60-100  Medication management All medications discussed and reviewed in full. All questions and concerns regarding medications addressed.     Mixed hyperlipidemia Continue Simvastatin Discussed lifestyle modifications. Recommended diet heavy in fruits and veggies, omega 3's. Decrease consumption of animal meats, cheeses, and dairy products. Remain active and exercise as tolerated. Continue to monitor. Check lipids/TSH  Glaucoma suspect, bilateral Controlled Continue to follow with Opthalmology  BMI 27 Discussed appropriate BMI Diet modification. Physical activity. Encouraged/praised to build confidence.  Anxiety Controlled Stress management techniques discussed, increase water, good sleep hygiene discussed, increase exercise, and increase veggies.   Abnormal glucose Education: Reviewed 'ABCs' of diabetes management  Discussed goals to be met and/or maintained include A1C (<7) Blood pressure (<130/80) Cholesterol (LDL <70) Continue Dental Exam Q6  mo Discussed dietary recommendations Discussed Physical Activity recommendations Check A1C  CKD (chronic kidney disease) stage 3, GFR 30-59 ml/min (HCC) Discussed how what you eat and drink can aide in kidney protection. Stay well hydrated. Avoid high salt foods. Avoid NSAIDS. Keep BP and BG well controlled.   Take medications as prescribed. Remain active and exercise as tolerated daily. Maintain weight.  Continue to monitor. Check CMP/GFR/Microablumin  Cold sores/Herpes simplex virus type 1 Valtrex PRN   Aphthouus stomatitis Resolved   Gilberts Syndrome/ familial non haemolytic jaundice Monitor LFTs; bilirubin  Tortuous aorta Continue to monitor  Orders Placed This Encounter  Procedures   CBC with Differential/Platelet   COMPLETE METABOLIC PANEL WITH GFR   Magnesium   Lipid panel   TSH   Hemoglobin A1c   Insulin, random   VITAMIN D 25 Hydroxy (Vit-D Deficiency, Fractures)   Urinalysis, Routine w reflex microscopic   Microalbumin / creatinine urine ratio   PSA    Notify office for further evaluation and treatment, questions or concerns if any reported s/s fail to improve.   The patient was advised to call back or seek an in-person evaluation if any symptoms worsen or if the condition fails to improve as anticipated.   Further disposition pending results of labs. Discussed med's effects and SE's.    I discussed the assessment and treatment plan with the patient. The patient was provided an opportunity to ask questions and all were answered. The patient agreed with the plan and demonstrated an understanding of the instructions.  Discussed med's effects and SE's. Screening labs and tests as requested with regular follow-up as recommended.  I provided 35 minutes of face-to-face time during this encounter including counseling, chart review, and critical decision making was preformed.  Today's Plan of Care is based on a patient-centered health care approach known as  shared decision making - the decisions,  tests and treatments allow for patient preferences and values to be balanced with clinical evidence.    Future Appointments  Date Time Provider Department Center  11/16/2022 11:00 AM Adela Glimpse, NP [facility] None  08/06/2023 10:00 AM Adela Glimpse, NP [facility] None     Plan:   During the course of the visit the patient was educated and counseled about appropriate screening and preventive services including:   Pneumococcal vaccine  Influenza vaccine Prevnar 13 Td vaccine Screening electrocardiogram Colorectal cancer screening Diabetes screening Glaucoma screening Nutrition counseling    Subjective:  Kenneth Russell is a 77 y.o. male who presents for annual CPE Visit and 3 month follow up for HTN, HLDhyperlipidemia, glucose management, and vitamin D Def.   Overall he reports feeling well today.  He has just returned from [place] where he was able to watch is oldest grandson Kenneth Russell, retail.  He is planning a trip to the beach with his wife next week to celebrate their 53rd wedding anniversary.   Patient is S/p Radical Prostatectomy in 2014 by Dr. Laverle Patter.  He has recurrent mouth sores, takes valtrex PRN for flares.   Has hx of anxiety, currently well managed and well controlled.    BMI is Body mass index is 27.85 kg/m., he has been working on diet and exercise. Very active, exercises at least 60 min daily, cycling, weights, tennis, golf.  Wt Readings from Last 3 Encounters:  08/06/22 231 lb 12.8 oz (105.1 kg)  07/10/22 232 lb 12.8 oz (105.6 kg)  11/13/21 237 lb (107.5 kg)   He had a Negative Cardiolite in 2007. In Jan 2018, he had a negative heart cath evaluating new onset pAfib/Flutter. He had several CV however with recurrence, now on atenolol and eliquis. Patient denies any cardiac symptoms. He golfs or exercises daily. Follows with Dr. Jens Som annually.   His blood pressure has been controlled at home, today their BP is BP:  112/78  He decreased Amlodipine from 10 mg to 5 mg.  BP still averaging <130/80. He does workout. He denies chest pain, shortness of breath, dizziness.   He is on cholesterol medication (simvastatin 40 mg daily) and denies myalgias. His cholesterol is at goal. The cholesterol last visit was:   Lab Results  Component Value Date   CHOL 172 08/05/2021   HDL 68 08/05/2021   LDLCALC 87 08/05/2021   TRIG 82 08/05/2021   CHOLHDL 2.5 08/05/2021   He has been working on diet and exercise for glucose management, and denies increased appetite, nausea, paresthesia of the feet, polydipsia, polyuria and visual disturbances. Last A1C in the office was:  Lab Results  Component Value Date   HGBA1C 5.3 08/05/2021   Last GFR, has CKD3a monitored and last OV: Lab Results  Component Value Date   GFRNONAA 57 (L) 07/18/2020   GFRNONAA 51 (L) 04/09/2020   GFRNONAA 53 (L) 12/12/2019   Patient is on Vitamin D supplement.   Lab Results  Component Value Date   VD25OH 60 12/27/2020     He has no overt anemia but note he was hyperchromic/macrocytic on recent CBC;  Lab Results  Component Value Date   WBC 3.6 (L) 11/13/2021   HGB 13.7 11/13/2021   HCT 39.9 11/13/2021   MCV 100.8 (H) 11/13/2021   MCH 34.6 (H) 11/13/2021   RDW 12.1 11/13/2021   PLT 181 11/13/2021   Hx of borderline def, monitor PRN Lab Results  Component Value Date   VITAMINB12 1,771 (H) 08/05/2021  Medication Review:   Current Outpatient Medications (Cardiovascular):    amLODipine (NORVASC) 5 MG tablet, Take 5 mg by mouth daily.   atenolol (TENORMIN) 25 MG tablet, TAKE ONE TABLET BY MOUTH DAILY   ezetimibe (ZETIA) 10 MG tablet, Take  1 tablet  Daily  for Cholesterol                           /      TAKE 1 TABLET BY MOUTH DAILY   hydrochlorothiazide (HYDRODIURIL) 25 MG tablet, TAKE ONE TABLET BY MOUTH DAILY   olmesartan (BENICAR) 40 MG tablet, Take  1 tablet  at Bedtime for BP   simvastatin (ZOCOR) 40 MG tablet, Take 1  tablet at Bedtime for Cholesterol  Current Outpatient Medications (Respiratory):    diphenhydrAMINE (BENADRYL) 25 mg capsule, Take 25 mg by mouth at bedtime as needed for sleep.  Current Outpatient Medications (Analgesics):    ibuprofen (ADVIL) 200 MG tablet, Take by mouth.  Current Outpatient Medications (Hematological):    apixaban (ELIQUIS) 5 MG TABS tablet, TAKE 1 TABLET BY MOUTH TWICE A DAY  Current Outpatient Medications (Other):    Cholecalciferol (VITAMIN D3) 5000 UNITS CAPS, Take 5,000 Int'l Units by mouth daily.   Glucos-Chond-Hyal Ac-Ca Fructo (MOVE FREE JOINT HEALTH ADVANCE) TABS, Take 1 tablet by mouth every morning.   meclizine (ANTIVERT) 25 MG tablet, Take 1/2 to 1 tablet 2 to 3 x /day as needed for  "Sea Sickness", Dizziness / Vertigo   valACYclovir (VALTREX) 500 MG tablet, Take  1 tablet  Daily  to Prevent Fever Blisters                                       /                                TAKE                               BY                      MOUTH  Allergies: Allergies  Allergen Reactions   Ultram [Tramadol] Anaphylaxis   Atorvastatin     Other reaction(s): Myalgias (intolerance)   Doxazosin Other (See Comments)    Dizziness     Current Problems (verified) has Hyperlipidemia, LDL goal <50; Hypertension; Anxiety; Vitamin D deficiency; Prostate cancer (HCC); Medication management; Abnormal glucose; Overweight (BMI 25.0-29.9); Atrial flutter (HCC); Glaucoma suspect, bilateral; Paroxysmal atrial fibrillation (HCC); CKD (chronic kidney disease) stage 3, GFR 30-59 ml/min (HCC); Tortuous aorta (HCC); Thoracic aortic aneurysm without rupture (HCC); Coronary artery disease involving native coronary artery of native heart without angina pectoris; B12 deficiency; and Gilbert's syndrome on their problem list.  Screening Tests Immunization History  Administered Date(s) Administered   Fluad Quad(high Dose 65+) 12/05/2019   Influenza, High Dose Seasonal PF 01/31/2015,  12/03/2017   Influenza-Unspecified 12/19/2015, 12/24/2016   MMR 02/23/2005   PFIZER Comirnaty(Gray Top)Covid-19 Tri-Sucrose Vaccine 03/19/2019, 04/11/2019   PFIZER(Purple Top)SARS-COV-2 Vaccination 03/19/2019, 04/11/2019, 10/27/2019, 06/01/2020   Pfizer Covid-19 Vaccine Bivalent Booster 10yrs & up 11/24/2020, 06/28/2021   Pneumococcal Conjugate-13 01/31/2015   Pneumococcal Polysaccharide-23 02/23/2010, 03/30/2016   Tdap 02/23/2005    Preventative care: Last colonoscopy: 2014  Done  Prior vaccinations: TD or Tdap: 2007 DUE, get PRN due to cost Influenza: Due 11/2021 Pneumococcal: 2018 Prevnar13: 2016 Shingles/Zostavax: check with insurance  Covid 19: 2/2, pfizer + booster   Names of Other Physician/Practitioners you currently use: 1. [facility] here for primary care 2. Dr. Lottie Dawson, eye doctor, last visit 2024, monitoring glaucoma, goes annually, stable  3. Dr. Alvester Morin, dentist, last visit, last visit 08/2021, goes annually  4.  Follows Dermatology yearly - no concerns.  Patient Care Team: Lucky Cowboy, MD as PCP - General (Internal Medicine) Jens Som Madolyn Frieze, MD as PCP - Cardiology (Cardiology) Heloise Purpura, MD as Consulting Physician (Urology) Louis Meckel, MD (Inactive) as Consulting Physician (Gastroenterology) Jene Every, MD as Consulting Physician (Orthopedic Surgery) Donzetta Starch, MD as Consulting Physician (Dermatology) Bond, Doran Stabler, MD as Referring Physician (Ophthalmology) Gayla Doss, MD as Referring Physician (Cardiology)  Surgical: He  has a past surgical history that includes Colonoscopy; Robot assisted laparoscopic radical prostatectomy (N/A, 06/23/2012); and Lymphadenectomy (Bilateral, 06/23/2012). Family His family history includes Colon cancer in his father. Social history  He reports that he has never smoked. He has never used smokeless tobacco. He reports current alcohol use of about 5.0 standard  drinks of alcohol per week. He reports that he does not use drugs.     Objective:   Today's Vitals   08/06/22 1002  BP: 112/78  Pulse: (!) 53  Temp: 97.8 F (36.6 C)  SpO2: 98%  Weight: 231 lb 12.8 oz (105.1 kg)  Height: 6' 4.5" (1.943 m)     Body mass index is 27.85 kg/m.  General Appearance: Well nourished, in no apparent distress. Eyes: PERRLA, EOMs, conjunctiva no swelling or erythema Sinuses: No Frontal/maxillary tenderness ENT/Mouth: Ext aud canals clear, TMs without erythema, bulging. No erythema, swelling, or exudate on post pharynx.  Tonsils not swollen or erythematous. Hearing normal.  Neck: Supple, thyroid normal.  Respiratory: Respiratory effort normal, BS without rales, rhonchi, wheezing or stridor.  Cardio: RRR with no MRGs. Brisk peripheral pulses without edema.  Abdomen: Soft, + BS.  Non tender, no guarding, rebound, hernias, masses. Lymphatics: Non tender without lymphadenopathy.  Musculoskeletal: Full ROM, 5/5 strength, normal gait.  Skin: Warm, dry without rashes, lesions, ecchymosis.  Neuro: Cranial nerves intact. Normal muscle tone, no cerebellar symptoms. Sensation intact.  Psych: Awake and oriented X 3, normal affect, Insight and Judgment appropriate.   EKG:  Completed by Cardiology Dr. Jens Som 06/2022 - A-Fib Rate of 63 No ST changes.     Adela Glimpse, NP   08/06/2022

## 2022-08-06 NOTE — Patient Instructions (Signed)

## 2022-08-07 LAB — PSA: PSA: <0.04 ng/mL (ref <=4.00)

## 2022-08-07 LAB — LIPID PANEL
Cholesterol: 161 mg/dL (ref <200)
HDL: 80 mg/dL (ref >=40)
LDL Cholesterol (Calc): 68 mg/dL (calc)
Non-HDL Cholesterol (Calc): 81 mg/dL (calc) (ref <130)
Total CHOL/HDL Ratio: 2 (calc) (ref <5.0)
Triglycerides: 52 mg/dL (ref <150)

## 2022-08-07 LAB — CBC WITH DIFFERENTIAL/PLATELET
Absolute Monocytes: 488 cells/uL (ref 200–950)
Basophils Absolute: 70 cells/uL (ref 0–200)
Basophils Relative: 1.7 %
Eosinophils Absolute: 258 cells/uL (ref 15–500)
Eosinophils Relative: 6.3 %
HCT: 41.1 % (ref 38.5–50.0)
Hemoglobin: 14 g/dL (ref 13.2–17.1)
Lymphs Abs: 1238 cells/uL (ref 850–3900)
MCH: 33.9 pg — ABNORMAL HIGH (ref 27.0–33.0)
MCHC: 34.1 g/dL (ref 32.0–36.0)
MCV: 99.5 fL (ref 80.0–100.0)
MPV: 9.5 fL (ref 7.5–12.5)
Monocytes Relative: 11.9 %
Neutro Abs: 2046 cells/uL (ref 1500–7800)
Neutrophils Relative %: 49.9 %
Platelets: 186 10*3/uL (ref 140–400)
RBC: 4.13 10*6/uL — ABNORMAL LOW (ref 4.20–5.80)
RDW: 12.1 % (ref 11.0–15.0)
Total Lymphocyte: 30.2 %
WBC: 4.1 10*3/uL (ref 3.8–10.8)

## 2022-08-07 LAB — COMPLETE METABOLIC PANEL WITH GFR
AG Ratio: 1.7 (calc) (ref 1.0–2.5)
ALT: 19 U/L (ref 9–46)
AST: 26 U/L (ref 10–35)
Albumin: 4.3 g/dL (ref 3.6–5.1)
Alkaline phosphatase (APISO): 58 U/L (ref 35–144)
BUN: 21 mg/dL (ref 7–25)
CO2: 28 mmol/L (ref 20–32)
Calcium: 9.2 mg/dL (ref 8.6–10.3)
Chloride: 103 mmol/L (ref 98–110)
Creat: 1.24 mg/dL (ref 0.70–1.28)
Globulin: 2.6 g/dL (calc) (ref 1.9–3.7)
Glucose, Bld: 101 mg/dL — ABNORMAL HIGH (ref 65–99)
Potassium: 4.4 mmol/L (ref 3.5–5.3)
Sodium: 140 mmol/L (ref 135–146)
Total Bilirubin: 2 mg/dL — ABNORMAL HIGH (ref 0.2–1.2)
Total Protein: 6.9 g/dL (ref 6.1–8.1)
eGFR: 60 mL/min/{1.73_m2} (ref >=60)

## 2022-08-07 LAB — MICROALBUMIN / CREATININE URINE RATIO
Creatinine, Urine: 71 mg/dL (ref 20–320)
Microalb Creat Ratio: 44 mg/g creat — ABNORMAL HIGH (ref <30)
Microalb, Ur: 3.1 mg/dL

## 2022-08-07 LAB — HEMOGLOBIN A1C
Hgb A1c MFr Bld: 5.4 % of total Hgb (ref <5.7)
Mean Plasma Glucose: 108 mg/dL
eAG (mmol/L): 6 mmol/L

## 2022-08-07 LAB — INSULIN, RANDOM: Insulin: 6.7 u[IU]/mL

## 2022-08-07 LAB — URINALYSIS, ROUTINE W REFLEX MICROSCOPIC
Bilirubin Urine: NEGATIVE
Glucose, UA: NEGATIVE
Hgb urine dipstick: NEGATIVE
Ketones, ur: NEGATIVE
Leukocytes,Ua: NEGATIVE
Nitrite: NEGATIVE
Protein, ur: NEGATIVE
Specific Gravity, Urine: 1.01 (ref 1.001–1.035)
pH: 6 (ref 5.0–8.0)

## 2022-08-07 LAB — MAGNESIUM: Magnesium: 2 mg/dL (ref 1.5–2.5)

## 2022-08-07 LAB — VITAMIN D 25 HYDROXY (VIT D DEFICIENCY, FRACTURES): Vit D, 25-Hydroxy: 69 ng/mL (ref 30–100)

## 2022-08-07 LAB — TSH: TSH: 1.24 mIU/L (ref 0.40–4.50)

## 2022-08-20 ENCOUNTER — Other Ambulatory Visit: Payer: Self-pay | Admitting: Nurse Practitioner

## 2022-08-24 ENCOUNTER — Other Ambulatory Visit: Payer: Self-pay

## 2022-08-24 NOTE — Progress Notes (Signed)
Refill request from the pharmacy for Amlodipine. Patient no longer takes that.

## 2022-08-26 DIAGNOSIS — R2242 Localized swelling, mass and lump, left lower limb: Secondary | ICD-10-CM | POA: Diagnosis not present

## 2022-08-31 ENCOUNTER — Other Ambulatory Visit: Payer: Self-pay

## 2022-08-31 DIAGNOSIS — M79672 Pain in left foot: Secondary | ICD-10-CM | POA: Diagnosis not present

## 2022-08-31 MED ORDER — AMLODIPINE BESYLATE 5 MG PO TABS: 5.0000 mg | ORAL_TABLET | Freq: Every day | ORAL | 2 refills | Status: DC

## 2022-09-02 DIAGNOSIS — R2242 Localized swelling, mass and lump, left lower limb: Secondary | ICD-10-CM | POA: Diagnosis not present

## 2022-09-04 ENCOUNTER — Telehealth: Payer: Self-pay | Admitting: *Deleted

## 2022-09-04 DIAGNOSIS — C61 Malignant neoplasm of prostate: Secondary | ICD-10-CM | POA: Diagnosis not present

## 2022-09-04 NOTE — Telephone Encounter (Signed)
   [facility] Pre-operative Risk Assessment    Patient Name: ZEDRIC MONTEE  DOB: 18-Apr-1945 MRN: [id number]  HEARTCARE STAFF:  - IMPORTANT!!!!!! Under Visit Info/Reason for Call, type in Other and utilize the format Clearance MM/DD/YY or Clearance TBD. Do not use dashes or single digits. - Please review there is not already an duplicate clearance open for this procedure. - If request is for dental extraction, please clarify the # of teeth to be extracted. - If the patient is currently at the dentist's office, call Pre-Op Callback Staff (MA/nurse) to input urgent request.  - If the patient is not currently in the dentist office, please route to the Pre-Op pool.  Request for surgical clearance:  What type of surgery is being performed? Excisional biopsy of left dorsal foot mass   When is this surgery scheduled? TBD  What type of clearance is required (medical clearance vs. Pharmacy clearance to hold med vs. Both)? Both   Are there any medications that need to be held prior to surgery and how long? Eliquis  Practice name and name of physician performing surgery? [facility], Dr Victorino Dike  What is the office phone number? [phone]   7.   What is the office fax number? [phone] Attn Kenn File  8.   Anesthesia type (None, local, MAC, general) ? General   Faith Branan L 09/04/2022, 11:20 AM  _________________________________________________________________   (provider comments below)

## 2022-09-04 NOTE — Telephone Encounter (Signed)
Patient with diagnosis of afib on Eliquis for anticoagulation.    Procedure: Excisional biopsy of left dorsal foot mass  Date of procedure: TBD  CHA2DS2-VASc Score = 4  This indicates a 4.8% annual risk of stroke. The patient's score is based upon: CHF History: 0 HTN History: 1 Diabetes History: 0 Stroke History: 0 Vascular Disease History: 1 Age Score: 2 Gender Score: 0   CrCl 61mL/min Platelet count 186K  Per office protocol, patient can hold Eliquis for 2 days prior to procedure.    **This guidance is not considered finalized until pre-operative APP has relayed final recommendations.**

## 2022-09-08 ENCOUNTER — Other Ambulatory Visit (HOSPITAL_COMMUNITY): Payer: Self-pay | Admitting: Orthopedic Surgery

## 2022-09-08 NOTE — Telephone Encounter (Signed)
     Primary Cardiologist: Olga Millers, MD  Chart reviewed as part of pre-operative protocol coverage. Given past medical history and time since last visit, based on ACC/AHA guidelines, Kenneth Russell would be at acceptable risk for the planned procedure without further cardiovascular testing.   His RCRI is a class I risk, 0.4% risk of major cardiac event.  Patient with diagnosis of afib on Eliquis for anticoagulation.     Procedure: Excisional biopsy of left dorsal foot mass  Date of procedure: TBD   CHA2DS2-VASc Score = 4  This indicates a 4.8% annual risk of stroke. The patient's score is based upon: CHF History: 0 HTN History: 1 Diabetes History: 0 Stroke History: 0 Vascular Disease History: 1 Age Score: 2 Gender Score: 0   CrCl 65mL/min Platelet count 186K   Per office protocol, patient can hold Eliquis for 2 days prior to procedure.  I will route this recommendation to the requesting party via Epic fax function and remove from pre-op pool.  Please call with questions.  Thomasene Ripple. Laksh Hinners NP-C     09/08/2022, 10:28 AM [facility] [address] Office [phone] Fax [phone]

## 2022-09-11 DIAGNOSIS — C61 Malignant neoplasm of prostate: Secondary | ICD-10-CM | POA: Diagnosis not present

## 2022-09-11 DIAGNOSIS — N5201 Erectile dysfunction due to arterial insufficiency: Secondary | ICD-10-CM | POA: Diagnosis not present

## 2022-09-28 ENCOUNTER — Encounter (HOSPITAL_BASED_OUTPATIENT_CLINIC_OR_DEPARTMENT_OTHER): Payer: Self-pay | Admitting: Orthopedic Surgery

## 2022-09-28 ENCOUNTER — Encounter (HOSPITAL_BASED_OUTPATIENT_CLINIC_OR_DEPARTMENT_OTHER)
Admission: RE | Admit: 2022-09-28 | Discharge: 2022-09-28 | Disposition: A | Discharge status: Home / Self Care | Payer: Medicare Other | Source: Ambulatory Visit | Attending: Orthopedic Surgery | Admitting: Orthopedic Surgery

## 2022-09-28 DIAGNOSIS — Z01812 Encounter for preprocedural laboratory examination: Secondary | ICD-10-CM | POA: Insufficient documentation

## 2022-09-28 LAB — BASIC METABOLIC PANEL
Anion gap: 9 (ref 5–15)
BUN: 19 mg/dL (ref 8–23)
CO2: 27 mmol/L (ref 22–32)
Calcium: 8.9 mg/dL (ref 8.9–10.3)
Chloride: 101 mmol/L (ref 98–111)
Creatinine, Ser: 1.31 mg/dL — ABNORMAL HIGH (ref 0.61–1.24)
GFR, Estimated: 56 mL/min — ABNORMAL LOW (ref >60)
Glucose, Bld: 103 mg/dL — ABNORMAL HIGH (ref 70–99)
Potassium: 4.8 mmol/L (ref 3.5–5.1)
Sodium: 137 mmol/L (ref 135–145)

## 2022-09-28 NOTE — Progress Notes (Signed)

## 2022-09-30 NOTE — H&P (Cosign Needed)
Kenneth Russell is an 77 y.o. male.   Chief Complaint: L foot mass HPI: Patient is a 77 year old male here for excisional biopsy of left dorsal foot mass that has become larger and more symptomatic.   Allergies:  Allergies  Allergen Reactions   Ultram [Tramadol] Anaphylaxis   Atorvastatin     Other reaction(s): Myalgias (intolerance)   Doxazosin Other (See Comments)    Dizziness     Past Medical History:  Diagnosis Date   Anxiety    Coronary artery disease    Hyperlipidemia    Hypertension    ICH (intracerebral hemorrhage) (HCC)    Other testicular hypofunction    Permanent atrial fibrillation (HCC)    Prostate cancer (HCC)    Vitamin D deficiency     Past Surgical History:  Procedure Laterality Date   COLONOSCOPY     LYMPHADENECTOMY Bilateral 06/23/2012   Procedure: LYMPHADENECTOMY;  Surgeon: Crecencio Mc, MD;  Location: [facility];  Service: Urology;  Laterality: Bilateral;   ROBOT ASSISTED LAPAROSCOPIC RADICAL PROSTATECTOMY N/A 06/23/2012   Procedure: ROBOTIC ASSISTED LAPAROSCOPIC RADICAL PROSTATECTOMY LEVEL 2;  Surgeon: Crecencio Mc, MD;  Location: [facility];  Service: Urology;  Laterality: N/A;    Family History: Family History  Problem Relation Age of Onset   Colon cancer Father    Esophageal cancer Neg Hx    Rectal cancer Neg Hx    Stomach cancer Neg Hx     Social History:   reports that he has never smoked. He has never used smokeless tobacco. He reports current alcohol use of about 5.0 standard drinks of alcohol per week. He reports that he does not use drugs.  Medications: No medications prior to admission.    No results found for this or any previous visit (from the past 48 hour(s)).  No results found.    Height 6\' 5"  (1.956 m), weight 99.8 kg.  PE:  well nourished and well developed.  NAD.  EOMI.  Resp unlabored.  L dorsal foot mass is prominent and palpable.  Assessment/Plan L dorsal foot mass  Patient presents for elective excisional biopsy of left  dorsal foot mass.  After reviewing the procedure, postoperative protocol, and risk of surgery the patient elects for surgical intervention.  The patient specifically understands risks of bleeding, infection, nerve damage, blood clots, need for additional surgery, continued pain, nonunion, post traumatic arthritis, recurrence of deformity, amputation and death.   Alfredo Martinez PA-C [facility] Office:  [phone]   No changes to the note above.  To the OR today for L foot excisional biopsy.  The risks and benefits of the alternative treatment options have been discussed in detail.  The patient wishes to proceed with surgery and specifically understands risks of bleeding, infection, nerve damage, blood clots, need for additional surgery, amputation and death.

## 2022-10-01 ENCOUNTER — Other Ambulatory Visit: Payer: Self-pay

## 2022-10-01 ENCOUNTER — Ambulatory Visit (HOSPITAL_BASED_OUTPATIENT_CLINIC_OR_DEPARTMENT_OTHER): Payer: Medicare Other | Admitting: Anesthesiology

## 2022-10-01 ENCOUNTER — Encounter (HOSPITAL_BASED_OUTPATIENT_CLINIC_OR_DEPARTMENT_OTHER): Payer: Self-pay | Admitting: Orthopedic Surgery

## 2022-10-01 ENCOUNTER — Ambulatory Visit (HOSPITAL_BASED_OUTPATIENT_CLINIC_OR_DEPARTMENT_OTHER)
Admission: RE | Admit: 2022-10-01 | Discharge: 2022-10-01 | Disposition: A | Discharge status: Home / Self Care | Payer: Medicare Other | Attending: Orthopedic Surgery | Admitting: Orthopedic Surgery

## 2022-10-01 ENCOUNTER — Encounter (HOSPITAL_BASED_OUTPATIENT_CLINIC_OR_DEPARTMENT_OTHER)
Admission: RE | Disposition: A | Discharge status: Home / Self Care | Payer: Self-pay | Source: Home / Self Care | Attending: Orthopedic Surgery

## 2022-10-01 DIAGNOSIS — I2510 Atherosclerotic heart disease of native coronary artery without angina pectoris: Secondary | ICD-10-CM

## 2022-10-01 DIAGNOSIS — N183 Chronic kidney disease, stage 3 unspecified: Secondary | ICD-10-CM | POA: Diagnosis not present

## 2022-10-01 DIAGNOSIS — D2122 Benign neoplasm of connective and other soft tissue of left lower limb, including hip: Secondary | ICD-10-CM | POA: Diagnosis not present

## 2022-10-01 DIAGNOSIS — Z01818 Encounter for other preprocedural examination: Secondary | ICD-10-CM

## 2022-10-01 DIAGNOSIS — R2242 Localized swelling, mass and lump, left lower limb: Secondary | ICD-10-CM | POA: Diagnosis not present

## 2022-10-01 DIAGNOSIS — D367 Benign neoplasm of other specified sites: Secondary | ICD-10-CM | POA: Diagnosis not present

## 2022-10-01 DIAGNOSIS — I4821 Permanent atrial fibrillation: Secondary | ICD-10-CM | POA: Insufficient documentation

## 2022-10-01 DIAGNOSIS — I129 Hypertensive chronic kidney disease with stage 1 through stage 4 chronic kidney disease, or unspecified chronic kidney disease: Secondary | ICD-10-CM | POA: Diagnosis not present

## 2022-10-01 DIAGNOSIS — G8918 Other acute postprocedural pain: Secondary | ICD-10-CM | POA: Diagnosis not present

## 2022-10-01 DIAGNOSIS — Z7901 Long term (current) use of anticoagulants: Secondary | ICD-10-CM | POA: Insufficient documentation

## 2022-10-01 HISTORY — PX: EXCISION MASS LOWER EXTREMETIES: SHX6705

## 2022-10-01 SURGERY — EXCISION MASS LOWER EXTREMITIES
Anesthesia: General | Site: Foot | Laterality: Left

## 2022-10-01 MED ORDER — LACTATED RINGERS IV SOLN: INTRAVENOUS | Status: DC

## 2022-10-01 MED ORDER — CEFAZOLIN SODIUM-DEXTROSE 2-4 GM/100ML-% IV SOLN
INTRAVENOUS | Status: AC
  Filled 2022-10-01: qty 100

## 2022-10-01 MED ORDER — AMISULPRIDE (ANTIEMETIC) 5 MG/2ML IV SOLN: 10.0000 mg | Freq: Once | INTRAVENOUS | Status: DC | PRN

## 2022-10-01 MED ORDER — ROPIVACAINE HCL 5 MG/ML IJ SOLN
INTRAMUSCULAR | Status: DC | PRN
  Administered 2022-10-01: 30 mL via PERINEURAL

## 2022-10-01 MED ORDER — DOCUSATE SODIUM 100 MG PO CAPS: 100.0000 mg | ORAL_CAPSULE | Freq: Two times a day (BID) | ORAL | 0 refills | Status: DC

## 2022-10-01 MED ORDER — VANCOMYCIN HCL 500 MG IV SOLR
INTRAVENOUS | Status: AC
  Filled 2022-10-01: qty 10

## 2022-10-01 MED ORDER — OXYCODONE HCL 5 MG/5ML PO SOLN: 5.0000 mg | Freq: Once | ORAL | Status: DC | PRN

## 2022-10-01 MED ORDER — DEXAMETHASONE SODIUM PHOSPHATE 10 MG/ML IJ SOLN
INTRAMUSCULAR | Status: DC | PRN
  Administered 2022-10-01: 4 mg via INTRAVENOUS

## 2022-10-01 MED ORDER — PROMETHAZINE HCL 25 MG/ML IJ SOLN: 6.2500 mg | INTRAMUSCULAR | Status: DC | PRN

## 2022-10-01 MED ORDER — OXYCODONE HCL 5 MG PO TABS: 5.0000 mg | ORAL_TABLET | Freq: Once | ORAL | Status: DC | PRN

## 2022-10-01 MED ORDER — VANCOMYCIN HCL 500 MG IV SOLR
INTRAVENOUS | Status: DC | PRN
  Administered 2022-10-01: 500 mg via TOPICAL

## 2022-10-01 MED ORDER — PROPOFOL 10 MG/ML IV BOLUS
INTRAVENOUS | Status: DC | PRN
Start: 2022-10-01 — End: 2022-10-01
  Administered 2022-10-01: 200 mg via INTRAVENOUS

## 2022-10-01 MED ORDER — FENTANYL CITRATE (PF) 100 MCG/2ML IJ SOLN
INTRAMUSCULAR | Status: AC
  Filled 2022-10-01: qty 2

## 2022-10-01 MED ORDER — LIDOCAINE 2% (20 MG/ML) 5 ML SYRINGE
INTRAMUSCULAR | Status: AC
  Filled 2022-10-01: qty 5

## 2022-10-01 MED ORDER — LIDOCAINE HCL (CARDIAC) PF 100 MG/5ML IV SOSY
PREFILLED_SYRINGE | INTRAVENOUS | Status: DC | PRN
  Administered 2022-10-01: 60 mg via INTRAVENOUS

## 2022-10-01 MED ORDER — 0.9 % SODIUM CHLORIDE (POUR BTL) OPTIME
TOPICAL | Status: DC | PRN
  Administered 2022-10-01: 200 mL

## 2022-10-01 MED ORDER — MIDAZOLAM HCL 2 MG/2ML IJ SOLN
INTRAMUSCULAR | Status: AC
  Filled 2022-10-01: qty 2

## 2022-10-01 MED ORDER — PROPOFOL 500 MG/50ML IV EMUL
INTRAVENOUS | Status: AC
  Filled 2022-10-01: qty 50

## 2022-10-01 MED ORDER — DEXAMETHASONE SODIUM PHOSPHATE 10 MG/ML IJ SOLN
INTRAMUSCULAR | Status: AC
  Filled 2022-10-01: qty 1

## 2022-10-01 MED ORDER — HYDROMORPHONE HCL 1 MG/ML IJ SOLN: 0.2500 mg | INTRAMUSCULAR | Status: DC | PRN

## 2022-10-01 MED ORDER — ONDANSETRON HCL 4 MG/2ML IJ SOLN
INTRAMUSCULAR | Status: AC
  Filled 2022-10-01: qty 2

## 2022-10-01 MED ORDER — ONDANSETRON HCL 4 MG/2ML IJ SOLN
INTRAMUSCULAR | Status: DC | PRN
  Administered 2022-10-01: 4 mg via INTRAVENOUS

## 2022-10-01 MED ORDER — BUPIVACAINE-EPINEPHRINE (PF) 0.5% -1:200000 IJ SOLN
INTRAMUSCULAR | Status: AC
  Filled 2022-10-01: qty 30

## 2022-10-01 MED ORDER — MIDAZOLAM HCL 2 MG/2ML IJ SOLN: 2.0000 mg | Freq: Once | INTRAMUSCULAR | Status: DC

## 2022-10-01 MED ORDER — CEFAZOLIN SODIUM-DEXTROSE 2-4 GM/100ML-% IV SOLN
2.0000 g | INTRAVENOUS | Status: AC
  Administered 2022-10-01: 2 g via INTRAVENOUS

## 2022-10-01 MED ORDER — SENNA 8.6 MG PO TABS: 2.0000 | ORAL_TABLET | Freq: Two times a day (BID) | ORAL | 0 refills | Status: DC

## 2022-10-01 MED ORDER — PHENYLEPHRINE HCL (PRESSORS) 10 MG/ML IV SOLN
INTRAVENOUS | Status: DC | PRN
  Administered 2022-10-01 (×4): 80 ug via INTRAVENOUS

## 2022-10-01 MED ORDER — SODIUM CHLORIDE 0.9 % IV SOLN: INTRAVENOUS | Status: DC

## 2022-10-01 MED ORDER — FENTANYL CITRATE (PF) 100 MCG/2ML IJ SOLN
50.0000 ug | Freq: Once | INTRAMUSCULAR | Status: AC
  Administered 2022-10-01: 50 ug via INTRAVENOUS

## 2022-10-01 MED ORDER — HYDROCODONE-ACETAMINOPHEN 5-325 MG PO TABS
1.0000 | ORAL_TABLET | Freq: Four times a day (QID) | ORAL | 0 refills | Status: AC | PRN
Start: 2022-10-01 — End: 2022-10-04

## 2022-10-01 SURGICAL SUPPLY — 69 items
APL PRP STRL LF DISP 70% ISPRP (MISCELLANEOUS) ×1
BANDAGE ESMARK 6X9 LF (GAUZE/BANDAGES/DRESSINGS) IMPLANT
BLADE MINI RND TIP GREEN BEAV (BLADE) IMPLANT
BLADE SURG 15 STRL LF DISP TIS (BLADE) ×2 IMPLANT
BLADE SURG 15 STRL SS (BLADE) ×2
BNDG CMPR 5X4 KNIT ELC UNQ LF (GAUZE/BANDAGES/DRESSINGS) ×1
BNDG CMPR 6 X 5 YARDS HK CLSR (GAUZE/BANDAGES/DRESSINGS)
BNDG CMPR 9X4 STRL LF SNTH (GAUZE/BANDAGES/DRESSINGS)
BNDG CMPR 9X6 STRL LF SNTH (GAUZE/BANDAGES/DRESSINGS)
BNDG ELASTIC 4INX 5YD STR LF (GAUZE/BANDAGES/DRESSINGS) IMPLANT
BNDG ELASTIC 6INX 5YD STR LF (GAUZE/BANDAGES/DRESSINGS) IMPLANT
BNDG ESMARK 4X9 LF (GAUZE/BANDAGES/DRESSINGS) IMPLANT
BNDG ESMARK 6X9 LF (GAUZE/BANDAGES/DRESSINGS)
BNDG GZE 12X3 1 PLY HI ABS (GAUZE/BANDAGES/DRESSINGS)
BNDG STRETCH GAUZE 3IN X12FT (GAUZE/BANDAGES/DRESSINGS) IMPLANT
CHLORAPREP W/TINT 26 (MISCELLANEOUS) ×2 IMPLANT
CORD BIPOLAR FORCEPS 12FT (ELECTRODE) IMPLANT
COVER BACK TABLE 60X90IN (DRAPES) ×2 IMPLANT
CUFF TOURN SGL QUICK 24 (TOURNIQUET CUFF) ×1
CUFF TOURN SGL QUICK 34 (TOURNIQUET CUFF)
CUFF TRNQT CYL 24X4X16.5-23 (TOURNIQUET CUFF) IMPLANT
CUFF TRNQT CYL 34X4.125X (TOURNIQUET CUFF) IMPLANT
DRAIN JACKSON RD 7FR 3/32 (WOUND CARE) IMPLANT
DRAPE EXTREMITY T 121X128X90 (DISPOSABLE) ×2 IMPLANT
DRAPE OEC MINIVIEW 54X84 (DRAPES) IMPLANT
DRAPE SURG 17X23 STRL (DRAPES) IMPLANT
DRAPE U-SHAPE 47X51 STRL (DRAPES) IMPLANT
DRSG MEPITEL 4X7.2 (GAUZE/BANDAGES/DRESSINGS) ×2 IMPLANT
ELECT REM PT RETURN 9FT ADLT (ELECTROSURGICAL) ×1
ELECTRODE REM PT RTRN 9FT ADLT (ELECTROSURGICAL) ×2 IMPLANT
EVACUATOR SILICONE 100CC (DRAIN) IMPLANT
GAUZE PAD ABD 8X10 STRL (GAUZE/BANDAGES/DRESSINGS) ×2 IMPLANT
GAUZE SPONGE 4X4 12PLY STRL (GAUZE/BANDAGES/DRESSINGS) ×2 IMPLANT
GLOVE BIO SURGEON STRL SZ8 (GLOVE) ×2 IMPLANT
GLOVE BIOGEL PI IND STRL 8 (GLOVE) ×4 IMPLANT
GLOVE ECLIPSE 8.0 STRL XLNG CF (GLOVE) ×2 IMPLANT
GOWN STRL REUS W/ TWL LRG LVL3 (GOWN DISPOSABLE) ×2 IMPLANT
GOWN STRL REUS W/ TWL XL LVL3 (GOWN DISPOSABLE) ×4 IMPLANT
GOWN STRL REUS W/TWL LRG LVL3 (GOWN DISPOSABLE) ×2
GOWN STRL REUS W/TWL XL LVL3 (GOWN DISPOSABLE) ×2
NDL HYPO 22X1.5 SAFETY MO (MISCELLANEOUS) IMPLANT
NDL HYPO 25X1 1.5 SAFETY (NEEDLE) IMPLANT
NEEDLE HYPO 22X1.5 SAFETY MO (MISCELLANEOUS)
NEEDLE HYPO 25X1 1.5 SAFETY (NEEDLE)
NS IRRIG 1000ML POUR BTL (IV SOLUTION) ×2 IMPLANT
PACK BASIN DAY SURGERY FS (CUSTOM PROCEDURE TRAY) ×2 IMPLANT
PAD CAST 4YDX4 CTTN HI CHSV (CAST SUPPLIES) ×2 IMPLANT
PADDING CAST ABS COTTON 4X4 ST (CAST SUPPLIES) IMPLANT
PADDING CAST COTTON 4X4 STRL (CAST SUPPLIES) ×1
PADDING CAST COTTON 6X4 STRL (CAST SUPPLIES) IMPLANT
PENCIL SMOKE EVACUATOR (MISCELLANEOUS) ×2 IMPLANT
SANITIZER HAND PURELL FF 515ML (MISCELLANEOUS) ×2 IMPLANT
SHEET MEDIUM DRAPE 40X70 STRL (DRAPES) ×2 IMPLANT
SLEEVE SCD COMPRESS KNEE MED (STOCKING) ×2 IMPLANT
SPONGE T-LAP 18X18 ~~LOC~~+RFID (SPONGE) ×2 IMPLANT
STOCKINETTE 6 STRL (DRAPES) ×2 IMPLANT
STRIP CLOSURE SKIN 1/2X4 (GAUZE/BANDAGES/DRESSINGS) IMPLANT
SUCTION TUBE FRAZIER 10FR DISP (SUCTIONS) IMPLANT
SUT ETHILON 3 0 PS 1 (SUTURE) ×2 IMPLANT
SUT MNCRL AB 3-0 PS2 18 (SUTURE) IMPLANT
SUT VIC AB 2-0 SH 27 (SUTURE)
SUT VIC AB 2-0 SH 27XBRD (SUTURE) IMPLANT
SUT VICRYL 0 SH 27 (SUTURE) IMPLANT
SYR BULB EAR ULCER 3OZ GRN STR (SYRINGE) ×2 IMPLANT
SYR CONTROL 10ML LL (SYRINGE) IMPLANT
TOWEL GREEN STERILE FF (TOWEL DISPOSABLE) ×2 IMPLANT
TUBE CONNECTING 20X1/4 (TUBING) IMPLANT
UNDERPAD 30X36 HEAVY ABSORB (UNDERPADS AND DIAPERS) ×2 IMPLANT
YANKAUER SUCT BULB TIP NO VENT (SUCTIONS) IMPLANT

## 2022-10-01 NOTE — Anesthesia Procedure Notes (Signed)
Anesthesia Regional Block: Popliteal block   Pre-Anesthetic Checklist: , timeout performed,  Correct Patient, Correct Site, Correct Laterality,  Correct Procedure, Correct Position, site marked,  Risks and benefits discussed,  Surgical consent,  Pre-op evaluation,  At surgeon's request and post-op pain management  Laterality: Left  Prep: chloraprep       Needles:  Injection technique: Single-shot  Needle Type: Stimiplex     Needle Length: 9cm  Needle Gauge: 21     Additional Needles:   Procedures:,,,, ultrasound used (permanent image in chart),,    Narrative:  Start time: 10/01/2022 8:52 AM End time: 10/01/2022 6:57 AM Injection made incrementally with aspirations every 5 mL.  Performed by: Personally  Anesthesiologist: Lowella Curb, MD

## 2022-10-01 NOTE — Anesthesia Procedure Notes (Signed)
Procedure Name: LMA Insertion Date/Time: 10/01/2022 7:42 AM  Performed by: Burna Cash, CRNAPre-anesthesia Checklist: Patient identified, Emergency Drugs available, Suction available and Patient being monitored Patient Re-evaluated:Patient Re-evaluated prior to induction Oxygen Delivery Method: Circle system utilized Preoxygenation: Pre-oxygenation with 100% oxygen Induction Type: IV induction Ventilation: Mask ventilation without difficulty LMA: LMA inserted LMA Size: 5.0 Number of attempts: 1 Airway Equipment and Method: Bite block Placement Confirmation: positive ETCO2 Tube secured with: Tape Dental Injury: Teeth and Oropharynx as per pre-operative assessment

## 2022-10-01 NOTE — Anesthesia Postprocedure Evaluation (Signed)
Anesthesia Post Note  Patient: Kenneth Russell  Procedure(s) Performed: Erasmo Leventhal biospy of left dorsal foot mass (Left: Foot)     Patient location during evaluation: PACU Anesthesia Type: General Level of consciousness: awake and alert Pain management: pain level controlled Vital Signs Assessment: post-procedure vital signs reviewed and stable Respiratory status: spontaneous breathing, nonlabored ventilation and respiratory function stable Cardiovascular status: blood pressure returned to baseline and stable Postop Assessment: no apparent nausea or vomiting Anesthetic complications: no   No notable events documented.  Last Vitals:  Vitals:   10/01/22 0900 10/01/22 0916  BP: 112/83 125/83  Pulse: 69 67  Resp: 12 14  Temp:  (!) 36.3 C  SpO2: 99% 97%    Last Pain:  Vitals:   10/01/22 0916  TempSrc:   PainSc: 0-No pain                 Lowella Curb

## 2022-10-01 NOTE — Op Note (Signed)
10/01/2022  8:43 AM  PATIENT:  Kenneth Russell  77 y.o. male  PRE-OPERATIVE DIAGNOSIS: Left dorsal foot mass  POST-OPERATIVE DIAGNOSIS: Same (3-1/2 cm x 2-1/2 cm x 1 cm)  Procedure(s): Excision of left dorsal foot subfascial mass (3-1/2 cm x 2-1/2 cm x 1 cm)(28041)  SURGEON:  Toni Arthurs, MD  ASSISTANT: Alfredo Martinez, PA-C  ANESTHESIA:   General, regional  EBL:  minimal   TOURNIQUET:   Total Tourniquet Time Documented: Calf (Left) - 13 minutes Total: Calf (Left) - 13 minutes  COMPLICATIONS:  None apparent  DISPOSITION:  Extubated, awake and stable to recovery.  INDICATION FOR PROCEDURE: 77 year old male with past medical history significant for atrial fibrillation on Eliquis complains of an enlarging left dorsal foot mass.  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  Fibrosarcoma is also in the differential.  He presents today for excision of this enlarging mass.  The risks and benefits of the alternative treatment options have been discussed in detail.  The patient wishes to proceed with surgery and specifically understands risks of bleeding, infection, nerve damage, blood clots, need for additional surgery, amputation and death.   PROCEDURE IN DETAIL:  After pre operative consent was obtained, and the correct operative site was identified, the patient was brought to the operating room and placed supine on the OR table.  Anesthesia was administered.  Pre-operative antibiotics were administered.  A surgical timeout was taken.  The left lower extremity was prepped and draped in standard sterile fashion with a tourniquet around the thigh.  The extremity was elevated, and the tourniquet was inflated to 250 mmHg.  A longitudinal incision was made over the mass.  Dissection was carried sharply down through the skin.  The mass was identified.  Careful dissection was carried around the mass down to the level of the fascia.  The fascia was incised.  The extensor tendons were  mobilized and retracted to the hallux and second toe.  The mass was elevated from the underlying periosteum.  There was no evidence of local invasion.  The neurovascular bundle was protected.  1 small branch of the dorsalis pedis artery went into the mass and had to be cauterized.  The mass was removed in its entirety and passed off the field as a specimen to pathology.  It measured 3-1/2 cm x 2-1/2 cm x 1 cm.  The wound was then irrigated copiously.  The tourniquet was released, and hemostasis was achieved.  A 7 French drain was placed in the base of the wound.  Vancomycin powder was pickled in the wound.  The incision was closed with horizontal mattress sutures of 3-0 nylon.  Sterile dressings were applied followed by compression wrap.  The patient was awakened from anesthesia and transported to the recovery room in stable condition.  FOLLOW UP PLAN: Weightbearing as tolerated in a flat postop shoe.  Follow-up in the office in 2 weeks for suture removal.  He will resume Eliquis on postop day 2.  His wife is instructed to remove the drain on postop day 1.  DVT prophylaxis with Eliquis.     Alfredo Martinez PA-C was present and scrubbed for the duration of the operative case. His assistance was essential in positioning the patient, prepping and draping, gaining and maintaining exposure, performing the operation, closing and dressing the wounds and applying the splint.

## 2022-10-01 NOTE — Discharge Instructions (Addendum)
Post Anesthesia Home Care Instructions  Activity: Get plenty of rest for the remainder of the day. A responsible individual must stay with you for 24 hours following the procedure.  For the next 24 hours, DO NOT: -Drive a car -Advertising copywriter -Drink alcoholic beverages -Take any medication unless instructed by your physician -Make any legal decisions or sign important papers.  Meals: Start with liquid foods such as gelatin or soup. Progress to regular foods as tolerated. Avoid greasy, spicy, heavy foods. If nausea and/or vomiting occur, drink only clear liquids until the nausea and/or vomiting subsides. Call your physician if vomiting continues.  Special Instructions/Symptoms: Your throat may feel dry or sore from the anesthesia or the breathing tube placed in your throat during surgery. If this causes discomfort, gargle with warm salt water. The discomfort should disappear within 24 hours.  If you had a scopolamine patch placed behind your ear for the management of post- operative nausea and/or vomiting:  1. The medication in the patch is effective for 72 hours, after which it should be removed.  Wrap patch in a tissue and discard in the trash. Wash hands thoroughly with soap and water. 2. You may remove the patch earlier than 72 hours if you experience unpleasant side effects which may include dry mouth, dizziness or visual disturbances. 3. Avoid touching the patch. Wash your hands with soap and water after contact with the patch.      Regional Anesthesia Blocks  1. Numbness or the inability to move the "blocked" extremity may last from 3-48 hours after placement. The length of time depends on the medication injected and your individual response to the medication. If the numbness is not going away after 48 hours, call your surgeon.  2. The extremity that is blocked will need to be protected until the numbness is gone and the  Strength has returned. Because you cannot feel it, you  will need to take extra care to avoid injury. Because it may be weak, you may have difficulty moving it or using it. You may not know what position it is in without looking at it while the block is in effect.  3. For blocks in the legs and feet, returning to weight bearing and walking needs to be done carefully. You will need to wait until the numbness is entirely gone and the strength has returned. You should be able to move your leg and foot normally before you try and bear weight or walk. You will need someone to be with you when you first try to ensure you do not fall and possibly risk injury.  4. Bruising and tenderness at the needle site are common side effects and will resolve in a few days.  5. Persistent numbness or new problems with movement should be communicated to the surgeon or the [facility] [phone] [facility] [phone]).    Toni Arthurs, MD [facility]  Please read the following information regarding your care after surgery.  Medications  You only need a prescription for the narcotic pain medicine (ex. oxycodone, Percocet, Norco).  All of the other medicines listed below are available over the counter. ? hydrocodone as prescribed for severe pain  Narcotic pain medicine (ex. oxycodone, Percocet, Vicodin) will cause constipation.  To prevent this problem, take the following medicines while you are taking any pain medicine. ? docusate sodium (Colace) 100 mg twice a day ? senna (Senokot) 2 tablets twice a day  ? To help prevent blood clots, resume  Eliquis two days after surgery.  You should also get up every hour while you are awake to move around.    Weight Bearing ? Bear weight only on your operated foot in the post-op shoe.   Cast / Splint / Dressing ? Keep your splint, cast or dressing clean and dry.  Don't put anything (coat hanger, pencil, etc) down inside of it.  If it gets damp, use a hair dryer on the cool setting to dry it.  If  it gets soaked, call the office to schedule an appointment for a cast change. ? Pull the drain tomorrow.   After your dressing, cast or splint is removed; you may shower, but do not soak or scrub the wound.  Allow the water to run over it, and then gently pat it dry.  Swelling It is normal for you to have swelling where you had surgery.  To reduce swelling and pain, keep your toes above your nose for at least 3 days after surgery.  It may be necessary to keep your foot or leg elevated for several weeks.  If it hurts, it should be elevated.  Follow Up Call my office at [phone] when you are discharged from the hospital or surgery center to schedule an appointment to be seen two weeks after surgery.  Call my office at [phone] if you develop a fever >101.5 F, nausea, vomiting, bleeding from the surgical site or severe pain.    About my Jackson-Pratt Bulb Drain  What is a Jackson-Pratt bulb? A Jackson-Pratt is a soft, round device used to collect drainage. It is connected to a long, thin drainage catheter, which is held in place by one or two small stiches near your surgical incision site. When the bulb is squeezed, it forms a vacuum, forcing the drainage to empty into the bulb.  Emptying the Jackson-Pratt bulb- To empty the bulb: 1. Release the plug on the top of the bulb. 2. Pour the bulb's contents into a measuring container which your nurse will provide. 3. Record the time emptied and amount of drainage. Empty the drain(s) as often as your     doctor or nurse recommends.  Date                  Time                    Amount (Drain 1)                 Amount (Drain  2)  _____________________________________________________________________  _____________________________________________________________________  _____________________________________________________________________  _____________________________________________________________________  _____________________________________________________________________  _____________________________________________________________________  _____________________________________________________________________  _____________________________________________________________________  Squeezing the Jackson-Pratt Bulb- To squeeze the bulb: 1. Make sure the plug at the top of the bulb is open. 2. Squeeze the bulb tightly in your fist. You will hear air squeezing from the bulb. 3. Replace the plug while the bulb is squeezed. 4. Use a safety pin to attach the bulb to your clothing. This will keep the catheter from     pulling at the bulb insertion site.  When to call your doctor- Call your doctor if: Drain site becomes red, swollen or hot. You have a fever greater than 101 degrees F. There is oozing at the drain site. Drain falls out (apply a guaze bandage over the drain hole and secure it with tape). Drainage increases daily not related to activity patterns. (You will usually have more drainage when you are active than when you are resting.) Drainage has a bad  odor.

## 2022-10-01 NOTE — Progress Notes (Signed)
Assisted Dr. Miller with left, popliteal, ultrasound guided block. Side rails up, monitors on throughout procedure. See vital signs in flow sheet. Tolerated Procedure well. 

## 2022-10-01 NOTE — Anesthesia Preprocedure Evaluation (Signed)
Anesthesia Evaluation  Patient identified by MRN, date of birth, ID band Patient awake    Reviewed: Allergy & Precautions, H&P , NPO status , Patient's Chart, lab work & pertinent test results, reviewed documented beta blocker date and time   Airway Mallampati: II  TM Distance: >3 FB Neck ROM: Full    Dental  (+) Dental Advisory Given, Teeth Intact   Pulmonary neg pulmonary ROS   breath sounds clear to auscultation       Cardiovascular hypertension, Pt. on medications and Pt. on home beta blockers + CAD  + dysrhythmias Atrial Fibrillation  Rhythm:Regular Rate:Normal     Neuro/Psych   Anxiety     negative neurological ROS     GI/Hepatic negative GI ROS, Neg liver ROS,,,  Endo/Other  negative endocrine ROS    Renal/GU Renal InsufficiencyRenal disease     Musculoskeletal negative musculoskeletal ROS (+)    Abdominal   Peds  Hematology negative hematology ROS (+)   Anesthesia Other Findings   Reproductive/Obstetrics                             Anesthesia Physical Anesthesia Plan  ASA: 3  Anesthesia Plan: General   Post-op Pain Management: Regional block* and Minimal or no pain anticipated   Induction: Intravenous  PONV Risk Score and Plan: 2 and Ondansetron, Midazolam and Treatment may vary due to age or medical condition  Airway Management Planned: LMA  Additional Equipment:   Intra-op Plan:   Post-operative Plan: Extubation in OR  Informed Consent: I have reviewed the patients History and Physical, chart, labs and discussed the procedure including the risks, benefits and alternatives for the proposed anesthesia with the patient or authorized representative who has indicated his/her understanding and acceptance.     Dental advisory given  Plan Discussed with: CRNA  Anesthesia Plan Comments:         Anesthesia Quick Evaluation

## 2022-10-01 NOTE — Transfer of Care (Signed)
Immediate Anesthesia Transfer of Care Note  Patient: Kenneth Russell  Procedure(s) Performed: [facility]asm[facility]osp[facility] of left dorsal foot mass (Left: Foot)  Patient Location: PACU  Anesthesia Type:General  Level of Consciousness: sedated  Airway & Oxygen Therapy: Patient Spontanous Breathing and Patient connected to face mask oxygen  Post-op Assessment: Report given to RN and Post -op Vital signs reviewed and stable  Post vital signs: Reviewed and stable  Last Vitals:  Vitals Value Taken Time  BP 101/73 10/01/22 0832  Temp    Pulse 61 10/01/22 0833  Resp 11 10/01/22 0833  SpO2 97 % 10/01/22 0833  Vitals shown include unfiled device data.  Last Pain:  Vitals:   10/01/22 0625  TempSrc: Temporal  PainSc: 0-No pain      Patients Stated Pain Goal: 3 (10/01/22 4010)  Complications: No notable events documented.

## 2022-10-02 ENCOUNTER — Encounter (HOSPITAL_BASED_OUTPATIENT_CLINIC_OR_DEPARTMENT_OTHER): Payer: Self-pay | Admitting: Orthopedic Surgery

## 2022-11-12 ENCOUNTER — Other Ambulatory Visit: Payer: Self-pay | Admitting: Cardiology

## 2022-11-16 ENCOUNTER — Ambulatory Visit: Payer: Medicare Other | Admitting: Nurse Practitioner

## 2022-11-20 DIAGNOSIS — Z23 Encounter for immunization: Secondary | ICD-10-CM | POA: Diagnosis not present

## 2022-11-24 ENCOUNTER — Encounter: Payer: Self-pay | Admitting: Nurse Practitioner

## 2022-11-24 ENCOUNTER — Ambulatory Visit (INDEPENDENT_AMBULATORY_CARE_PROVIDER_SITE_OTHER): Payer: PRIVATE HEALTH INSURANCE | Admitting: Nurse Practitioner

## 2022-11-24 VITALS — BP 110/70 | HR 58 | Temp 98.0°F | Ht 77.0 in | Wt 236.2 lb

## 2022-11-24 DIAGNOSIS — F419 Anxiety disorder, unspecified: Secondary | ICD-10-CM

## 2022-11-24 DIAGNOSIS — E559 Vitamin D deficiency, unspecified: Secondary | ICD-10-CM

## 2022-11-24 DIAGNOSIS — I10 Essential (primary) hypertension: Secondary | ICD-10-CM | POA: Diagnosis not present

## 2022-11-24 DIAGNOSIS — K120 Recurrent oral aphthae: Secondary | ICD-10-CM

## 2022-11-24 DIAGNOSIS — R6889 Other general symptoms and signs: Secondary | ICD-10-CM | POA: Diagnosis not present

## 2022-11-24 DIAGNOSIS — Z8546 Personal history of malignant neoplasm of prostate: Secondary | ICD-10-CM | POA: Diagnosis not present

## 2022-11-24 DIAGNOSIS — B0089 Other herpesviral infection: Secondary | ICD-10-CM

## 2022-11-24 DIAGNOSIS — R7309 Other abnormal glucose: Secondary | ICD-10-CM | POA: Diagnosis not present

## 2022-11-24 DIAGNOSIS — E782 Mixed hyperlipidemia: Secondary | ICD-10-CM | POA: Diagnosis not present

## 2022-11-24 DIAGNOSIS — I480 Paroxysmal atrial fibrillation: Secondary | ICD-10-CM

## 2022-11-24 DIAGNOSIS — Z79899 Other long term (current) drug therapy: Secondary | ICD-10-CM

## 2022-11-24 DIAGNOSIS — E663 Overweight: Secondary | ICD-10-CM

## 2022-11-24 DIAGNOSIS — Z0001 Encounter for general adult medical examination with abnormal findings: Secondary | ICD-10-CM

## 2022-11-24 DIAGNOSIS — K5901 Slow transit constipation: Secondary | ICD-10-CM

## 2022-11-24 DIAGNOSIS — N1831 Chronic kidney disease, stage 3a: Secondary | ICD-10-CM | POA: Diagnosis not present

## 2022-11-24 DIAGNOSIS — Z8619 Personal history of other infectious and parasitic diseases: Secondary | ICD-10-CM

## 2022-11-24 DIAGNOSIS — Z0000 Encounter for general adult medical examination without abnormal findings: Secondary | ICD-10-CM

## 2022-11-24 DIAGNOSIS — H40003 Preglaucoma, unspecified, bilateral: Secondary | ICD-10-CM

## 2022-11-24 DIAGNOSIS — Z1211 Encounter for screening for malignant neoplasm of colon: Secondary | ICD-10-CM

## 2022-11-24 NOTE — Progress Notes (Signed)
MEDICARE ANNUAL WELLNESS VISIT AND FOLLOW UP Assessment:   Diagnoses and all orders for this visit:  Annual Medicare Wellness Visit Due annually  Health maintenance reviewed  Paroxysmal atrial fibrillation (HCC) Controlled on atenolol,  Has stopped flecainide On elequis Followed by cardiology  Essential hypertension Discussed DASH (Dietary Approaches to Stop Hypertension) DASH diet is lower in sodium than a typical American diet. Cut back on foods that are high in saturated fat, cholesterol, and trans fats. Eat more whole-grain foods, fish, poultry, and nuts Remain active and exercise as tolerated daily.  Monitor BP at home-Call if greater than 130/80.  Check CMP/CBC  Hx of prostate cancer [facility]) S/p prostatectomy in 2014; followed by Dr. Cathie Hoops Yearly  Vitamin D deficiency At goal at recent check; continue to recommend supplementation for goal of 60-100  Medication management All medications discussed and reviewed in full. All questions and concerns regarding medications addressed.     Mixed hyperlipidemia Discussed lifestyle modifications. Recommended diet heavy in fruits and veggies, omega 3's. Decrease consumption of animal meats, cheeses, and dairy products. Remain active and exercise as tolerated. Continue to monitor. Check lipids/TSH   Glaucoma suspect, bilateral Controlled Followed by Dr. Lottie Dawson  BMI 27 Continue to recommend diet heavy in fruits and veggies and low in animal meats, cheeses, and dairy products, appropriate calorie intake Discuss exercise recommendations routinely Continue to monitor weight at each visit  Anxiety He uses xanax sporadically PRN; discssed risks and agreeable to d/c He will try benadryl; consider trazodone if needed Stress management techniques discussed, increase water, good sleep hygiene discussed, increase exercise, and increase veggies.   Abnormal glucose Education: Reviewed 'ABCs' of diabetes management   Discussed goals to be met and/or maintained include A1C (<7) Blood pressure (<130/80) Cholesterol (LDL <70) Continue Dental Exam Q6 mo Discussed dietary recommendations Discussed Physical Activity recommendations Check A1C  CKD (chronic kidney disease) stage 3, GFR 30-59 ml/min (HCC) Discussed how what you eat and drink can aide in kidney protection. Stay well hydrated. Avoid high salt foods. Avoid NSAIDS. Keep BP and BG well controlled.   Take medications as prescribed. Remain active and exercise as tolerated daily. Maintain weight.  Continue to monitor. Check CMP/GFR/Microablumin  Cold sores/Herpes simplex virus type 1 Valtrex PRN flare  Aphthouus stomatitis Resolved   Gilberts Syndrome/ familial non haemolytic jaundice Monitor LFTs; bilirubin  Screening for colon cancer/constipation Overdue for colonoscopy 2024 - Cologuard ordered  Orders Placed This Encounter  Procedures   Cologuard   Had labs completed 09/2022 prior to surgery - patient defers at this time.    Notify office for further evaluation and treatment, questions or concerns if any reported s/s fail to improve.   The patient was advised to call back or seek an in-person evaluation if any symptoms worsen or if the condition fails to improve as anticipated.   Further disposition pending results of labs. Discussed med's effects and SE's.    I discussed the assessment and treatment plan with the patient. The patient was provided an opportunity to ask questions and all were answered. The patient agreed with the plan and demonstrated an understanding of the instructions.  Discussed med's effects and SE's. Screening labs and tests as requested with regular follow-up as recommended.  I provided 30 minutes of face-to-face time during this encounter including counseling, chart review, and critical decision making was preformed.   Future Appointments  Date Time Provider Department Center  08/06/2023 10:00 AM  Adela Glimpse, NP [facility] None  11/24/2023 11:30 AM  Adela Glimpse, NP [facility] None     Plan:   During the course of the visit the patient was educated and counseled about appropriate screening and preventive services including:   Pneumococcal vaccine  Influenza vaccine Prevnar 13 Td vaccine Screening electrocardiogram Colorectal cancer screening Diabetes screening Glaucoma screening Nutrition counseling    Subjective:  Kenneth Russell is a 77 y.o. male who presents for Medicare Annual Wellness Visit and 3 month follow up for HTN, HLDhyperlipidemia, glucose management, and vitamin D Def.   Overall he reports feeling well today.  He has no new concerns at this time.  Had excisional biopsy of left dorsal foot mass on 10/01/22 with Dr. Victorino Dike, [facility] which was shown to be a fibroma. Followed up on 10/12/22 for post surgical consult and doing well overall.    He is past due for colonoscopy.  Last completed 2014 with 10 year recall.  He has been endorsing increase in constipation despite medications, dietary and activity changes.   Patient is S/p Radical Prostatectomy in 2014 by Dr. Laverle Patter, follows annually without recurrent concerns.   He has recurrent mouth sores, takes valtrex PRN for flares.   Anxiety, xanax 1 mg, takes rarely at night for sleep, 30 tabs last almost a full year. He also takes benadryl which does work well. Discussed risks with xanax and he is receptive to discontinuing.   BMI is Body mass index is 28.01 kg/m., he has been working on diet and exercise. Very active, exercises at least 60 min daily, cycling, weights, tennis, golf.  Wt Readings from Last 3 Encounters:  11/24/22 236 lb 3.2 oz (107.1 kg)  10/01/22 231 lb 7.7 oz (105 kg)  08/06/22 231 lb 12.8 oz (105.1 kg)   He had a Negative Cardiolite in 2007. In Jan 2018, he had a negative heart cath evaluating new onset pAfib/Flutter. He had several CV however with recurrence, now on atenolol and  eliquis. Patient denies any cardiac symptoms. He golfs or exercises daily. Follows with Dr. Jens Som annually.   His blood pressure has been controlled at home, today their BP is BP: 110/70 He does workout. He denies chest pain, shortness of breath, dizziness.   He is on cholesterol medication (simvastatin 40 mg daily) and denies myalgias. His cholesterol is at goal. The cholesterol last visit was:   Lab Results  Component Value Date   CHOL 161 08/06/2022   HDL 80 08/06/2022   LDLCALC 68 08/06/2022   TRIG 52 08/06/2022   CHOLHDL 2.0 08/06/2022   He has been working on diet and exercise for glucose management, and denies increased appetite, nausea, paresthesia of the feet, polydipsia, polyuria and visual disturbances. Last A1C in the office was:  Lab Results  Component Value Date   HGBA1C 5.4 08/06/2022   Last GFR, has CKD3a monitored and last OV: Lab Results  Component Value Date   GFRNONAA 56 (L) 09/28/2022   GFRNONAA 57 (L) 07/18/2020   GFRNONAA 51 (L) 04/09/2020   Patient is on Vitamin D supplement.   Lab Results  Component Value Date   VD25OH 69 08/06/2022     He has no overt anemia but note he was hyperchromic/macrocytic on recent CBC;  Lab Results  Component Value Date   WBC 4.1 08/06/2022   HGB 14.0 08/06/2022   HCT 41.1 08/06/2022   MCV 99.5 08/06/2022   MCH 33.9 (H) 08/06/2022   RDW 12.1 08/06/2022   PLT 186 08/06/2022   Hx of borderline def, discussed and plan  to start SL supplement and recheck at follow up Lab Results  Component Value Date   VITAMINB12 1,771 (H) 08/05/2021     Medication Review:   Current Outpatient Medications (Cardiovascular):    amLODipine (NORVASC) 5 MG tablet, Take 1 tablet (5 mg total) by mouth daily.   atenolol (TENORMIN) 25 MG tablet, TAKE 1 TABLET BY MOUTH DAILY   ezetimibe (ZETIA) 10 MG tablet, Take  1 tablet  Daily  for Cholesterol                           /      TAKE 1 TABLET BY MOUTH DAILY   hydrochlorothiazide  (HYDRODIURIL) 25 MG tablet, TAKE 1 TABLET BY MOUTH DAILY   olmesartan (BENICAR) 40 MG tablet, Take  1 tablet  at Bedtime for BP   simvastatin (ZOCOR) 40 MG tablet, TAKE 1 TABLET BY MOUTH AT BEDTIME FOR CHOLESTEROL  Current Outpatient Medications (Respiratory):    diphenhydrAMINE (BENADRYL) 25 mg capsule, Take 25 mg by mouth at bedtime as needed for sleep.  Current Outpatient Medications (Analgesics):    ibuprofen (ADVIL) 200 MG tablet, Take by mouth.  Current Outpatient Medications (Hematological):    apixaban (ELIQUIS) 5 MG TABS tablet, TAKE 1 TABLET BY MOUTH TWICE A DAY  Current Outpatient Medications (Other):    Cholecalciferol (VITAMIN D3) 5000 UNITS CAPS, Take 5,000 Int'l Units by mouth daily.   Glucos-Chond-Hyal Ac-Ca Fructo (MOVE FREE JOINT HEALTH ADVANCE) TABS, Take 1 tablet by mouth every morning.   meclizine (ANTIVERT) 25 MG tablet, Take 1/2 to 1 tablet 2 to 3 x /day as needed for  "Sea Sickness", Dizziness / Vertigo   valACYclovir (VALTREX) 500 MG tablet, Take  1 tablet  Daily  to Prevent Fever Blisters                                       /                                TAKE                               BY                      MOUTH   docusate sodium (COLACE) 100 MG capsule, Take 1 capsule (100 mg total) by mouth 2 (two) times daily. While taking narcotic pain medicine.   senna (SENOKOT) 8.6 MG TABS tablet, Take 2 tablets (17.2 mg total) by mouth 2 (two) times daily.  Allergies: Allergies  Allergen Reactions   Ultram [Tramadol] Anaphylaxis   Atorvastatin     Other reaction(s): Myalgias (intolerance)   Doxazosin Other (See Comments)    Dizziness     Current Problems (verified) has Hyperlipidemia, LDL goal <50; Hypertension; Anxiety; Vitamin D deficiency; Prostate cancer (HCC); Medication management; Abnormal glucose; Overweight (BMI 25.0-29.9); Atrial flutter (HCC); Glaucoma suspect, bilateral; Paroxysmal atrial fibrillation (HCC); CKD (chronic kidney disease) stage 3,  GFR 30-59 ml/min (HCC); Tortuous aorta (HCC); Thoracic aortic aneurysm without rupture (HCC); Coronary artery disease involving native coronary artery of native heart without angina pectoris; B12 deficiency; and Gilbert's syndrome on their problem list.  Screening Tests Immunization History  Administered Date(s) Administered   Fluad  Quad(high Dose 65+) 12/05/2019   Influenza, High Dose Seasonal PF 01/31/2015, 12/03/2017   Influenza-Unspecified 12/19/2015, 12/24/2016   MMR 02/23/2005   PFIZER(Purple Top)SARS-COV-2 Vaccination 03/19/2019, 04/11/2019, 10/27/2019, 06/01/2020   Pfizer Covid-19 Vaccine Bivalent Booster 3yrs & up 11/24/2020, 06/28/2021   Pneumococcal Conjugate-13 01/31/2015   Pneumococcal Polysaccharide-23 02/23/2010, 03/30/2016   Tdap 02/23/2005    Preventative care: Last colonoscopy: 2014 due 2024  Prior vaccinations: TD or Tdap: 2007 DUE, get PRN due to cost Influenza: Due 11/2022 - Pharmacy  Pneumococcal: 2018 Prevnar13: 2016 Shingles/Zostavax: check with insurance  Covid 19: 2/2, pfizer + booster   Names of Other Physician/Practitioners you currently use: 1. [facility] here for primary care 2. Dr. Lottie Dawson, eye doctor, last visit 2024, monitoring glaucoma, goes annually, stable  3. Dr. Alvester Morin, dentist, last visit, last visit 08/2022, goes annually   Patient Care Team: Lucky Cowboy, MD as PCP - General (Internal Medicine) Jens Som Madolyn Frieze, MD as PCP - Cardiology (Cardiology) Heloise Purpura, MD as Consulting Physician (Urology) Louis Meckel, MD (Inactive) as Consulting Physician (Gastroenterology) Jene Every, MD as Consulting Physician (Orthopedic Surgery) Donzetta Starch, MD as Consulting Physician (Dermatology) Bond, Doran Stabler, MD as Referring Physician (Ophthalmology) Gayla Doss, MD as Referring Physician (Cardiology)  Surgical: He  has a past surgical history that includes Colonoscopy; Robot assisted  laparoscopic radical prostatectomy (N/A, 06/23/2012); Lymphadenectomy (Bilateral, 06/23/2012); and Excision mass lower extremeties (Left, 10/01/2022). Family His family history includes Colon cancer in his father. Social history  He reports that he has never smoked. He has never used smokeless tobacco. He reports current alcohol use of about 5.0 standard drinks of alcohol per week. He reports that he does not use drugs.  MEDICARE WELLNESS OBJECTIVES: Physical activity:   Cardiac risk factors:   Depression/mood screen:      11/13/2021    5:37 AM  Depression screen PHQ 2/9  Decreased Interest 0  Down, Depressed, Hopeless 0  PHQ - 2 Score 0    ADLs:     10/01/2022    6:15 AM  In your present state of health, do you have any difficulty performing the following activities:  Hearing? 0  Vision? 0  Difficulty concentrating or making decisions? 0  Walking or climbing stairs? 0  Dressing or bathing? 0     Cognitive Testing  Alert? Yes  Normal Appearance?Yes  Oriented to person? Yes  Place? Yes   Time? Yes  Recall of three objects?  Yes  Can perform simple calculations? Yes  Displays appropriate judgment?Yes  Can read the correct time from a watch face?Yes  EOL planning:     Objective:   Today's Vitals   11/24/22 1138  BP: 110/70  Pulse: (!) 58  Temp: 98 F (36.7 C)  SpO2: 98%  Weight: 236 lb 3.2 oz (107.1 kg)  Height: 6\' 5"  (1.956 m)     Body mass index is 28.01 kg/m.  General Appearance: Well nourished, in no apparent distress. Eyes: PERRLA, EOMs, conjunctiva no swelling or erythema Sinuses: No Frontal/maxillary tenderness ENT/Mouth: Ext aud canals clear, TMs without erythema, bulging. No erythema, swelling, or exudate on post pharynx.  Tonsils not swollen or erythematous. Hearing normal.  Neck: Supple, thyroid normal.  Respiratory: Respiratory effort normal, BS without rales, rhonchi, wheezing or stridor.  Cardio: RRR with no MRGs. Brisk peripheral pulses without  edema.  Abdomen: Soft, + BS.  Non tender, no guarding, rebound, hernias, masses. Lymphatics: Non tender without lymphadenopathy.  Musculoskeletal: Full ROM, 5/5  strength, normal gait.  Skin: Warm, dry without rashes, lesions, ecchymosis.  Neuro: Cranial nerves intact. Normal muscle tone, no cerebellar symptoms. Sensation intact.  Psych: Awake and oriented X 3, normal affect, Insight and Judgment appropriate.   Medicare Attestation I have personally reviewed: The patient's medical and social history Their use of alcohol, tobacco or illicit drugs Their current medications and supplements The patient's functional ability including ADLs,fall risks, home safety risks, cognitive, and hearing and visual impairment Diet and physical activities Evidence for depression or mood disorders  The patient's weight, height, BMI, and visual acuity have been recorded in the chart.  I have made referrals, counseling, and provided education to the patient based on review of the above and I have provided the patient with a written personalized care plan for preventive services.     Adela Glimpse, NP   11/24/2022

## 2022-11-24 NOTE — Patient Instructions (Signed)

## 2022-12-06 DIAGNOSIS — Z1211 Encounter for screening for malignant neoplasm of colon: Secondary | ICD-10-CM | POA: Diagnosis not present

## 2022-12-13 LAB — COLOGUARD: COLOGUARD: NEGATIVE

## 2022-12-25 ENCOUNTER — Other Ambulatory Visit: Payer: Self-pay | Admitting: Nurse Practitioner

## 2023-02-02 DIAGNOSIS — L821 Other seborrheic keratosis: Secondary | ICD-10-CM | POA: Diagnosis not present

## 2023-02-02 DIAGNOSIS — M25561 Pain in right knee: Secondary | ICD-10-CM | POA: Diagnosis not present

## 2023-02-02 DIAGNOSIS — D1801 Hemangioma of skin and subcutaneous tissue: Secondary | ICD-10-CM | POA: Diagnosis not present

## 2023-02-02 DIAGNOSIS — L853 Xerosis cutis: Secondary | ICD-10-CM | POA: Diagnosis not present

## 2023-02-02 DIAGNOSIS — Z85828 Personal history of other malignant neoplasm of skin: Secondary | ICD-10-CM | POA: Diagnosis not present

## 2023-02-02 DIAGNOSIS — L918 Other hypertrophic disorders of the skin: Secondary | ICD-10-CM | POA: Diagnosis not present

## 2023-02-02 DIAGNOSIS — L812 Freckles: Secondary | ICD-10-CM | POA: Diagnosis not present

## 2023-02-18 ENCOUNTER — Other Ambulatory Visit: Payer: Self-pay

## 2023-02-18 MED ORDER — OLMESARTAN MEDOXOMIL 40 MG PO TABS: ORAL_TABLET | ORAL | 3 refills | Status: AC

## 2023-03-10 ENCOUNTER — Encounter: Payer: Self-pay | Admitting: Internal Medicine

## 2023-03-10 NOTE — Progress Notes (Signed)
[facility]      ADULT   &   ADOLESCENT      INTERNAL MEDICINE  Lucky Cowboy, M.D.          Rance Muir, ANP        Adela Glimpse, FNP  [facility] [address]  [place], [place]. [address] Telephone [phone] Telefax [phone]    Future Appointments  Date Time Provider Department  03/11/2023 11:30 AM Lucky Cowboy, MD [facility]  08/06/2023                   cpe 10:00 AM Adela Glimpse, NP [facility]  11/24/2023                   wellness 11:30 AM Adela Glimpse, NP [facility]    History of Present Illness:      This very nice 78 y.o. MWM presents for 6  month follow up with HTN, HLD, pAfib/Flutter, Pre-Diabetes and Vitamin D Deficiency.          Patient is treated for HTN  (2006) & BP has been controlled at home. Today's BP was  at goal  -                                      .  Patient has hx/o [facility] in 1988 - recovered completely.  Diagnosed Jan 2018 with Afib/Flutter & started on Eliquis. Then March 2018 had CV and Heart cath found Nl Coronary Aa. Also has CKD3a. Followed by Dr Jens Som. Patient has had no complaints of any cardiac type chest pain, palpitations, dyspnea /orthopnea / ND, dizziness, claudication or dependent edema.        Hyperlipidemia is controlled with diet & Simvastatin /Ezetimibe. Patient denies myalgias or other med SE's. Last Lipids were at goal:  Lab Results  Component Value Date   CHOL 161 08/06/2022   HDL 80 08/06/2022   LDLCALC 68 08/06/2022   TRIG 52 08/06/2022   CHOLHDL 2.0 08/06/2022     Also, the patient is monitored expectantly for glucose intolerance and has had no symptoms of reactive hypoglycemia, diabetic polys, paresthesias or visual blurring.  Last A1c was normal & at goal:  Lab Results  Component Value Date   HGBA1C 5.4 08/06/2022                                                       Further, the patient also has history of Vitamin D Deficiency ("24" /2008) and supplements vitamin D  without any suspected side-effects. Last vitamin D was at goal:  Lab Results  Component Value Date   VD25OH 69 08/06/2022       Current Outpatient Medications  Medication Instructions   amLODipine  5 mg  Daily   atenolol  25 mg Daily   VITAMIN D   5000 u 5,000 Int'l Units, Daily   diphenhydrAMINE  25 mg, At bedtime PRN   Eliquis 5 mg 2 times daily   ezetimibe  10 MG tablet TAKE 1 TABLET DAIL FOR CHOLESTEROL   (FREE JOINT HEALTH ADVANCE 1 tablet Every morning   hydrochlorothiazide  25 mg Daily   ibuprofen 200 MG tablet Oral   meclizine  25 MG tablet Take 1/2  to 1 tablet 2 to 3 x /day as needed for  "Sea Sickness", Dizziness / Vertigo   olmesartan 40 MG tablet Take  1 tablet  at Bedtime    simvastatin (40 MG tablet TAKE 1 TABLET AT BEDTIME    valACYclovir 500 MG tablet Take  1 tablet  Daily       Allergies  Allergen Reactions   Ultram [Tramadol] Anaphylaxis   Atorvastatin     Other reaction(s): Myalgias (intolerance)   Doxazosin Other (See Comments)    Dizziness    Telmisartan Hypertension and Other (See Comments)     PMHx:   Past Medical History:  Diagnosis Date   Anxiety    Coronary artery disease    Hyperlipidemia    Hypertension    ICH (intracerebral hemorrhage) (HCC)    Other testicular hypofunction    Permanent atrial fibrillation (HCC)    Prostate cancer (HCC)    Vitamin D deficiency      Immunization History  Administered Date(s) Administered   Fluad Quad(high Dose) 12/05/2019   Influenza, High Dose  01/31/2015, 12/03/2017   Influenza 12/19/2015, 12/24/2016   MMR 02/23/2005   PFIZER SARS-COV-2 Vacc  03/19/2019, 04/11/2019, 10/27/2019   Pneumococcal -13 01/31/2015   Pneumococcal -23 02/23/2010, 03/30/2016   Tdap 02/23/2005     Past Surgical History:  Procedure Laterality Date   COLONOSCOPY     LYMPHADENECTOMY Bilateral 06/23/2012   LYMPHADENECTOMY;  Surgeon: Crecencio Mc, MD;   ROBOT ASSISTED LAPAROSCOPIC RADICAL PROSTATECTOMY N/A 06/23/2012    Procedure: ROBOTIC ASSISTED LAPAROSCOPIC RADICAL PROSTATECTOMY LEVEL 2;  Surgeon: Crecencio Mc, MD;  Location: [facility];  Service: Urology;  Laterality: N/A;    FHx:    Reviewed / unchanged  SHx:    Reviewed / unchanged   Systems Review:  Constitutional: Denies fever, chills, wt changes, headaches, insomnia, fatigue, night sweats, change in appetite. Eyes: Denies redness, blurred vision, diplopia, discharge, itchy, watery eyes.  ENT: Denies discharge, congestion, post nasal drip, epistaxis, sore throat, earache, hearing loss, dental pain, tinnitus, vertigo, sinus pain, snoring.  CV: Denies chest pain, palpitations, irregular heartbeat, syncope, dyspnea, diaphoresis, orthopnea, PND, claudication or edema. Respiratory: denies cough, dyspnea, DOE, pleurisy, hoarseness, laryngitis, wheezing.  Gastrointestinal: Denies dysphagia, odynophagia, heartburn, reflux, water brash, abdominal pain or cramps, nausea, vomiting, bloating, diarrhea, constipation, hematemesis, melena, hematochezia  or hemorrhoids. Genitourinary: Denies dysuria, frequency, urgency, nocturia, hesitancy, discharge, hematuria or flank pain. Musculoskeletal: Denies arthralgias, myalgias, stiffness, jt. swelling, pain, limping or strain/sprain.  Skin: Denies pruritus, rash, hives, warts, acne, eczema or change in skin lesion(s). Neuro: No weakness, tremor, incoordination, spasms, paresthesia or pain. Psychiatric: Denies confusion, memory loss or sensory loss. Endo: Denies change in weight, skin or hair change.  Heme/Lymph: No excessive bleeding, bruising or enlarged lymph nodes.  Physical Exam  There were no vitals taken for this visit.  Appears  well nourished, well groomed  and in no distress.  Eyes: PERRLA, EOMs, conjunctiva no swelling or erythema. Sinuses: No frontal/maxillary tenderness ENT/Mouth: EAC's clear, TM's nl w/o erythema, bulging. Nares clear w/o erythema, swelling, exudates. Oropharynx clear without erythema or  exudates. Oral hygiene is good. Tongue normal, non obstructing. Hearing intact.  Neck: Supple. Thyroid not palpable. Car 2+/2+ without bruits, nodes or JVD. Chest: Respirations nl with BS clear & equal w/o rales, rhonchi, wheezing or stridor.  Cor: Heart sounds normal w/ regular rate and rhythm without sig. murmurs, gallops, clicks or rubs. Peripheral pulses normal and equal  without edema.  Abdomen: Soft & bowel sounds  normal. Non-tender w/o guarding, rebound, hernias, masses or organomegaly.  Lymphatics: Unremarkable.  Musculoskeletal: Full ROM all peripheral extremities, joint stability, 5/5 strength and normal gait.  Skin: Warm, dry without exposed rashes, lesions or ecchymosis apparent.  There is a 2.5 x 1  cm sebaceous cyst in the lower mid /middle back over the spine.  Neuro: Cranial nerves intact, reflexes equal bilaterally. Sensory-motor testing grossly intact. Tendon reflexes grossly intact.  Pysch: Alert & oriented x 3.  Insight and judgement nl & appropriate. No ideations.  Assessment and Plan:  1. Essential hypertension  - Continue medication, monitor blood pressure at home.  - Continue DASH diet.  Reminder to go to the ER if any CP,  SOB, nausea, dizziness, severe HA, changes vision/speech.   - CBC with Differential/Platelet - COMPLETE METABOLIC PANEL WITH GFR - Magnesium - TSH   2.  Hyperlipidemia, mixed  - Continue diet/meds, exercise,& lifestyle modifications.  - Continue monitor periodic cholesterol/liver & renal functions   - Lipid panel - TSH   3. Paroxysmal atrial fibrillation (HCC)  - TSH   4. Abnormal glucose  - Continue diet, exercise  - Lifestyle modifications.  - Monitor appropriate labs  - Hemoglobin A1c - Insulin, random   5. Vitamin D deficiency   - Continue supplementation    - VITAMIN D 25 Hydroxy    6. Stage 3a chronic kidney disease (HCC)  - COMPLETE METABOLIC PANEL WITH GFR - PTH   7. Medication management  - CBC with  Differential/Platelet - COMPLETE METABOLIC PANEL WITH GFR - Magnesium - Lipid panel - TSH - Hemoglobin A1c - Insulin, random - VITAMIN D 25 Hydroxy - PTH         Discussed  regular exercise, BP monitoring, weight control to achieve/maintain BMI less than 25 and discussed med and SE's. Recommended labs to assess and monitor clinical status with further disposition pending results of labs.  Patient to return for excision of sebaceous cyst.                                                                                                                                                         I discussed the assessment and treatment plan with the patient. The patient was provided an opportunity to ask questions and all were answered. The patient agreed with the plan and demonstrated an understanding of the instructions.  I provided over 30 minutes of exam, counseling, chart review and  complex critical decision making.        The patient was advised to call back or seek an in-person evaluation if the symptoms worsen or if the condition fails to improve as anticipated.   Marinus Maw, MD

## 2023-03-11 ENCOUNTER — Ambulatory Visit (INDEPENDENT_AMBULATORY_CARE_PROVIDER_SITE_OTHER): Payer: Medicare Other | Admitting: Internal Medicine

## 2023-03-11 ENCOUNTER — Encounter: Payer: Self-pay | Admitting: Internal Medicine

## 2023-03-11 VITALS — BP 118/70 | HR 86 | Temp 97.9°F | Resp 16 | Ht 77.0 in | Wt 243.6 lb

## 2023-03-11 DIAGNOSIS — N1831 Chronic kidney disease, stage 3a: Secondary | ICD-10-CM | POA: Diagnosis not present

## 2023-03-11 DIAGNOSIS — R7309 Other abnormal glucose: Secondary | ICD-10-CM

## 2023-03-11 DIAGNOSIS — I10 Essential (primary) hypertension: Secondary | ICD-10-CM

## 2023-03-11 DIAGNOSIS — E782 Mixed hyperlipidemia: Secondary | ICD-10-CM | POA: Diagnosis not present

## 2023-03-11 DIAGNOSIS — E559 Vitamin D deficiency, unspecified: Secondary | ICD-10-CM

## 2023-03-11 DIAGNOSIS — I480 Paroxysmal atrial fibrillation: Secondary | ICD-10-CM

## 2023-03-11 DIAGNOSIS — Z79899 Other long term (current) drug therapy: Secondary | ICD-10-CM | POA: Diagnosis not present

## 2023-03-12 LAB — CBC WITH DIFFERENTIAL/PLATELET
Absolute Lymphocytes: 1102 {cells}/uL (ref 850–3900)
Absolute Monocytes: 475 {cells}/uL (ref 200–950)
Basophils Absolute: 68 {cells}/uL (ref 0–200)
Basophils Relative: 1.8 %
Eosinophils Absolute: 160 {cells}/uL (ref 15–500)
Eosinophils Relative: 4.2 %
HCT: 42.7 % (ref 38.5–50.0)
Hemoglobin: 14.3 g/dL (ref 13.2–17.1)
MCH: 33.8 pg — ABNORMAL HIGH (ref 27.0–33.0)
MCHC: 33.5 g/dL (ref 32.0–36.0)
MCV: 100.9 fL — ABNORMAL HIGH (ref 80.0–100.0)
MPV: 9.5 fL (ref 7.5–12.5)
Monocytes Relative: 12.5 %
Neutro Abs: 1995 {cells}/uL (ref 1500–7800)
Neutrophils Relative %: 52.5 %
Platelets: 212 10*3/uL (ref 140–400)
RBC: 4.23 10*6/uL (ref 4.20–5.80)
RDW: 12.3 % (ref 11.0–15.0)
Total Lymphocyte: 29 %
WBC: 3.8 10*3/uL (ref 3.8–10.8)

## 2023-03-12 LAB — COMPLETE METABOLIC PANEL WITH GFR
AG Ratio: 1.6 (calc) (ref 1.0–2.5)
ALT: 24 U/L (ref 9–46)
AST: 30 U/L (ref 10–35)
Albumin: 4.3 g/dL (ref 3.6–5.1)
Alkaline phosphatase (APISO): 62 U/L (ref 35–144)
BUN: 17 mg/dL (ref 7–25)
CO2: 27 mmol/L (ref 20–32)
Calcium: 9.1 mg/dL (ref 8.6–10.3)
Chloride: 100 mmol/L (ref 98–110)
Creat: 1.16 mg/dL (ref 0.70–1.28)
Globulin: 2.7 g/dL (ref 1.9–3.7)
Glucose, Bld: 101 mg/dL — ABNORMAL HIGH (ref 65–99)
Potassium: 4.8 mmol/L (ref 3.5–5.3)
Sodium: 136 mmol/L (ref 135–146)
Total Bilirubin: 2.3 mg/dL — ABNORMAL HIGH (ref 0.2–1.2)
Total Protein: 7 g/dL (ref 6.1–8.1)
eGFR: 65 mL/min/{1.73_m2} (ref >=60)

## 2023-03-12 LAB — LIPID PANEL
Cholesterol: 179 mg/dL (ref <200)
HDL: 80 mg/dL (ref >=40)
LDL Cholesterol (Calc): 85 mg/dL
Non-HDL Cholesterol (Calc): 99 mg/dL (ref <130)
Total CHOL/HDL Ratio: 2.2 (calc) (ref <5.0)
Triglycerides: 64 mg/dL (ref <150)

## 2023-03-12 LAB — HEMOGLOBIN A1C
Hgb A1c MFr Bld: 5.2 %{Hb} (ref <5.7)
Mean Plasma Glucose: 103 mg/dL
eAG (mmol/L): 5.7 mmol/L

## 2023-03-12 LAB — TSH: TSH: 2.24 m[IU]/L (ref 0.40–4.50)

## 2023-03-12 LAB — VITAMIN D 25 HYDROXY (VIT D DEFICIENCY, FRACTURES): Vit D, 25-Hydroxy: 68 ng/mL (ref 30–100)

## 2023-03-12 LAB — INSULIN, RANDOM: Insulin: 9.9 u[IU]/mL

## 2023-03-12 LAB — MAGNESIUM: Magnesium: 2 mg/dL (ref 1.5–2.5)

## 2023-03-12 LAB — PARATHYROID HORMONE, INTACT (NO CA): PTH: 46 pg/mL (ref 16–77)

## 2023-03-14 ENCOUNTER — Encounter: Payer: Self-pay | Admitting: Nurse Practitioner

## 2023-04-07 ENCOUNTER — Telehealth: Payer: Self-pay | Admitting: Cardiology

## 2023-04-07 DIAGNOSIS — I712 Thoracic aortic aneurysm, without rupture, unspecified: Secondary | ICD-10-CM

## 2023-04-07 NOTE — Telephone Encounter (Signed)
Wife is calling in regard the Scan that the patient gets done every year. But there is no ordered. Please advise

## 2023-04-07 NOTE — Telephone Encounter (Signed)
Left message for patients wife, CTA is due in April. Will place the order for [facility], [address]

## 2023-05-03 ENCOUNTER — Other Ambulatory Visit: Payer: Self-pay

## 2023-05-03 DIAGNOSIS — I480 Paroxysmal atrial fibrillation: Secondary | ICD-10-CM

## 2023-05-03 MED ORDER — APIXABAN 5 MG PO TABS: 5.0000 mg | ORAL_TABLET | Freq: Two times a day (BID) | ORAL | 1 refills | Status: DC

## 2023-05-03 NOTE — Telephone Encounter (Signed)
 Pt last saw Dr Jens Som 07/10/22, last labs 03/11/23 Creat 1.16, age 78, weight 110.5kg, based on specified criteria pt is on appropriate dosage of Eliquis 5mg  BID for afib.  Will refill rx.

## 2023-05-24 ENCOUNTER — Ambulatory Visit (INDEPENDENT_AMBULATORY_CARE_PROVIDER_SITE_OTHER): Payer: Medicare Other | Admitting: Internal Medicine

## 2023-05-24 ENCOUNTER — Encounter: Payer: Self-pay | Admitting: Internal Medicine

## 2023-05-24 VITALS — BP 110/70 | HR 70 | Temp 97.5°F | Ht 77.0 in | Wt 235.4 lb

## 2023-05-24 DIAGNOSIS — Z9079 Acquired absence of other genital organ(s): Secondary | ICD-10-CM | POA: Diagnosis not present

## 2023-05-24 DIAGNOSIS — E785 Hyperlipidemia, unspecified: Secondary | ICD-10-CM

## 2023-05-24 DIAGNOSIS — H40003 Preglaucoma, unspecified, bilateral: Secondary | ICD-10-CM

## 2023-05-24 DIAGNOSIS — I10 Essential (primary) hypertension: Secondary | ICD-10-CM | POA: Diagnosis not present

## 2023-05-24 DIAGNOSIS — I2510 Atherosclerotic heart disease of native coronary artery without angina pectoris: Secondary | ICD-10-CM | POA: Diagnosis not present

## 2023-05-24 DIAGNOSIS — D7589 Other specified diseases of blood and blood-forming organs: Secondary | ICD-10-CM | POA: Diagnosis not present

## 2023-05-24 DIAGNOSIS — I712 Thoracic aortic aneurysm, without rupture, unspecified: Secondary | ICD-10-CM

## 2023-05-24 DIAGNOSIS — Z7901 Long term (current) use of anticoagulants: Secondary | ICD-10-CM | POA: Diagnosis not present

## 2023-05-24 DIAGNOSIS — Z8679 Personal history of other diseases of the circulatory system: Secondary | ICD-10-CM

## 2023-05-24 DIAGNOSIS — E538 Deficiency of other specified B group vitamins: Secondary | ICD-10-CM

## 2023-05-24 DIAGNOSIS — I480 Paroxysmal atrial fibrillation: Secondary | ICD-10-CM | POA: Diagnosis not present

## 2023-05-24 NOTE — Assessment & Plan Note (Signed)
 Chronic AFib with episodes of atrial flutter occurs 10-20% of the time. Managed with Eliquis, which is well-tolerated despite a history of subarachnoid hemorrhage. The risk of ischemic stroke outweighs the risk of hemorrhagic stroke, justifying anticoagulation. Ablation is recommended due to technological advancements, potentially reducing stroke risk and eliminating the need for anticoagulation. Cryoablation is preferred for its shorter procedure time and safety, though pulse field ablation with advanced mapping systems is also an option. Continue Eliquis for anticoagulation. Discuss ablation options with a cardiologist, focusing on cryoablation or pulse field ablation.

## 2023-05-24 NOTE — Patient Instructions (Signed)
 Atrial fibrillation (AFib) ablation is most commonly performed via pulmonary vein isolation (PVI), and the two most widely validated catheter?based techniques are radiofrequency ablation and cryoballoon ablation. In selected cases, surgical and hybrid approaches may also be considered. Below is an overview of the leading methods with a discussion of their risks, benefits, and the evidence supporting their use.  1. Radiofrequency (RF) Ablation Overview: RF ablation uses a catheter to deliver heat energy (via radiofrequency waves) to create point?by?point lesions around the pulmonary veins. The goal is to electrically isolate the veins from the left atrium and thus prevent erratic electrical triggers. Benefits: Customization: The operator can tailor lesion placement and adjust energy delivery based on real?time mapping feedback. Long?term Data: Numerous studies and randomized trials have demonstrated its efficacy, with many centers reporting durable outcomes over time. Flexibility: It is versatile, allowing additional lesion sets if non?pulmonary vein triggers are identified. Risks: Procedural Complexity: The point?by?point technique requires significant operator experience and may result in longer procedure times. Complications: Potential risks include cardiac tamponade, pulmonary vein stenosis, and, rarely, esophageal injury leading to atrioesophageal fistula. Recurrence: Although many patients experience improvement, AFib may recur, necessitating repeat procedures. Evidence: Multiple randomized trials and meta-analyses have validated RF ablation's effectiveness, forming the basis of current guidelines from major cardiovascular societies.  2. Cryoballoon Ablation Overview: Cryoballoon ablation employs a balloon catheter that is inflated at the ostia of the pulmonary veins and cooled to very low temperatures. This freezing process creates a circumferential lesion to isolate the  veins. Benefits: Ease of Use: The cryoballoon's "single-shot" design tends to simplify the procedure and reduce operator dependency compared to the point-by-point approach. Procedural Efficiency: Many centers report shorter procedure times and a more reproducible lesion set, which can translate into similar success rates as RF ablation. Comparable Outcomes: Large randomized studies, such as the FIRE AND ICE trial, have shown that cryoballoon ablation is non-inferior to RF ablation in preventing AFib recurrence. Risks: Phrenic Nerve Injury: One of the more unique risks is transient or, rarely, persistent phrenic nerve injury, which can affect diaphragm function. Balloon Positioning: Achieving an optimal seal with the balloon is critical; incomplete isolation may require additional "touch-up" lesions using RF energy. Vascular Access Complications: As with any transvenous procedure, there are risks of bleeding or vascular complications at the puncture site. Evidence: The FIRE AND ICE trial and subsequent studies have provided robust evidence supporting the safety and efficacy of cryoballoon ablation, making it a frontline option for many patients.  3. Surgical (Maze Procedure) and Hybrid Approaches Overview: Surgical Maze: Involves creating a "maze" of scar tissue surgically to disrupt the abnormal electrical circuits. It is typically reserved for patients who are undergoing other cardiac surgeries (e.g., valve repair or coronary artery bypass grafting). Hybrid Approaches: Combine surgical epicardial ablation with endocardial catheter-based techniques, aiming to improve success rates in complex or persistent AFib. Benefits: Efficacy in Complex Cases: Surgical and hybrid approaches can be especially beneficial in patients with persistent or longstanding AFib who may have more extensive atrial remodeling. Comprehensive Lesion Sets: These methods allow for more extensive lesion creation beyond the  pulmonary veins, addressing additional arrhythmogenic substrates. Risks: Invasiveness: Surgical procedures carry the inherent risks of open-heart or minimally invasive cardiac surgery, including infection, bleeding, and a longer recovery period. Higher Morbidity: Compared to catheter-based ablation, these approaches have a higher risk profile and are generally reserved for selected patients or those with concomitant cardiac conditions. Evidence: Guidelines and long-term follow-up studies support the use of surgical and hybrid ablation for  specific patient subsets, particularly when catheter-based techniques have failed or in the context of concomitant cardiac surgery.  Summary and Evidence-Based Considerations Efficacy: Both RF and cryoballoon ablation have strong evidence backing their use in paroxysmal AFib, with similar long-term success rates. Safety Profiles: Cryoballoon ablation is often favored for its simplicity and reproducibility, while RF ablation offers flexibility in lesion creation. Patient Selection: The choice of method depends on factors such as the type and duration of AFib, left atrial anatomy, patient comorbidities, and operator expertise. Guidelines: Current recommendations from major cardiovascular societies (e.g., ACC, ESC) are based on numerous randomized controlled trials and meta-analyses that have demonstrated the benefits and manageable risks associated with these ablation techniques. In summary, the "best" ablation method for AFib is tailored to the individual patient. For many patients with paroxysmal AFib, both RF and cryoballoon ablation are excellent choices, each with its own risk-benefit profile. Surgical and hybrid approaches remain important options for those with persistent AFib or when concomitant cardiac surgery is indicated. This synthesis is based on a broad range of high-quality studies and guidelines that continue to evolve as new techniques and technologies  emerge in the field.   Between radiofrequency (RF) ablation and cryoballoon ablation, current evidence and guidelines generally suggest that both approaches are effective for paroxysmal AFib, with neither method being universally "better." However, some nuances exist: Preference Between RF and Cryoballoon Ablation Efficacy and Outcomes: Large-scale studies such as the FIRE AND ICE trial have demonstrated that cryoballoon ablation is non-inferior to RF ablation in terms of arrhythmia-free survival. This means that, in many cases, both methods yield similar long-term outcomes. Procedure Simplicity and Reproducibility: Cryoballoon ablation is often favored for its "single-shot" design. This simplicity can lead to shorter procedure times and less variability between operators. In contrast, RF ablation requires a point-by-point approach that is more operator-dependent and may be more technically challenging. Individual Patient Factors: The choice often depends on the specific anatomy, type of AFib (paroxysmal vs. persistent), and the operator's experience. Many centers have increasingly adopted cryoballoon techniques due to their reproducibility and streamlined workflow. Overall, while both methods are well validated, cryoballoon ablation is growing in popularity, particularly for patients with paroxysmal AFib, due to its ease of use and consistent outcomes. Newer Approaches Under Investigation Pulsed Field Ablation (PFA): One of the most promising emerging technologies is pulsed field ablation. PFA uses high-voltage electrical pulses to create cell membrane pores (irreversible electroporation) that selectively ablate myocardial tissue while sparing surrounding structures like the esophagus and phrenic nerve. Early studies suggest it may offer faster procedures and a better safety profile. Improved [employer] account manager Technologies: Advances in electroanatomic mapping systems and contact  force-sensing catheters are also being developed to improve lesion formation and procedural safety for RF ablation. These tools are aimed at enhancing precision and reducing complications. Hybrid and Combined Approaches: There is ongoing research into combining endocardial catheter techniques with surgical or epicardial ablation methods, especially for patients with persistent or long-standing AFib. These hybrid approaches may address more complex arrhythmogenic substrates. Conclusion In summary, while both RF and cryoballoon ablation are well-established, the trend toward cryoballoon ablation--driven by its simplicity and reproducibility--has made it a preferred choice in many centers for paroxysmal AFib. Meanwhile, innovative approaches like pulsed field ablation are under active investigation and could potentially redefine AFib treatment by offering safer and more efficient ablation with fewer collateral effects.

## 2023-05-24 NOTE — Assessment & Plan Note (Signed)
 Previous subarachnoid hemorrhage likely related to hypertension, with no current symptoms or recurrence. This history is considered in managing anticoagulation therapy for AFib

## 2023-05-24 NOTE — Assessment & Plan Note (Signed)
 Couldn't find evidence of this. Previously diagnosed with CAD, but recent heart catheterization showed no significant coronary artery disease. The low cholesterol goal may relate to this diagnosis or the history of subarachnoid hemorrhage. Likely has genetic protection from cholesterol-induced heart disease, as indicated by high HDL levels. Review cholesterol management with a cardiologist.

## 2023-05-24 NOTE — Progress Notes (Signed)
 [facility]  Phone: [phone]  - Medical Office Visit -  Visit Date: 05/24/2023 Patient: Kenneth Russell   DOB: 17-Oct-1945   78 y.o. Male  MRN: [id number] Patient Care Team: Lula Olszewski, MD as PCP - General (Internal Medicine) Jens Som Madolyn Frieze, MD as PCP - Cardiology (Cardiology) Heloise Purpura, MD as Consulting Physician (Urology) Louis Meckel, MD (Inactive) as Consulting Physician (Gastroenterology) Jene Every, MD as Consulting Physician (Orthopedic Surgery) Donzetta Starch, MD as Consulting Physician (Dermatology) Bond, Doran Stabler, MD as Referring Physician (Ophthalmology) Gayla Doss, MD as Referring Physician (Cardiology) Today's Health Care Provider: Lula Olszewski, MD  ===========================================    Chief Complaint / Reason for Visit: new pt (Pt is present for est care with pcp. Just had CPE with Dr. Oneta Rack in Jan.)   Background: 78 y.o. male who has Essential (primary) hypertension; Vitamin D deficiency, unspecified; History of prostate cancer; BMI 27.0-27.9,adult; Glaucoma suspect, bilateral; Paroxysmal atrial fibrillation (HCC); Chronic kidney disease (CKD), active medical management without dialysis, stage 2 (mild); Tortuous aorta (HCC); Thoracic aortic aneurysm without rupture (HCC); Coronary artery disease involving native coronary artery of native heart without angina pectoris; Age-related nuclear cataract, right eye; Dyslipidemia; History of subarachnoid hemorrhage; and On continuous oral anticoagulation on their problem list.  Discussed the use of AI scribe software for clinical note transcription with the patient, who gave verbal consent to proceed.  History of Present Illness This is a 78 year old male with atrial flutter and coronary artery disease who presents for follow-up on his cardiac conditions.  He experiences episodes of atrial flutter approximately four to five hours a day, frequently detected by  his watch. He has been taking Eliquis for three to four years to manage this condition, although he notes the medication is expensive. Despite a history of a brain bleed, he has not experienced significant bleeding issues while on Eliquis, even with frequent minor cuts while working in the yard.  He has a history of coronary artery disease diagnosed four years ago, although a heart catheterization in 2018 showed no evidence of heart disease. He maintains a very active lifestyle, riding bikes three to four times a week, and his cholesterol levels are reportedly very good.  He has a history of a subarachnoid hemorrhage, which occurred prior to 2019. His blood pressure is well-controlled with medication, and he monitors it regularly at home.  He has a history of prostate cancer, for which he underwent a prostatectomy. His PSA levels are monitored annually, and he has been cancer-free for ten years.  He denies any history of smoking and maintains an active lifestyle, working in the yard frequently. He ensures to stay hydrated, especially while working outdoors.     Problem overviews updated today: Problem  On Continuous Oral Anticoagulation   Eliquis for aflutter paroxysms.   History subarachnoid hemorrhage from just running down a hallway.   Thoracic Aortic Aneurysm Without Rupture (Hcc)   Per CT cardiac angiogram 06/04/2019 at [facility] clinic Borderline aneurysmal dilation of the ascending aorta up to 4.2 cm Consider 1 year follow up CTA/MRA   Coronary Artery Disease Involving Native Coronary Artery of Native Heart Without Angina Pectoris   Medications: not yet discussed Lab Results  Component Value Date   HDL 80 03/11/2023   HDL 80 08/06/2022   HDL 68 08/05/2021   CHOLHDL 2.2 03/11/2023   CHOLHDL 2.0 08/06/2022   CHOLHDL 2.5 08/05/2021   Lab Results  Component Value Date   LDLCALC  85 03/11/2023   LDLCALC 68 08/06/2022   LDLCALC 87 08/05/2021   Lab Results  Component Value Date    TRIG 64 03/11/2023   TRIG 52 08/06/2022   TRIG 82 08/05/2021   Lab Results  Component Value Date   CHOL 179 03/11/2023   CHOL 161 08/06/2022   CHOL 172 08/05/2021   The 10-year ASCVD risk score (Arnett DK, et al., 2019) is: 23.3%   Values used to calculate the score:     Age: 82 years     Sex: Male     Is Non-Hispanic African American: No     Diabetic: No     Tobacco smoker: No     Systolic Blood Pressure: 110 mmHg     Is BP treated: Yes     HDL Cholesterol: 80 mg/dL     Total Cholesterol: 179 mg/dL Lab Results  Component Value Date   ALT 24 03/11/2023   AST 30 03/11/2023   ALKPHOS 65 07/08/2016   TSH 2.24 03/11/2023   HGBA1C 5.2 03/11/2023   Body mass index is 27.91 kg/m.  Lipoprotein(a), Apolipoprotein B (ApoB), and High-sensitivity C-reactive protein (hs-CRP) No results found for: "HSCRP", "LIPOA" Improving Your Cholesterol: Diet: Focus on a Mediterranean-style diet, limit saturated fats and sugars, and increase omega-3 fatty acids (fish, flaxseeds,nuts,extra virgin olive oil, avocados). Exercise: Engage in regular physical activity (aerobic exercises are particularly beneficial for HDL). Weight Management: Maintain a healthy weight. Smoking Cessation: Quitting smoking improves cholesterol levels.    Chronic Kidney Disease (Ckd), Active Medical Management Without Dialysis, Stage 2 (Mild)   Nephrologist: No care team member to display  Medicines:     ARB/ACEI:  not yet discussed    SGLT2 inhibitor:   not yet discussed  Imaging:  Labwork:  Creat (mg/dL)  Date Value  29/52/8413 1.16  08/06/2022 1.24  11/13/2021 1.28  08/05/2021 1.49 (H)  12/27/2020 1.31 (H)  07/18/2020 1.23 (H)  04/09/2020 1.35 (H)  12/12/2019 1.31 (H)  08/14/2019 1.33 (H)  06/14/2019 1.54 (H)  05/11/2019 1.37 (H)  04/13/2018 1.23 (H)  10/05/2017 2.48 (H)  02/11/2017 1.26 (H)  07/08/2016 1.09  02/12/2016 1.06  09/12/2015 1.08  01/31/2015 1.02  04/24/2014 1.07  08/29/2013 1.08   03/13/2013 1.06   Creatinine, Ser (mg/dL)  Date Value  24/40/1027 1.31 (H)  06/04/2022 1.15  06/10/2021 1.60 (H)  05/29/2021 1.59 (H)  05/14/2020 1.35 (H)  06/07/2012 1.04   No results found for: "GFR" eGFR  Date Value  03/11/2023 65 mL/min/1.76m2  08/06/2022 60 mL/min/1.37m2  06/04/2022 66 mL/min/1.73  11/13/2021 58 mL/min/1.48m2 (L)  08/05/2021 48 mL/min/1.104m2 (L)  05/29/2021 45 mL/min/1.73 (L)  12/27/2020 57 mL/min/1.92m2 (L)  05/14/2020 55 mL/min/1.73 (L)    Lab Results  Component Value Date   LABURIC 4.7 07/18/2020   VD25OH 68 03/11/2023   NA 136 03/11/2023   K 4.8 03/11/2023   CL 100 03/11/2023   CO2 27 03/11/2023   CALCIUM 9.1 03/11/2023   PROT 7.0 03/11/2023   ALBUMIN 4.0 07/08/2016   HGB 14.3 03/11/2023   HCT 42.7 03/11/2023   PTH 46 03/11/2023   PSA <0.04 08/06/2022   HGBA1C 5.2 03/11/2023   Lab Results  Component Value Date   COLORURINE YELLOW 08/06/2022   APPEARANCEUR CLEAR 08/06/2022   LABSPEC 1.010 08/06/2022   PHURINE 6.0 08/06/2022   GLUCOSEU NEGATIVE 08/06/2022   HGBUR NEGATIVE 08/06/2022   BILIRUBINUR NEGATIVE 02/12/2016   KETONESUR NEGATIVE 08/06/2022   PROTEINUR NEGATIVE 08/06/2022   NITRITE NEGATIVE 08/06/2022  LEUKOCYTESUR NEGATIVE 08/06/2022   MICROALBUR 3.1 08/06/2022    Symptoms to Watch For: changes in urination, swelling, fatigue, chest pain, or shortness of breath  Medicines to Avoid:  diuretics (fluid pills), NSAIDs, proton pump inhibitor (PPI) stomach acid reducer, contrasted imaging studies, aminoglycosides, acyclovir, phosphate-based bowel prep, baclofen  Lifestyle and Nutrition:  Limit sodium, potassium, phosphorus and protein intake, consider nutrition consult, smoking avoidance, exercise regularly, manage weight, and reduce stress, control blood sugar, avoid dehydration (may need early emergency room visits for nausea vomiting diarrhea)  Vaccinations: flu/COVID/pneumonia/hepatitis B vaccinations are more important  in kidney disease     Vitamin D Deficiency, Unspecified  Dyslipidemia   Low Density Lipoprotein (LDL cholesterol) goal < 50 per prior record(s)    History of Subarachnoid Hemorrhage  Essential (Primary) Hypertension   BP Readings from Last 30 Encounters:  05/24/23 110/70  03/11/23 118/70  11/24/22 110/70  10/01/22 125/83  08/06/22 112/78  07/10/22 108/62  08/05/21 100/60  05/16/21 114/74  01/21/21 116/68  01/14/21 118/80  01/08/21 120/88  12/27/20 128/90  09/05/20 118/74  07/18/20 108/74  05/14/20 118/78  04/09/20 120/82  12/12/19 126/88  10/26/19 118/82  08/14/19 120/80  06/14/19 104/68  05/11/19 124/60  07/29/18 114/74  04/13/18 126/84  10/05/17 (!) 88/56  02/11/17 124/84  03/30/16 126/82  02/12/16 136/82  10/10/15 100/60  09/12/15 122/80  05/28/15 118/76   Current hypertension medications:       Sig   amLODipine (NORVASC) 5 MG tablet (Taking) Take 1 tablet (5 mg total) by mouth daily.   atenolol (TENORMIN) 25 MG tablet (Taking) TAKE 1 TABLET BY MOUTH DAILY   hydrochlorothiazide (HYDRODIURIL) 25 MG tablet (Taking) TAKE 1 TABLET BY MOUTH DAILY   olmesartan (BENICAR) 40 MG tablet (Taking) Take  1 tablet  at Bedtime for BP      Lab Results  Component Value Date   NA 136 03/11/2023   K 4.8 03/11/2023   CREATININE 1.16 03/11/2023      Paroxysmal Atrial Fibrillation (Hcc)   Also with flutter Prior cardioversion x 2 Ablation discussed prior    Bmi 27.0-27.9,Adult  Glaucoma Suspect, Bilateral   Following with ophthalmology    Age-Related Nuclear Cataract, Right Eye  History of Prostate Cancer  Mass of Foot (Resolved)  Gilbert's Syndrome (Resolved)  B12 Deficiency (Resolved)   Lab Results  Component Value Date/Time   ZOXWRUEA54 1,771 (H) 08/05/2021 11:07 AM   VITAMINB12 380 08/29/2013 05:47 PM      Cad in Native Artery (Resolved)   Ct evidence of circumflex narrowing   Testosterone Deficiency (Resolved)  Personal History of Other Diseases  of The Circulatory System (Resolved)  Nuclear Sclerosis (Resolved)  Primary Malignant Neoplasm of Prostate (Hcc) (Resolved)    Medications updated/reviewed: Current Outpatient Medications on File Prior to Visit  Medication Sig   amLODipine (NORVASC) 5 MG tablet Take 1 tablet (5 mg total) by mouth daily.   apixaban (ELIQUIS) 5 MG TABS tablet Take 1 tablet (5 mg total) by mouth 2 (two) times daily.   atenolol (TENORMIN) 25 MG tablet TAKE 1 TABLET BY MOUTH DAILY   Cholecalciferol (VITAMIN D3) 5000 UNITS CAPS Take 5,000 Int'l Units by mouth daily.   diphenhydrAMINE (BENADRYL) 25 mg capsule Take 25 mg by mouth at bedtime as needed for sleep.   ezetimibe (ZETIA) 10 MG tablet TAKE 1 TABLET BY MOUTH DAILY FOR CHOLESTEROL   Glucos-Chond-Hyal Ac-Ca Fructo (MOVE FREE JOINT HEALTH ADVANCE) TABS Take 1 tablet by mouth every morning.   hydrochlorothiazide (HYDRODIURIL)  25 MG tablet TAKE 1 TABLET BY MOUTH DAILY   ibuprofen (ADVIL) 200 MG tablet Take by mouth.   olmesartan (BENICAR) 40 MG tablet Take  1 tablet  at Bedtime for BP   simvastatin (ZOCOR) 40 MG tablet TAKE 1 TABLET BY MOUTH AT BEDTIME FOR CHOLESTEROL   valACYclovir (VALTREX) 500 MG tablet Take  1 tablet  Daily  to Prevent Fever Blisters                                       /                                TAKE                               BY                      MOUTH   meclizine (ANTIVERT) 25 MG tablet Take 1/2 to 1 tablet 2 to 3 x /day as needed for  "Sea Sickness", Dizziness / Vertigo (Patient not taking: Reported on 05/24/2023)   No current facility-administered medications on file prior to visit.  There are no discontinued medications. Current Meds  Medication Sig   amLODipine (NORVASC) 5 MG tablet Take 1 tablet (5 mg total) by mouth daily.   apixaban (ELIQUIS) 5 MG TABS tablet Take 1 tablet (5 mg total) by mouth 2 (two) times daily.   atenolol (TENORMIN) 25 MG tablet TAKE 1 TABLET BY MOUTH DAILY   Cholecalciferol (VITAMIN D3) 5000  UNITS CAPS Take 5,000 Int'l Units by mouth daily.   diphenhydrAMINE (BENADRYL) 25 mg capsule Take 25 mg by mouth at bedtime as needed for sleep.   ezetimibe (ZETIA) 10 MG tablet TAKE 1 TABLET BY MOUTH DAILY FOR CHOLESTEROL   Glucos-Chond-Hyal Ac-Ca Fructo (MOVE FREE JOINT HEALTH ADVANCE) TABS Take 1 tablet by mouth every morning.   hydrochlorothiazide (HYDRODIURIL) 25 MG tablet TAKE 1 TABLET BY MOUTH DAILY   ibuprofen (ADVIL) 200 MG tablet Take by mouth.   olmesartan (BENICAR) 40 MG tablet Take  1 tablet  at Bedtime for BP   simvastatin (ZOCOR) 40 MG tablet TAKE 1 TABLET BY MOUTH AT BEDTIME FOR CHOLESTEROL   valACYclovir (VALTREX) 500 MG tablet Take  1 tablet  Daily  to Prevent Fever Blisters                                       /                                TAKE                               BY                      MOUTH    Allergies:   Allergies as of 05/24/2023 - Review Complete 05/24/2023  Allergen Reaction Noted   Ultram [tramadol] Anaphylaxis 03/10/2013   Atorvastatin  06/28/2019   Doxazosin Other (See Comments) 04/03/2016   Past Medical  History:  has a past medical history of Anxiety, Atrial fibrillation (HCC), B12 deficiency (09/05/2020), CAD in native artery (06/28/2019), Coronary artery disease, Gilbert's syndrome (08/06/2021), Hyperlipidemia, Hypertension, ICH (intracerebral hemorrhage) (HCC), Mass of foot (07/08/2022), Nuclear sclerosis (03/17/2013), Other testicular hypofunction, Permanent atrial fibrillation (HCC), Personal history of other diseases of the circulatory system (07/27/2016), Primary malignant neoplasm of prostate (HCC) (03/13/2013), Prostate cancer (HCC), Testosterone deficiency (03/22/2017), and Vitamin D deficiency. Past Surgical History:   has a past surgical history that includes Colonoscopy; Robot assisted laparoscopic radical prostatectomy (N/A, 06/23/2012); Lymphadenectomy (Bilateral, 06/23/2012); and Excision mass lower extremeties (Left, 10/01/2022). Social  History:   reports that he has never smoked. He has never used smokeless tobacco. He reports current alcohol use of about 5.0 standard drinks of alcohol per week. He reports that he does not use drugs. Family History:  family history includes Colon cancer in his father. Depression Screen and Health Maintenance:    11/24/2022   12:38 PM 11/13/2021    5:37 AM 09/05/2020   11:57 AM 07/18/2020   12:44 AM  PHQ 2/9 Scores  PHQ - 2 Score 0 0 0 0   Health Maintenance  Topic Date Due   Zoster Vaccines- Shingrix (1 of 2) Never done   DTaP/Tdap/Td (2 - Td or Tdap) 02/24/2015   COVID-19 Vaccine (7 - 2024-25 season) 06/09/2023 (Originally 10/25/2022)   Medicare Annual Wellness (AWV)  11/24/2023   Pneumonia Vaccine 71+ Years old  Completed   INFLUENZA VACCINE  Completed   Hepatitis C Screening  Completed   HPV VACCINES  Aged Out   Colonoscopy  Discontinued   Immunization History  Administered Date(s) Administered   Fluad Quad(high Dose 65+) 12/05/2019   Influenza, High Dose Seasonal PF 01/31/2015, 12/03/2017   Influenza-Unspecified 12/19/2015, 12/24/2016   MMR 02/23/2005   PFIZER(Purple Top)SARS-COV-2 Vaccination 03/19/2019, 04/11/2019, 10/27/2019, 06/01/2020   Pfizer Covid-19 Vaccine Bivalent Booster 84yrs & up 11/24/2020, 06/28/2021   Pneumococcal Conjugate-13 01/31/2015   Pneumococcal Polysaccharide-23 02/23/2010, 03/30/2016   Tdap 02/23/2005     Objective   Physical ExamBP 110/70   Pulse 70   Temp (!) 97.5 F (36.4 C) (Temporal)   Ht 6\' 5"  (1.956 m)   Wt 235 lb 6.4 oz (106.8 kg)   SpO2 98%   BMI 27.91 kg/m  Wt Readings from Last 10 Encounters:  05/24/23 235 lb 6.4 oz (106.8 kg)  03/11/23 243 lb 9.6 oz (110.5 kg)  11/24/22 236 lb 3.2 oz (107.1 kg)  10/01/22 231 lb 7.7 oz (105 kg)  08/06/22 231 lb 12.8 oz (105.1 kg)  07/10/22 232 lb 12.8 oz (105.6 kg)  11/13/21 237 lb (107.5 kg)  08/05/21 235 lb (106.6 kg)  05/16/21 241 lb (109.3 kg)  01/21/21 233 lb 3.2 oz (105.8 kg)  Vital  signs reviewed.  Nursing notes reviewed. Weight trend reviewed. Abnormalities and problem-specific physical exam findings:  appears younger than stated age, tall man General Appearance:  Well developed, well nourished, well-groomed, healthy-appearing male with Body mass index is 27.91 kg/m. No acute distress appreciable.   Skin: Clear and well-hydrated. Pulmonary:  Normal work of breathing at rest, no respiratory distress apparent. SpO2: 98 %  Musculoskeletal: He demonstrates smooth and coordinated movements throughout all major joints.All extremities are intact.  Neurological:  Awake, alert, oriented, and engaged.  No obvious focal neurological deficits or cognitive impairments.  Sensorium seems unclouded.  Psychiatric:  Appropriate mood, pleasant and cooperative demeanor, cheerful and engaged during the exam  Reviewed Results & Data Results LABS Creatinine:  2.48 mg/dL (2130)  DIAGNOSTIC Cardiac catheterization: normal (2018) Echocardiogram: left ventricular hypertrophy, severe left atrial enlargement, moderate right atrial enlargement (2021)    No results found for any visits on 05/24/23.  Office Visit on 03/11/2023  Component Date Value   WBC 03/11/2023 3.8    RBC 03/11/2023 4.23    Hemoglobin 03/11/2023 14.3    HCT 03/11/2023 42.7    MCV 03/11/2023 100.9 (H)    MCH 03/11/2023 33.8 (H)    MCHC 03/11/2023 33.5    RDW 03/11/2023 12.3    Platelets 03/11/2023 212    MPV 03/11/2023 9.5    Neutro Abs 03/11/2023 1,995    Absolute Lymphocytes 03/11/2023 1,102    Absolute Monocytes 03/11/2023 475    Eosinophils Absolute 03/11/2023 160    Basophils Absolute 03/11/2023 68    Neutrophils Relative % 03/11/2023 52.5    Total Lymphocyte 03/11/2023 29.0    Monocytes Relative 03/11/2023 12.5    Eosinophils Relative 03/11/2023 4.2    Basophils Relative 03/11/2023 1.8    Glucose, Bld 03/11/2023 101 (H)    BUN 03/11/2023 17    Creat 03/11/2023 1.16    eGFR 03/11/2023 65     BUN/Creatinine Ratio 03/11/2023 SEE NOTE:    Sodium 03/11/2023 136    Potassium 03/11/2023 4.8    Chloride 03/11/2023 100    CO2 03/11/2023 27    Calcium 03/11/2023 9.1    Total Protein 03/11/2023 7.0    Albumin 03/11/2023 4.3    Globulin 03/11/2023 2.7    AG Ratio 03/11/2023 1.6    Total Bilirubin 03/11/2023 2.3 (H)    Alkaline phosphatase (AP* 03/11/2023 62    AST 03/11/2023 30    ALT 03/11/2023 24    Magnesium 03/11/2023 2.0    Cholesterol 03/11/2023 179    HDL 03/11/2023 80    Triglycerides 03/11/2023 64    LDL Cholesterol (Calc) 03/11/2023 85    Total CHOL/HDL Ratio 03/11/2023 2.2    Non-HDL Cholesterol (Cal* 03/11/2023 99    TSH 03/11/2023 2.24    Hgb A1c MFr Bld 03/11/2023 5.2    Mean Plasma Glucose 03/11/2023 103    eAG (mmol/L) 03/11/2023 5.7    Insulin 03/11/2023 9.9    Vit D, 25-Hydroxy 03/11/2023 68    PTH 03/11/2023 46   Office Visit on 11/24/2022  Component Date Value   COLOGUARD 12/06/2022 Negative   Admission on 10/01/2022, Discharged on 10/01/2022  Component Date Value   Sodium 09/28/2022 137    Potassium 09/28/2022 4.8    Chloride 09/28/2022 101    CO2 09/28/2022 27    Glucose, Bld 09/28/2022 103 (H)    BUN 09/28/2022 19    Creatinine, Ser 09/28/2022 1.31 (H)    Calcium 09/28/2022 8.9    GFR, Estimated 09/28/2022 56 (L)    Anion gap 09/28/2022 9    SURGICAL PATHOLOGY 10/01/2022                     Value:SURGICAL PATHOLOGY CASE: [id number] PATIENT: Timmothy Euler Surgical Pathology Report     Clinical History: Mass of left foot (lp)     FINAL MICROSCOPIC DIAGNOSIS:  A. MASS, LEFT FOOT, EXCISION: - Fibroma  GROSS DESCRIPTION:  Received in formalin is a 2.9 x 2.5 x 1.7 cm well-defined firm nodular tissue partially covered by a pink-white to pink-red smooth and glistening to focally granular surface.  Cut surface is pink-white to pale yellow, vaguely nodular.  The presumed margin of resection is inked black and a  representative  section is submitted in 1 block.  SW 10/01/2022   Final Diagnosis performed by Clifton James, MD.   Electronically signed 10/02/2022 Technical component performed at [facility], [address] [place], [place] [address].  Professional component performed at [facility], [address] [place], [place] [address].  Immunohistochemistry Technical component (if applicable) was performed at [facility]. [address], [place], [place] [address].   IMMUNOHISTOCHEMISTRY DISCLAIMER (if applicable): Some of these immunohistochemical stains may have been developed and the performance characteristics determine by [facility]. Some may not have been cleared or approved by the U.S. Food and Drug Administration. The FDA has determined that such clearance or approval is not necessary. This test is used for clinical purposes. It should not be regarded as investigational or for research. This laboratory is certified under the Clinical Laboratory Improvement Amendments of 1988 (CLIA-88) as qualified to perform high complexity clinical laboratory testing.  The controls stained appropriately.   IHC stains are performed on formalin fixed, paraffin embedded tissue using a 3,3"diaminobenzidine (DAB) chromogen and Leica Bond Autostainer System. The staining intensity of the nucleus is score manually and is reported as the percentage of tumo                         r cell nuclei demonstrating specific nuclear staining. The specimens are fixed in 10% Neutral Formalin for at least 6 hours and up to 72hrs. These tests are validated on decalcified tissue. Results should be interpreted with caution given the possibility of false negative results on decalcified specimens. Antibody Clones are as follows ER-clone [id number], PR-clone 16, Ki67- clone [id number]. Some of these immunohistochemical stains may have been developed  and the performance characteristics determined by [facility].   Office Visit on 08/06/2022  Component Date Value   WBC 08/06/2022 4.1    RBC 08/06/2022 4.13 (L)    Hemoglobin 08/06/2022 14.0    HCT 08/06/2022 41.1    MCV 08/06/2022 99.5    MCH 08/06/2022 33.9 (H)    MCHC 08/06/2022 34.1    RDW 08/06/2022 12.1    Platelets 08/06/2022 186    MPV 08/06/2022 9.5    Neutro Abs 08/06/2022 2,046    Lymphs Abs 08/06/2022 1,238    Absolute Monocytes 08/06/2022 488    Eosinophils Absolute 08/06/2022 258    Basophils Absolute 08/06/2022 70    Neutrophils Relative % 08/06/2022 49.9    Total Lymphocyte 08/06/2022 30.2    Monocytes Relative 08/06/2022 11.9    Eosinophils Relative 08/06/2022 6.3    Basophils Relative 08/06/2022 1.7    Glucose, Bld 08/06/2022 101 (H)    BUN 08/06/2022 21    Creat 08/06/2022 1.24    eGFR 08/06/2022 60    BUN/Creatinine Ratio 08/06/2022 SEE NOTE:    Sodium 08/06/2022 140    Potassium 08/06/2022 4.4    Chloride 08/06/2022 103    CO2 08/06/2022 28    Calcium 08/06/2022 9.2    Total Protein 08/06/2022 6.9    Albumin 08/06/2022 4.3    Globulin 08/06/2022 2.6    AG Ratio 08/06/2022 1.7    Total Bilirubin 08/06/2022 2.0 (H)    Alkaline phosphatase (AP* 08/06/2022 58    AST 08/06/2022 26    ALT 08/06/2022 19  Magnesium 08/06/2022 2.0    Cholesterol 08/06/2022 161    HDL 08/06/2022 80    Triglycerides 08/06/2022 52    LDL Cholesterol (Calc) 08/06/2022 68    Total CHOL/HDL Ratio 08/06/2022 2.0    Non-HDL Cholesterol (Cal* 08/06/2022 81    TSH 08/06/2022 1.24    Hgb A1c MFr Bld 08/06/2022 5.4    Mean Plasma Glucose 08/06/2022 108    eAG (mmol/L) 08/06/2022 6.0    Insulin 08/06/2022 6.7    Vit D, 25-Hydroxy 08/06/2022 69    Color, Urine 08/06/2022 YELLOW    APPearance 08/06/2022 CLEAR    Specific Gravity, Urine 08/06/2022 1.010    pH 08/06/2022 6.0    Glucose, UA 08/06/2022 NEGATIVE    Bilirubin Urine 08/06/2022 NEGATIVE    Ketones, ur  08/06/2022 NEGATIVE    Hgb urine dipstick 08/06/2022 NEGATIVE    Protein, ur 08/06/2022 NEGATIVE    Nitrite 08/06/2022 NEGATIVE    Leukocytes,Ua 08/06/2022 NEGATIVE    Creatinine, Urine 08/06/2022 71    Microalb, Ur 08/06/2022 3.1    Microalb Creat Ratio 08/06/2022 44 (H)    PSA 08/06/2022 <0.04   Telephone on 06/03/2022  Component Date Value   Glucose 06/04/2022 100 (H)    BUN 06/04/2022 18    Creatinine, Ser 06/04/2022 1.15    eGFR 06/04/2022 66    BUN/Creatinine Ratio 06/04/2022 16    Sodium 06/04/2022 138    Potassium 06/04/2022 4.0    Chloride 06/04/2022 100    CO2 06/04/2022 25    Calcium 06/04/2022 9.5    No image results found.   No results found.  No results found.      Assessment & Plan History of prostatectomy History of prostate cancer with prostatectomy performed. PSA levels are monitored annually with no current evidence of recurrence. Continue annual PSA testing. B12 deficiency  Coronary artery disease involving native coronary artery of native heart without angina pectoris Couldn't find evidence of this. Previously diagnosed with CAD, but recent heart catheterization showed no significant coronary artery disease. The low cholesterol goal may relate to this diagnosis or the history of subarachnoid hemorrhage. Likely has genetic protection from cholesterol-induced heart disease, as indicated by high HDL levels. Review cholesterol management with a cardiologist. On continuous oral anticoagulation  Dyslipidemia  Paroxysmal atrial fibrillation (HCC) Chronic AFib with episodes of atrial flutter occurs 10-20% of the time. Managed with Eliquis, which is well-tolerated despite a history of subarachnoid hemorrhage. The risk of ischemic stroke outweighs the risk of hemorrhagic stroke, justifying anticoagulation. Ablation is recommended due to technological advancements, potentially reducing stroke risk and eliminating the need for anticoagulation. Cryoablation is  preferred for its shorter procedure time and safety, though pulse field ablation with advanced mapping systems is also an option. Continue Eliquis for anticoagulation. Discuss ablation options with a cardiologist, focusing on cryoablation or pulse field ablation. Glaucoma suspect, bilateral  Thoracic aortic aneurysm without rupture, unspecified part (HCC)  Essential (primary) hypertension Hypertension is well-controlled with the current medication regimen. Blood pressure readings are consistently normal. Past hypotension episodes linked to dehydration and illness may have contributed to acute kidney injury in 2019. Continue current antihypertensive medications. Monitor blood pressure at home monthly. Hold antihypertensive medications during illness to prevent hypotension. Macrocytosis  History of subarachnoid hemorrhage Previous subarachnoid hemorrhage likely related to hypertension, with no current symptoms or recurrence. This history is considered in managing anticoagulation therapy for AFib  I have personally spent 32 minutes involved in face-to-face and non-face-to-face activities for this patient on  the day of the visit. Professional time spent includes the following activities: Preparing to see the patient (review of tests), Obtaining and/or reviewing separately obtained history (admission/discharge record), Performing a medically appropriate examination and/or evaluation , Ordering medications/tests/procedures, referring and communicating with other health care professionals, Documenting clinical information in the EMR, Independently interpreting results (not separately reported), Communicating results to the patient/family/caregiver, Counseling and educating the patient/family/caregiver and Care coordination (not separately reported).        Additional notes: This document was synthesized by artificial intelligence (Abridge) using HIPAA-compliant recording of the clinical interaction;   We  discussed the use of AI scribe software for clinical note transcription with the patient, who gave verbal consent to proceed.    Additional Info: This encounter employed state-of-the-art, real-time, collaborative documentation. The patient actively reviewed and assisted in updating their electronic medical record on a shared screen, ensuring transparency and facilitating joint problem-solving for the problem list, overview, and plan. This approach promotes accurate, informed care. The treatment plan was discussed and reviewed in detail, including medication safety, potential side effects, and all patient questions. We confirmed understanding and comfort with the plan. Follow-up instructions were established, including contacting the office for any concerns, returning if symptoms worsen, persist, or new symptoms develop, and precautions for potential emergency department visits.  Initial Appointment Goals:  This initial visit focused on establishing a foundation for the patient's care. We collaboratively reviewed his medical history and medications in detail, updating the chart as shown in the encounter. Given the extensive information, we prioritized addressing his most pressing concerns, which he reported were: new pt (Pt is present for est care with pcp. Just had CPE with Dr. Oneta Rack in Jan.)  While the complexity of the patient's medical picture may necessitate further evaluation in subsequent visits, we were able to develop a preliminary care plan together. To expedite a comprehensive plan at the next visit, we encouraged the patient to gather relevant medical records from previous providers. This collaborative approach will ensure a more complete understanding of the patient's health and inform the development of a personalized care plan. We look forward to continuing the conversation and working together with the patient on achieving his health goals.   Collaborative Documentation:  Today's encounter  utilized real-time, dynamic patient engagement.  Patients actively participate by directly reviewing and assisting in updating their medical records through a shared screen. This transparency empowers patients to visually confirm chart updates made by the healthcare provider.  This collaborative approach facilitates problem management as we jointly update the problem list, problem overview, and assessment/plan. Ultimately, this process enhances chart accuracy and completeness, fostering shared decision-making, patient education, and informed consent for tests and treatments.  Collaborative Treatment Planning:  Treatment plans were discussed and reviewed in detail.  Explained medication safety and potential side effects.  Encouraged participation and answered all patient questions, confirming understanding and comfort with the plan. Encouraged patient to contact our office if they have any questions or concerns. Agreed on patient returning to office if symptoms worsen, persist, or new symptoms develop.  ----------------------------------------------------- Lula Olszewski, MD  05/24/2023 7:50 PM  [facility] [place]:  [phone]

## 2023-05-24 NOTE — Assessment & Plan Note (Signed)
 Hypertension is well-controlled with the current medication regimen. Blood pressure readings are consistently normal. Past hypotension episodes linked to dehydration and illness may have contributed to acute kidney injury in 2019. Continue current antihypertensive medications. Monitor blood pressure at home monthly. Hold antihypertensive medications during illness to prevent hypotension.

## 2023-05-28 ENCOUNTER — Other Ambulatory Visit: Payer: Self-pay | Admitting: *Deleted

## 2023-05-28 MED ORDER — AMLODIPINE BESYLATE 5 MG PO TABS: 5.0000 mg | ORAL_TABLET | Freq: Every day | ORAL | 2 refills | Status: DC

## 2023-06-16 ENCOUNTER — Ambulatory Visit
Admission: RE | Admit: 2023-06-16 | Discharge: 2023-06-16 | Disposition: A | Discharge status: Home / Self Care | Payer: Medicare Other | Source: Ambulatory Visit | Attending: Cardiology | Admitting: Cardiology

## 2023-06-16 DIAGNOSIS — I712 Thoracic aortic aneurysm, without rupture, unspecified: Secondary | ICD-10-CM

## 2023-06-16 DIAGNOSIS — I700 Atherosclerosis of aorta: Secondary | ICD-10-CM | POA: Diagnosis not present

## 2023-06-16 DIAGNOSIS — K8020 Calculus of gallbladder without cholecystitis without obstruction: Secondary | ICD-10-CM | POA: Diagnosis not present

## 2023-06-16 DIAGNOSIS — I2510 Atherosclerotic heart disease of native coronary artery without angina pectoris: Secondary | ICD-10-CM | POA: Diagnosis not present

## 2023-06-16 DIAGNOSIS — I7121 Aneurysm of the ascending aorta, without rupture: Secondary | ICD-10-CM | POA: Diagnosis not present

## 2023-06-16 MED ORDER — IOPAMIDOL (ISOVUE-370) INJECTION 76%
500.0000 mL | Freq: Once | INTRAVENOUS | Status: AC | PRN
  Administered 2023-06-16: 75 mL via INTRAVENOUS

## 2023-06-21 ENCOUNTER — Encounter: Payer: Self-pay | Admitting: *Deleted

## 2023-06-22 ENCOUNTER — Other Ambulatory Visit: Payer: Self-pay | Admitting: *Deleted

## 2023-06-22 DIAGNOSIS — I712 Thoracic aortic aneurysm, without rupture, unspecified: Secondary | ICD-10-CM

## 2023-06-28 DIAGNOSIS — H40003 Preglaucoma, unspecified, bilateral: Secondary | ICD-10-CM | POA: Diagnosis not present

## 2023-06-28 DIAGNOSIS — H2513 Age-related nuclear cataract, bilateral: Secondary | ICD-10-CM | POA: Diagnosis not present

## 2023-07-15 NOTE — Progress Notes (Signed)
 HPI: FU atrial fibrillation and coronary artery disease. Patient previously followed by Dr. Emi Hanson in [facility]. Cardiac catheterization 2018 showed normal coronary arteries. CTA April 2021 showed no pulmonary embolus, dilated aortic root at 4.2 cm, nonobstructive coronary disease other than severe stenosis in ramus intermedius. Nuclear study May 2021 showed ejection fraction 51% and no ischemia or infarction. Patient previously treated with flecainide  for atrial fibrillation but this was discontinued.  He has therefore been treated with rate control and anticoagulation. Last echocardiogram September 2021 showed ejection fraction 60 to 65%, mild left ventricular hypertrophy, severe left atrial enlargement, moderate right atrial enlargement, mild AI.  CTA 4/25 showed 4.4 cm ascending thoracic aortic aneurysm and coronary calcification.  Since last seen, the patient denies any dyspnea on exertion, orthopnea, PND, pedal edema, palpitations, syncope or chest pain. No bleeding.   Current Outpatient Medications  Medication Sig Dispense Refill   amLODipine  (NORVASC ) 5 MG tablet Take 1 tablet (5 mg total) by mouth daily. 90 tablet 2   apixaban  (ELIQUIS ) 5 MG TABS tablet Take 1 tablet (5 mg total) by mouth 2 (two) times daily. 180 tablet 1   atenolol  (TENORMIN ) 25 MG tablet TAKE 1 TABLET BY MOUTH DAILY 90 tablet 2   Cholecalciferol (VITAMIN D3) 5000 UNITS CAPS Take 5,000 Int'l Units by mouth daily.     diphenhydrAMINE  (BENADRYL ) 25 mg capsule Take 25 mg by mouth at bedtime as needed for sleep.     ezetimibe  (ZETIA ) 10 MG tablet TAKE 1 TABLET BY MOUTH DAILY FOR CHOLESTEROL 90 tablet 3   Glucos-Chond-Hyal Ac-Ca Fructo (MOVE FREE JOINT HEALTH ADVANCE) TABS Take 1 tablet by mouth every morning.     hydrochlorothiazide  (HYDRODIURIL ) 25 MG tablet TAKE 1 TABLET BY MOUTH DAILY 90 tablet 2   ibuprofen (ADVIL) 200 MG tablet Take by mouth.     meclizine  (ANTIVERT ) 25 MG tablet Take 1/2 to 1 tablet 2 to  3 x /day as needed for  "Sea Sickness", Dizziness / Vertigo 90 tablet 0   olmesartan  (BENICAR ) 40 MG tablet Take  1 tablet  at Bedtime for BP 90 tablet 3   simvastatin  (ZOCOR ) 40 MG tablet TAKE 1 TABLET BY MOUTH AT BEDTIME FOR CHOLESTEROL 90 tablet 3   valACYclovir  (VALTREX ) 500 MG tablet Take  1 tablet  Daily  to Prevent Fever Blisters                                       /                                TAKE                               BY                      MOUTH 90 tablet 3   zinc gluconate 50 MG tablet Take 50 mg by mouth daily.     No current facility-administered medications for this visit.     Past Medical History:  Diagnosis Date   Anxiety    Atrial fibrillation (HCC)    B12 deficiency 09/05/2020   Lab Results      Component    Value    Date/Time  VITAMINB12    1,771 (H)    08/05/2021 11:07 AM           VITAMINB12    380    08/29/2013 05:47 PM           CAD in native artery 06/28/2019   Ct evidence of circumflex narrowing     Coronary artery disease    Gilbert's syndrome 08/06/2021   Hyperlipidemia    Hypertension    ICH (intracerebral hemorrhage) (HCC)    Mass of foot 07/08/2022   Nuclear sclerosis 03/17/2013   Other testicular hypofunction    Permanent atrial fibrillation (HCC)    Personal history of other diseases of the circulatory system 07/27/2016   Primary malignant neoplasm of prostate (HCC) 03/13/2013   Prostate cancer (HCC)    Testosterone  deficiency 03/22/2017   Vitamin D  deficiency     Past Surgical History:  Procedure Laterality Date   COLONOSCOPY     EXCISION MASS LOWER EXTREMETIES Left 10/01/2022   Procedure: Exisional biospy of left dorsal foot mass;  Surgeon: Amada Backer, MD;  Location:  [facility];  Service: Orthopedics;  Laterality: Left;   LYMPHADENECTOMY Bilateral 06/23/2012   Procedure: LYMPHADENECTOMY;  Surgeon: Kristeen Peto, MD;  Location: [facility];  Service: Urology;  Laterality: Bilateral;   ROBOT ASSISTED LAPAROSCOPIC  RADICAL PROSTATECTOMY N/A 06/23/2012   Procedure: ROBOTIC ASSISTED LAPAROSCOPIC RADICAL PROSTATECTOMY LEVEL 2;  Surgeon: Kristeen Peto, MD;  Location: [facility];  Service: Urology;  Laterality: N/A;    Social History   Socioeconomic History   Marital status: Married    Spouse name: Not on file   Number of children: 3   Years of education: Not on file   Highest education level: Not on file  Occupational History    Comment: Sales  Tobacco Use   Smoking status: Never   Smokeless tobacco: Never  Substance and Sexual Activity   Alcohol use: Yes    Alcohol/week: 5.0 standard drinks of alcohol    Types: 5 Cans of beer per week    Comment: 2-3 beers per day   Drug use: No   Sexual activity: Yes  Other Topics Concern   Not on file  Social History Narrative   Not on file   Social Drivers of Health   Financial Resource Strain: Not on file  Food Insecurity: Not on file  Transportation Needs: Not on file  Physical Activity: Not on file  Stress: Not on file  Social Connections: Not on file  Intimate Partner Violence: Not on file    Family History  Problem Relation Age of Onset   Colon cancer Father    Esophageal cancer Neg Hx    Rectal cancer Neg Hx    Stomach cancer Neg Hx     ROS: no fevers or chills, productive cough, hemoptysis, dysphasia, odynophagia, melena, hematochezia, dysuria, hematuria, rash, seizure activity, orthopnea, PND, pedal edema, claudication. Remaining systems are negative.  Physical Exam: Well-developed well-nourished in no acute distress.  Skin is warm and dry.  HEENT is normal.  Neck is supple.  Chest is clear to auscultation with normal expansion.  Cardiovascular exam is irregular Abdominal exam nontender or distended. No masses palpated. Extremities show no edema. neuro grossly intact  EKG Interpretation Date/Time:  Thursday July 29 2023 09:47:18 EDT Ventricular Rate:  60 PR Interval:    QRS Duration:  94 QT Interval:  448 QTC Calculation: 448 R  Axis:   44  Text Interpretation: Atrial fibrillation Nonspecific ST and T wave  abnormality Confirmed by Alexandria Angel ([id number]) on 07/29/2023 9:49:48 AM    A/P  1 permanent atrial fibrillation-continue atenolol  and apixaban  at present dose.  2 thoracic aortic aneurysm-unchanged on most recent CTA.  Plan follow-up study April 2026.  3 hypertension-blood pressure controlled.  Continue present medications.  4 hyperlipidemia-patient previously did not tolerate Lipitor.  Will continue Zocor  and Zetia  at present dose.  5 coronary artery disease-no chest pain.  Continue statin.  6 aortic insufficiency-we will plan repeat echocardiogram.  Alexandria Angel, MD

## 2023-07-29 ENCOUNTER — Other Ambulatory Visit: Payer: Self-pay

## 2023-07-29 ENCOUNTER — Encounter: Payer: Self-pay | Admitting: Cardiology

## 2023-07-29 ENCOUNTER — Ambulatory Visit: Payer: Medicare Other | Attending: Cardiology | Admitting: Cardiology

## 2023-07-29 VITALS — BP 110/76 | HR 60 | Ht 78.0 in | Wt 226.4 lb

## 2023-07-29 DIAGNOSIS — I712 Thoracic aortic aneurysm, without rupture, unspecified: Secondary | ICD-10-CM | POA: Insufficient documentation

## 2023-07-29 DIAGNOSIS — E7800 Pure hypercholesterolemia, unspecified: Secondary | ICD-10-CM | POA: Insufficient documentation

## 2023-07-29 DIAGNOSIS — I10 Essential (primary) hypertension: Secondary | ICD-10-CM | POA: Diagnosis not present

## 2023-07-29 DIAGNOSIS — I2510 Atherosclerotic heart disease of native coronary artery without angina pectoris: Secondary | ICD-10-CM

## 2023-07-29 DIAGNOSIS — I4821 Permanent atrial fibrillation: Secondary | ICD-10-CM

## 2023-07-29 NOTE — Patient Instructions (Addendum)
 Medication Instructions:  Continue same medications *If you need a refill on your cardiac medications before your next appointment, please call your pharmacy*  Lab Work: Have Bmet done 1 week before chest ct  Testing/Procedures: Schedule Chest CT  05/2024  Follow-Up: At [facility], you and your health needs are our priority.  As part of our continuing mission to provide you with exceptional heart care, our providers are all part of one team.  This team includes your primary Cardiologist (physician) and Advanced Practice Providers or APPs (Physician Assistants and Nurse Practitioners) who all work together to provide you with the care you need, when you need it.  Your next appointment:  1 year   Call in Feb to schedule June appointment     Provider:  Lilian Register   We recommend signing up for the patient portal called "MyChart".  Sign up information is provided on this After Visit Summary.  MyChart is used to connect with patients for Virtual Visits (Telemedicine).  Patients are able to view lab/test results, encounter notes, upcoming appointments, etc.  Non-urgent messages can be sent to your provider as well.   To learn more about what you can do with MyChart, go to [email or web address].

## 2023-08-04 ENCOUNTER — Other Ambulatory Visit: Payer: Self-pay | Admitting: Cardiology

## 2023-08-06 ENCOUNTER — Other Ambulatory Visit: Payer: Self-pay

## 2023-08-06 ENCOUNTER — Encounter: Payer: Medicare Other | Admitting: Nurse Practitioner

## 2023-08-06 MED ORDER — HYDROCHLOROTHIAZIDE 25 MG PO TABS: 25.0000 mg | ORAL_TABLET | Freq: Every day | ORAL | 3 refills | Status: DC

## 2023-08-24 ENCOUNTER — Other Ambulatory Visit: Payer: Self-pay

## 2023-08-24 MED ORDER — SIMVASTATIN 40 MG PO TABS: ORAL_TABLET | ORAL | 3 refills | Status: AC

## 2023-09-01 DIAGNOSIS — H2513 Age-related nuclear cataract, bilateral: Secondary | ICD-10-CM | POA: Diagnosis not present

## 2023-09-01 DIAGNOSIS — H40003 Preglaucoma, unspecified, bilateral: Secondary | ICD-10-CM | POA: Diagnosis not present

## 2023-10-01 IMAGING — CT CT ANGIO CHEST
2 of 6 series · 17 of 46 positions shown · IV contrast (APPLIED)
Comparison: May 27, 2020.

CLINICAL DATA: Thoracic aortic aneurysm

EXAM:
CT ANGIOGRAPHY CHEST WITH CONTRAST
TECHNIQUE: Multidetector CT imaging of the chest was performed using the
standard protocol during bolus administration of intravenous
contrast. Multiplanar CT image reconstructions and MIPs were
obtained to evaluate the vascular anatomy.

[Series 7: arterial · axial · arterial · 0.80mm/px · z∈[+1280,+1568]mm · 14 of 168 slices shown]
[im 12/168  lung]
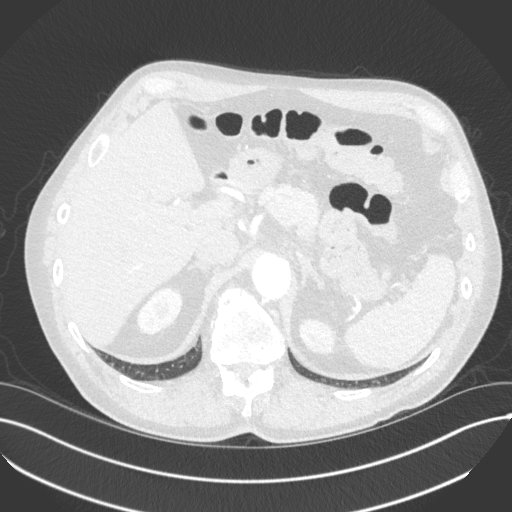
[im 23/168  soft-tissue]
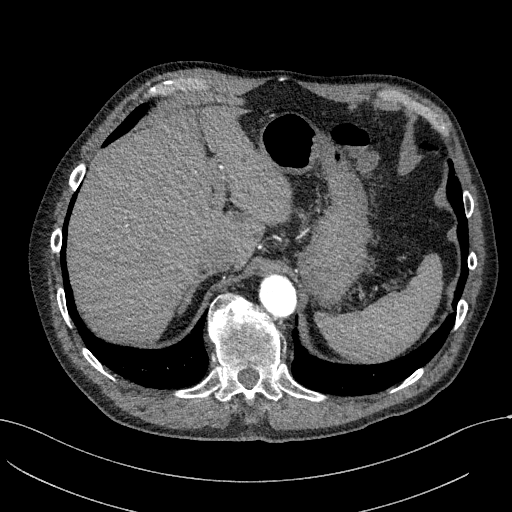
[im 34/168  lung]
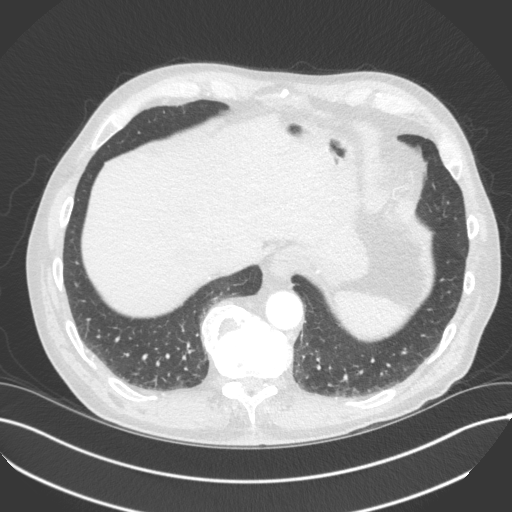
[im 45/168  soft-tissue]
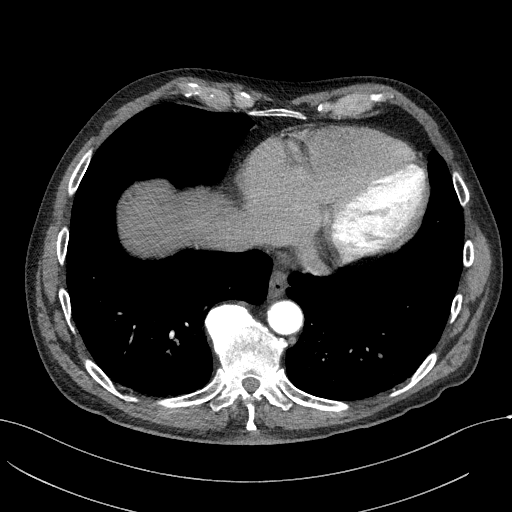
[im 56/168  lung]
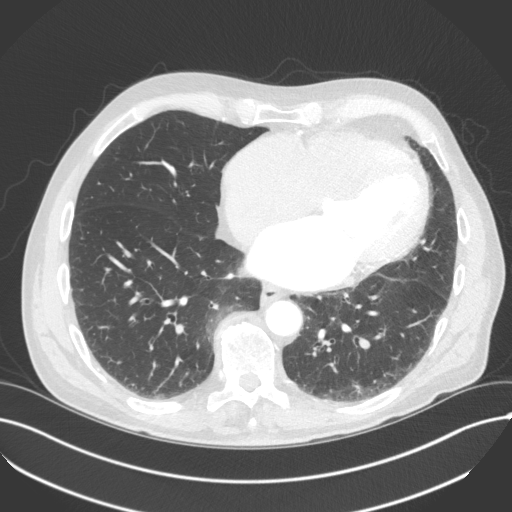
[im 67/168  soft-tissue]
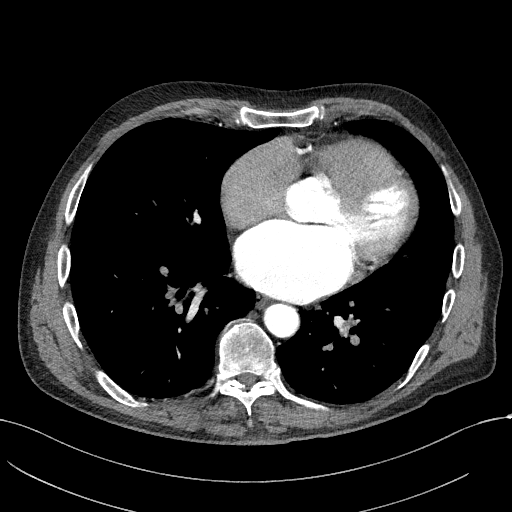
[im 78/168  lung]
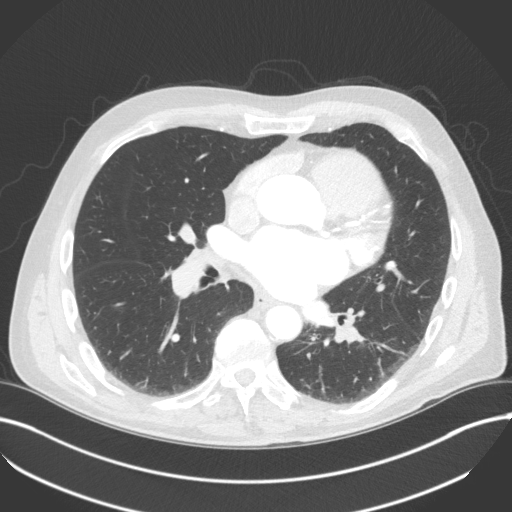
[im 90/168  soft-tissue]
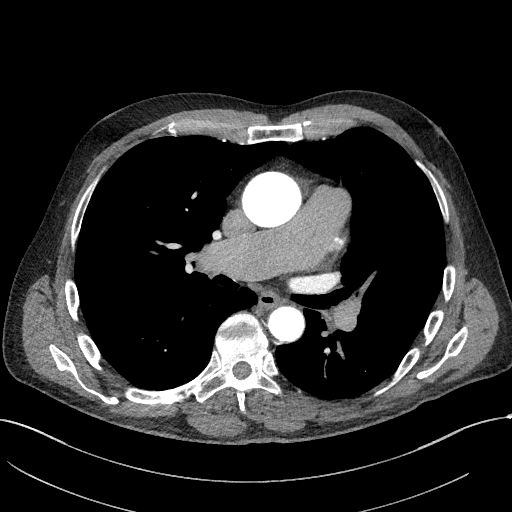
[im 101/168  lung]
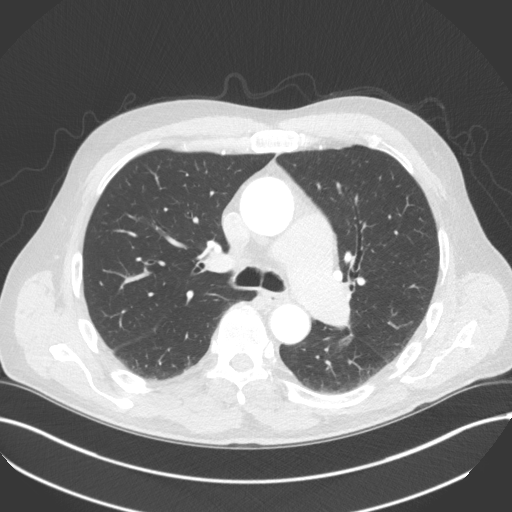
[im 112/168  soft-tissue]
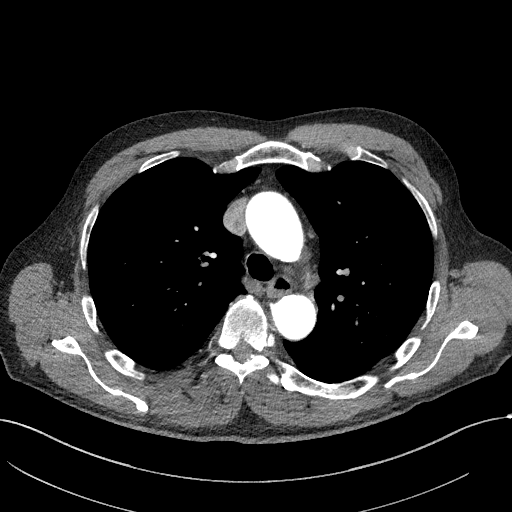
[im 123/168  lung]
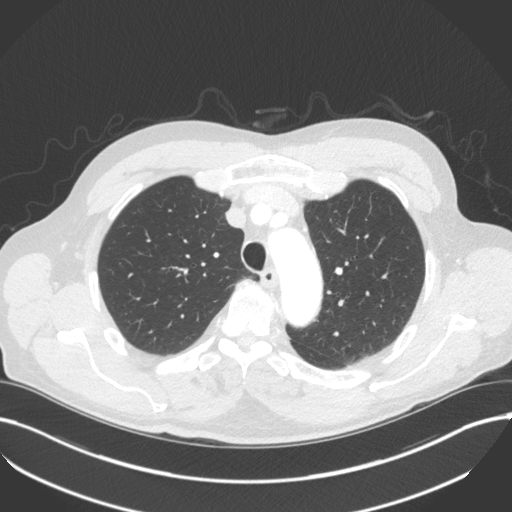
[im 134/168  soft-tissue]
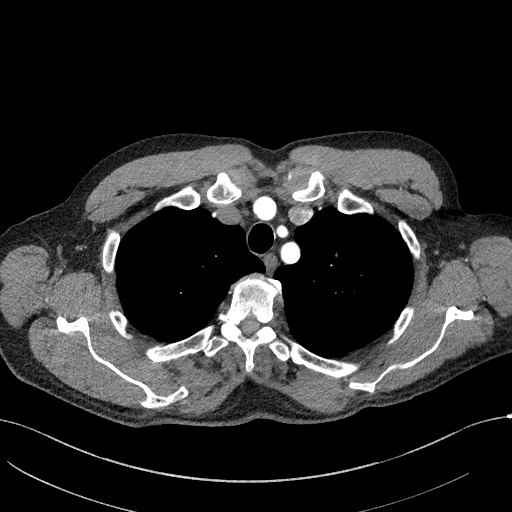
[im 145/168  lung]
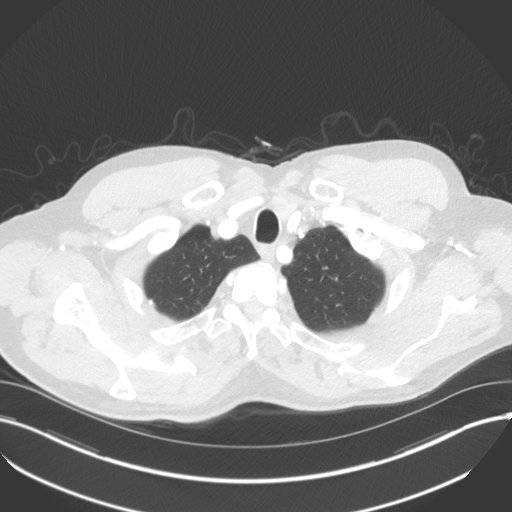
[im 156/168  soft-tissue]
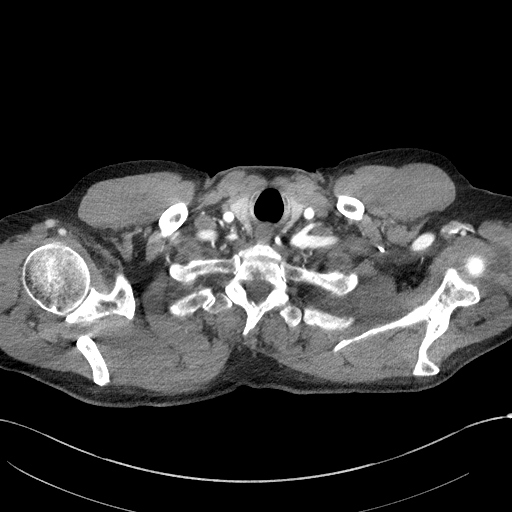

[Series 10: cor soft · coronal · 0.66mm/px · 3 of 158 slices shown]
[im 40/158  soft-tissue]
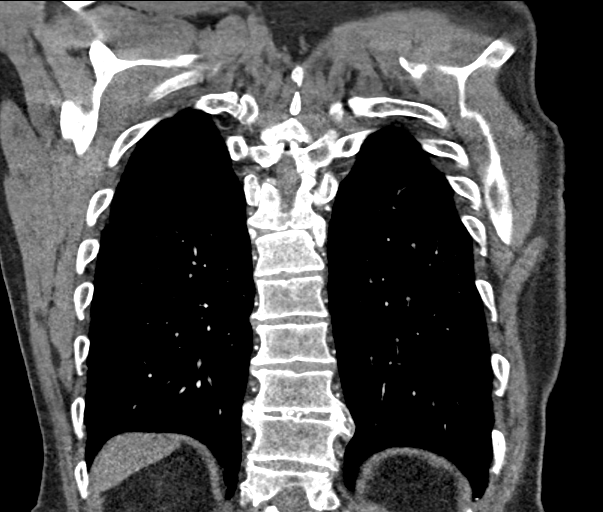
[im 79/158  soft-tissue]
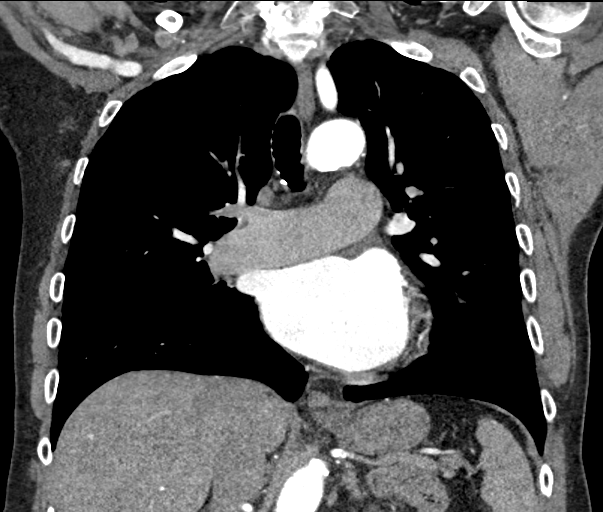
[im 118/158  soft-tissue]
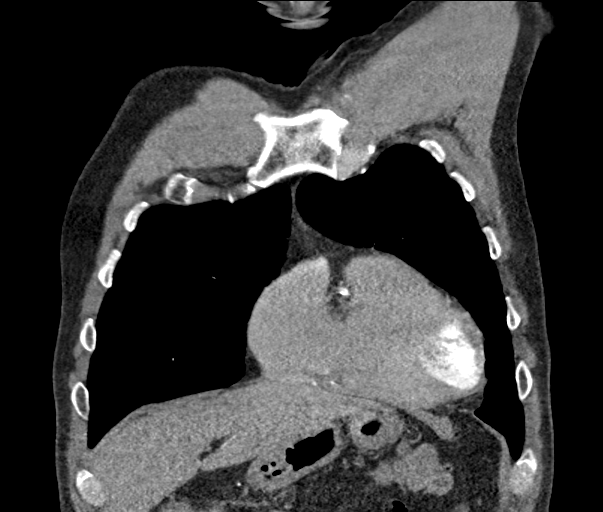

[17 of 46 positions shown; findings below may reference images not displayed]

RADIATION DOSE REDUCTION: This exam was performed according to the
departmental dose-optimization program which includes automated
exposure control, adjustment of the mA and/or kV according to
patient size and/or use of iterative reconstruction technique.

CONTRAST:  65mL OMNIPAQUE IOHEXOL 350 MG/ML SOLN
FINDINGS: Cardiovascular: Grossly stable 4.4 cm ascending thoracic aortic
aneurysm is noted. Atherosclerosis of thoracic aorta is noted
without dissection. Great vessels are widely patent without
significant stenosis. Transverse aortic arch measures 3.3 cm.
Proximal descending thoracic aorta measures 3.4 cm. Normal cardiac
size. No pericardial effusion.

Mediastinum/Nodes: No enlarged mediastinal, hilar, or axillary lymph
nodes. Thyroid gland, trachea, and esophagus demonstrate no
significant findings.

Lungs/Pleura: No pneumothorax or pleural effusion is noted. Stable
small calcified granuloma seen in left upper lobe. 4 mm nodule is
noted in left upper lobe best seen on image number 35 of series 9.
This may be slightly enlarged compared to prior exam. Also noted is
stable 10 mm nodule seen in right lower lobe best seen on image
number 110 of series 9.

Upper Abdomen: No acute abnormality.

Musculoskeletal: No chest wall abnormality. No acute or significant
osseous findings.

Review of the MIP images confirms the above findings.
IMPRESSION: Grossly stable 4.4 cm ascending thoracic aortic aneurysm is noted.
Recommend annual imaging followup by CTA or MRA. This recommendation
follows 3454 ACCF/AHA/AATS/ACR/ASA/SCA/DEMBROVSZKI/TYNESHA/MOOLMAN/OMO Guidelines
for the Diagnosis and Management of Patients with Thoracic Aortic
Disease. Circulation. 3454; 121: [id number]. Aortic aneurysm NOS
(EKVAE-IHO.N).

Stable 4 mm nodule is noted in left upper lobe with stable 10 mm
nodule seen in right lower lobe. Attention to these abnormalities on
follow-up imaging is recommended.

Aortic Atherosclerosis (EKVAE-96Z.Z).

## 2023-10-28 ENCOUNTER — Other Ambulatory Visit: Payer: Self-pay | Admitting: *Deleted

## 2023-10-28 DIAGNOSIS — I480 Paroxysmal atrial fibrillation: Secondary | ICD-10-CM

## 2023-10-28 MED ORDER — APIXABAN 5 MG PO TABS: 5.0000 mg | ORAL_TABLET | Freq: Two times a day (BID) | ORAL | 1 refills | Status: AC

## 2023-10-28 NOTE — Telephone Encounter (Signed)
 Prescription refill request for Eliquis  received.  Indication: afib  Last office visit: 07/29/2023, crenshaw Scr: 1.16, 03/11/2023 Age: 78 yo  Weight: 102.7 kg   Refill sent.

## 2023-11-16 ENCOUNTER — Ambulatory Visit: Payer: Medicare Other | Admitting: Nurse Practitioner

## 2023-11-24 ENCOUNTER — Encounter: Payer: Self-pay | Admitting: Internal Medicine

## 2023-11-24 ENCOUNTER — Ambulatory Visit: Payer: Medicare Other | Admitting: Nurse Practitioner

## 2023-11-24 ENCOUNTER — Ambulatory Visit (INDEPENDENT_AMBULATORY_CARE_PROVIDER_SITE_OTHER): Admitting: Internal Medicine

## 2023-11-24 VITALS — BP 100/64 | HR 70 | Temp 98.0°F | Ht 78.0 in | Wt 217.4 lb

## 2023-11-24 DIAGNOSIS — I712 Thoracic aortic aneurysm, without rupture, unspecified: Secondary | ICD-10-CM | POA: Diagnosis not present

## 2023-11-24 DIAGNOSIS — I480 Paroxysmal atrial fibrillation: Secondary | ICD-10-CM

## 2023-11-24 DIAGNOSIS — I771 Stricture of artery: Secondary | ICD-10-CM

## 2023-11-24 DIAGNOSIS — D7589 Other specified diseases of blood and blood-forming organs: Secondary | ICD-10-CM | POA: Diagnosis not present

## 2023-11-24 DIAGNOSIS — Z6827 Body mass index (BMI) 27.0-27.9, adult: Secondary | ICD-10-CM | POA: Diagnosis not present

## 2023-11-24 DIAGNOSIS — I10 Essential (primary) hypertension: Secondary | ICD-10-CM | POA: Diagnosis not present

## 2023-11-24 DIAGNOSIS — Z8679 Personal history of other diseases of the circulatory system: Secondary | ICD-10-CM | POA: Diagnosis not present

## 2023-11-24 DIAGNOSIS — I2510 Atherosclerotic heart disease of native coronary artery without angina pectoris: Secondary | ICD-10-CM

## 2023-11-24 DIAGNOSIS — N182 Chronic kidney disease, stage 2 (mild): Secondary | ICD-10-CM

## 2023-11-24 DIAGNOSIS — E559 Vitamin D deficiency, unspecified: Secondary | ICD-10-CM | POA: Diagnosis not present

## 2023-11-24 DIAGNOSIS — E785 Hyperlipidemia, unspecified: Secondary | ICD-10-CM

## 2023-11-24 NOTE — Progress Notes (Signed)
 ==============================  [facility]: [phone]   -- Medical Office Visit --  Patient: Kenneth Russell      Age: 78 y.o.       Sex:  male  Date:   11/24/2023 Today's Healthcare Provider: Bernardino KANDICE Cone, MD  ==============================   Chief Complaint: Hypertension and Hyperlipidemia   Discussed the use of AI scribe software for clinical note transcription with the patient, who gave verbal consent to proceed.  History of Present Illness 78 year old male who presents for a routine check-up.  He has a history of atrial fibrillation, which is currently under control and monitored by his watch. He last consulted a cardiologist in June, who recommended a CT scan of the chest in April next year. An echocardiogram in 2021 showed a slightly enlarged left ventricle and left atrium.  He is on simvastatin  40 mg and has a history of subarachnoid hemorrhage. He takes atenolol  and hydrochlorothiazide  for blood pressure management, with well-controlled readings around 118/85 since retirement. Weight loss has positively impacted his blood pressure.  He experiences occasional dizziness, particularly when standing up, which he attributes to standing up too fast. He consumes alcohol daily, including three beers and some wine, which may contribute to a vitamin deficiency. He takes 5000 units of vitamin D .  His past medical history includes macrocytosis, with large red blood cells noted in past blood work. This condition has been present for decades without recent workup. He also has a history of high bilirubin levels, which have been elevated throughout his life.  Wt Readings from Last 50 Encounters:  11/24/23 217 lb 6.4 oz (98.6 kg)  07/29/23 226 lb 6.4 oz (102.7 kg)  05/24/23 235 lb 6.4 oz (106.8 kg)  03/11/23 243 lb 9.6 oz (110.5 kg)  11/24/22 236 lb 3.2 oz (107.1 kg)  10/01/22 231 lb 7.7 oz (105 kg)  08/06/22 231 lb 12.8 oz (105.1 kg)  07/10/22  232 lb 12.8 oz (105.6 kg)  11/13/21 237 lb (107.5 kg)  08/05/21 235 lb (106.6 kg)  05/16/21 241 lb (109.3 kg)  01/21/21 233 lb 3.2 oz (105.8 kg)  01/14/21 231 lb 12.8 oz (105.1 kg)  01/08/21 234 lb (106.1 kg)  12/27/20 236 lb (107 kg)  09/05/20 230 lb (104.3 kg)  07/18/20 232 lb 9.6 oz (105.5 kg)  05/14/20 230 lb (104.3 kg)  04/09/20 235 lb (106.6 kg)  12/12/19 228 lb 9.6 oz (103.7 kg)  10/26/19 229 lb 12.8 oz (104.2 kg)  08/14/19 231 lb (104.8 kg)  06/14/19 232 lb (105.2 kg)  05/11/19 235 lb 3.2 oz (106.7 kg)  07/29/18 230 lb (104.3 kg)  04/13/18 241 lb (109.3 kg)  10/05/17 229 lb 9.6 oz (104.1 kg)  02/11/17 238 lb 6.4 oz (108.1 kg)  03/30/16 235 lb (106.6 kg)  02/12/16 230 lb (104.3 kg)  09/12/15 227 lb (103 kg)  05/28/15 232 lb (105.2 kg)  01/31/15 240 lb 12.8 oz (109.2 kg)  08/06/14 237 lb (107.5 kg)  04/24/14 239 lb 3.2 oz (108.5 kg)  01/03/14 238 lb (108 kg)  08/29/13 239 lb 3.2 oz (108.5 kg)  03/13/13 249 lb 9.6 oz (113.2 kg)  06/23/12 240 lb 15.4 oz (109.3 kg)  06/07/12 221 lb 12.8 oz (100.6 kg)  03/17/12 247 lb (112 kg)  03/04/12 247 lb (112 kg)   BMI Readings from Last 50 Encounters:  11/24/23 25.12 kg/m  07/29/23 26.16 kg/m  05/24/23 27.91 kg/m  03/11/23 28.89 kg/m  11/24/22  28.01 kg/m  10/01/22 27.45 kg/m  08/06/22 27.85 kg/m  07/10/22 26.23 kg/m  11/13/21 27.74 kg/m  08/05/21 27.51 kg/m  05/16/21 27.15 kg/m  01/21/21 27.65 kg/m  01/14/21 27.49 kg/m  01/08/21 27.75 kg/m  12/27/20 27.99 kg/m  09/05/20 27.27 kg/m  07/18/20 27.58 kg/m  05/14/20 26.58 kg/m  04/09/20 26.81 kg/m  12/12/19 26.08 kg/m  10/26/19 26.56 kg/m  08/14/19 26.52 kg/m  06/14/19 26.64 kg/m  05/11/19 27.01 kg/m  07/29/18 26.41 kg/m  04/13/18 27.67 kg/m  10/05/17 26.20 kg/m  02/11/17 27.20 kg/m  03/30/16 26.81 kg/m  02/12/16 26.24 kg/m  10/10/15 25.90 kg/m  09/12/15 25.90 kg/m  05/28/15 26.47 kg/m  01/31/15 27.47 kg/m  08/06/14 27.39  kg/m  04/24/14 27.64 kg/m  01/03/14 27.50 kg/m  08/29/13 27.64 kg/m  03/13/13 28.12 kg/m  06/23/12 27.15 kg/m  06/07/12 24.99 kg/m  03/17/12 27.83 kg/m  03/04/12 27.83 kg/m   Lab Results  Component Value Date   MCV 100.9 (H) 03/11/2023   MCV 99.5 08/06/2022   MCV 100.8 (H) 11/13/2021   MCV 99.3 08/05/2021   MCV 101.4 (H) 12/27/2020   Per most recent structured social history update,  reports current alcohol use of about 5.0 standard drinks of alcohol per week. Lab Results  Component Value Date   HGB 14.3 03/11/2023   HGB 14.0 08/06/2022   HGB 13.7 11/13/2021   HGB 13.9 08/05/2021   HGB 14.4 12/27/2020   Lab Results  Component Value Date   VITAMINB12 1,771 (H) 08/05/2021   VITAMINB12 380 08/29/2013   No components found for: MMA No results found for: FOLATE Lab Results  Component Value Date   TSH 2.24 03/11/2023   TSH 1.24 08/06/2022   TSH 2.49 12/27/2020   No results found for: FREET4 No results found for: SPEP No results found for: UPEP    Latest Ref Rng & Units 03/11/2023   11:29 AM  Serum Protein Electrophoresis  Total Protein 6.1 - 8.1 g/dL 7.0    No results found for: PATHREVIEW  Background Reviewed: Problem List: has Essential (primary) hypertension; Vitamin D  deficiency, unspecified; History of prostate cancer; BMI 27.0-27.9,adult; Glaucoma suspect, bilateral; Paroxysmal atrial fibrillation (HCC); Chronic kidney disease (CKD), active medical management without dialysis, stage 2 (mild); Tortuous aorta; Thoracic aortic aneurysm without rupture; Coronary artery disease involving native coronary artery of native heart without angina pectoris; Age-related nuclear cataract, right eye; Dyslipidemia; History of subarachnoid hemorrhage; and On continuous oral anticoagulation on their problem list. Past Medical History:  has a past medical history of Anxiety, Atrial fibrillation (HCC), B12 deficiency (09/05/2020), CAD in native artery  (06/28/2019), Coronary artery disease, Gilbert's syndrome (08/06/2021), Hyperlipidemia, Hypertension, ICH (intracerebral hemorrhage) (HCC), Mass of foot (07/08/2022), Nuclear sclerosis (03/17/2013), Other testicular hypofunction, Permanent atrial fibrillation (HCC), Personal history of other diseases of the circulatory system (07/27/2016), Primary malignant neoplasm of prostate (HCC) (03/13/2013), Prostate cancer (HCC), Testosterone  deficiency (03/22/2017), and Vitamin D  deficiency. Past Surgical History:   has a past surgical history that includes Colonoscopy; Robot assisted laparoscopic radical prostatectomy (N/A, 06/23/2012); Lymphadenectomy (Bilateral, 06/23/2012); and Excision mass lower extremeties (Left, 10/01/2022). Social History:   reports that he has never smoked. He has never used smokeless tobacco. He reports current alcohol use of about 5.0 standard drinks of alcohol per week. He reports that he does not use drugs. Family History:  family history includes Colon cancer in his father. Allergies:  is allergic to ultram [tramadol], atorvastatin , and doxazosin .   Medication Reconciliation: Current Outpatient Medications on File Prior to  Visit  Medication Sig   amLODipine  (NORVASC ) 5 MG tablet Take 1 tablet (5 mg total) by mouth daily.   apixaban  (ELIQUIS ) 5 MG TABS tablet Take 1 tablet (5 mg total) by mouth 2 (two) times daily.   Cholecalciferol (VITAMIN D3) 5000 UNITS CAPS Take 5,000 Int'l Units by mouth daily.   diphenhydrAMINE  (BENADRYL ) 25 mg capsule Take 25 mg by mouth at bedtime as needed for sleep.   ezetimibe  (ZETIA ) 10 MG tablet TAKE 1 TABLET BY MOUTH DAILY FOR CHOLESTEROL   Glucos-Chond-Hyal Ac-Ca Fructo (MOVE FREE JOINT HEALTH ADVANCE) TABS Take 1 tablet by mouth every morning.   ibuprofen (ADVIL) 200 MG tablet Take by mouth.   meclizine  (ANTIVERT ) 25 MG tablet Take 1/2 to 1 tablet 2 to 3 x /day as needed for  [employer], Dizziness / Vertigo   olmesartan  (BENICAR ) 40 MG tablet Take   1 tablet  at Bedtime for BP   simvastatin  (ZOCOR ) 40 MG tablet TAKE 1 TABLET BY MOUTH AT BEDTIME FOR CHOLESTEROL   valACYclovir  (VALTREX ) 500 MG tablet Take  1 tablet  Daily  to Prevent Fever Blisters                                       /                                TAKE                               BY                      MOUTH   zinc gluconate 50 MG tablet Take 50 mg by mouth daily.   atenolol  (TENORMIN ) 25 MG tablet TAKE 1 TABLET BY MOUTH DAILY   No current facility-administered medications on file prior to visit.   Medications Discontinued During This Encounter  Medication Reason   hydrochlorothiazide  (HYDRODIURIL ) 25 MG tablet Side effect (s)     Physical Exam:    11/24/2023    2:22 PM 07/29/2023    9:42 AM 05/24/2023    1:10 PM  Vitals with BMI  Height 6' 6 6' 6 6' 5  Weight 217 lbs 6 oz 226 lbs 6 oz 235 lbs 6 oz  BMI 25.13 26.17 27.91  Systolic 100 110 889  Diastolic 64 76 70  Pulse 70 60 70  Vital signs reviewed.  Nursing notes reviewed. Weight trend reviewed. Physical Activity: Not on file   General Appearance:  No acute distress appreciable.   Well-groomed, healthy-appearing male.  Well proportioned with no abnormal fat distribution.  Good muscle tone. Pulmonary:  Normal work of breathing at rest, no respiratory distress apparent. SpO2: 98 %  Musculoskeletal: All extremities are intact.  Neurological:  Awake, alert, oriented, and engaged.  No obvious focal neurological deficits or cognitive impairments.  Sensorium seems unclouded.   Speech is clear and coherent with logical content. Psychiatric:  Appropriate mood, pleasant and cooperative demeanor, thoughtful and engaged during the exam   Results LABS Glucose: 99 mg/dL Bilirubin: Elevated Mean Corpuscular Volume (MCV): Elevated  DIAGNOSTIC Heart catheterization: Negative Echocardiogram (2021): Mildly enlarged left ventricle and left atrium     11/24/2023    2:25 PM 11/24/2022   12:38 PM 11/13/2021  5:37 AM 09/05/2020   11:57 AM  PHQ 2/9 Scores  PHQ - 2 Score 0 0 0 0    {   No results found for any visits on 11/24/23.} Office Visit on 03/11/2023  Component Date Value Ref Range Status   WBC 03/11/2023 3.8  3.8 - 10.8 Thousand/uL Final   RBC 03/11/2023 4.23  4.20 - 5.80 Million/uL Final   Hemoglobin 03/11/2023 14.3  13.2 - 17.1 g/dL Final   HCT 98/83/7974 42.7  38.5 - 50.0 % Final   MCV 03/11/2023 100.9 (H)  80.0 - 100.0 fL Final   MCH 03/11/2023 33.8 (H)  27.0 - 33.0 pg Final   MCHC 03/11/2023 33.5  32.0 - 36.0 g/dL Final   RDW 98/83/7974 12.3  11.0 - 15.0 % Final   Platelets 03/11/2023 212  140 - 400 Thousand/uL Final   MPV 03/11/2023 9.5  7.5 - 12.5 fL Final   Neutro Abs 03/11/2023 1,995  1,500 - 7,800 cells/uL Final   Absolute Lymphocytes 03/11/2023 1,102  850 - 3,900 cells/uL Final   Absolute Monocytes 03/11/2023 475  200 - 950 cells/uL Final   Eosinophils Absolute 03/11/2023 160  15 - 500 cells/uL Final   Basophils Absolute 03/11/2023 68  0 - 200 cells/uL Final   Neutrophils Relative % 03/11/2023 52.5  % Final   Total Lymphocyte 03/11/2023 29.0  % Final   Monocytes Relative 03/11/2023 12.5  % Final   Eosinophils Relative 03/11/2023 4.2  % Final   Basophils Relative 03/11/2023 1.8  % Final   Glucose, Bld 03/11/2023 101 (H)  65 - 99 mg/dL Final   BUN 98/83/7974 17  7 - 25 mg/dL Final   Creat 98/83/7974 1.16  0.70 - 1.28 mg/dL Final   eGFR 98/83/7974 65  > OR = 60 mL/min/1.19m2 Final   BUN/Creatinine Ratio 03/11/2023 SEE NOTE:  6 - 22 (calc) Final   Sodium 03/11/2023 136  135 - 146 mmol/L Final   Potassium 03/11/2023 4.8  3.5 - 5.3 mmol/L Final   Chloride 03/11/2023 100  98 - 110 mmol/L Final   CO2 03/11/2023 27  20 - 32 mmol/L Final   Calcium  03/11/2023 9.1  8.6 - 10.3 mg/dL Final   Total Protein 98/83/7974 7.0  6.1 - 8.1 g/dL Final   Albumin 98/83/7974 4.3  3.6 - 5.1 g/dL Final   Globulin 98/83/7974 2.7  1.9 - 3.7 g/dL (calc) Final   AG Ratio 03/11/2023 1.6  1.0 -  2.5 (calc) Final   Total Bilirubin 03/11/2023 2.3 (H)  0.2 - 1.2 mg/dL Final   Alkaline phosphatase (APISO) 03/11/2023 62  35 - 144 U/L Final   AST 03/11/2023 30  10 - 35 U/L Final   ALT 03/11/2023 24  9 - 46 U/L Final   Magnesium 03/11/2023 2.0  1.5 - 2.5 mg/dL Final   Cholesterol 98/83/7974 179  <200 mg/dL Final   HDL 98/83/7974 80  > OR = 40 mg/dL Final   Triglycerides 98/83/7974 64  <150 mg/dL Final   LDL Cholesterol (Calc) 03/11/2023 85  mg/dL (calc) Final   Total CHOL/HDL Ratio 03/11/2023 2.2  <4.9 (calc) Final   Non-HDL Cholesterol (Calc) 03/11/2023 99  <130 mg/dL (calc) Final   TSH 98/83/7974 2.24  0.40 - 4.50 mIU/L Final   Hgb A1c MFr Bld 03/11/2023 5.2  <5.7 % of total Hgb Final   Mean Plasma Glucose 03/11/2023 103  mg/dL Final   eAG (mmol/L) 98/83/7974 5.7  mmol/L Final   Insulin  03/11/2023 9.9  uIU/mL Final   Vit D, 25-Hydroxy 03/11/2023 68  30 - 100 ng/mL Final   PTH 03/11/2023 46  16 - 77 pg/mL Final  Office Visit on 11/24/2022  Component Date Value Ref Range Status   COLOGUARD 12/06/2022 Negative  Negative Final  Admission on 10/01/2022, Discharged on 10/01/2022  Component Date Value Ref Range Status   Sodium 09/28/2022 137  135 - 145 mmol/L Final   Potassium 09/28/2022 4.8  3.5 - 5.1 mmol/L Final   Chloride 09/28/2022 101  98 - 111 mmol/L Final   CO2 09/28/2022 27  22 - 32 mmol/L Final   Glucose, Bld 09/28/2022 103 (H)  70 - 99 mg/dL Final   BUN 91/94/7975 19  8 - 23 mg/dL Final   Creatinine, Ser 09/28/2022 1.31 (H)  0.61 - 1.24 mg/dL Final   Calcium  09/28/2022 8.9  8.9 - 10.3 mg/dL Final   GFR, Estimated 09/28/2022 56 (L)  >60 mL/min Final   Anion gap 09/28/2022 9  5 - 15 Final   SURGICAL PATHOLOGY 10/01/2022    Final-Edited                   Value:SURGICAL PATHOLOGY CASE: [id number] PATIENT: TANDA SENTERS Surgical Pathology Report     Clinical History: Mass of left foot (lp)     FINAL MICROSCOPIC DIAGNOSIS:  A. MASS, LEFT FOOT, EXCISION: -  Fibroma  GROSS DESCRIPTION:  Received in formalin is a 2.9 x 2.5 x 1.7 cm well-defined firm nodular tissue partially covered by a pink-white to pink-red smooth and glistening to focally granular surface.  Cut surface is pink-white to pale yellow, vaguely nodular.  The presumed margin of resection is inked black and a representative section is submitted in 1 block.  SW 10/01/2022   Final Diagnosis performed by Ilsa Pottier, MD.   Electronically signed 10/02/2022 Technical component performed at [facility]. [facility], [address], [place], [place] [address].  Professional component performed at [facility], [address] [place], [place] [address].  Immunohistochemistry Technical component (if applicable) was performed at [facility]. [address], [address], [place], [place] [address].   IMMUNOHISTOCHEMISTRY DISCLAIMER (if applicable): Some of these immunohistochemical stains may have been developed and the performance characteristics determine by [facility]. Some may not have been cleared or approved by the U.S. Food and Drug Administration. The FDA has determined that such clearance or approval is not necessary. This test is used for clinical purposes. It should not be regarded as investigational or for research. This laboratory is certified under the Clinical Laboratory Improvement Amendments of 1988 (CLIA-88) as qualified to perform high complexity clinical laboratory testing.  The controls stained appropriately.   IHC stains are performed on formalin fixed, paraffin embedded tissue using a 3,3diaminobenzidine (DAB) chromogen and Leica Bond Autostainer System. The staining intensity of the nucleus is score manually and is reported as the percentage of tumo                         r cell nuclei demonstrating specific nuclear staining. The specimens are fixed in 10% Neutral Formalin for at  least 6 hours and up to 72hrs. These tests are validated on decalcified tissue. Results should be interpreted with caution given the possibility of false negative results on decalcified specimens.  Antibody Clones are as follows ER-clone 36F, PR-clone 16, Ki67- clone MM1. Some of these immunohistochemical stains may have been developed and the performance characteristics determined by [facility].   Office Visit on 08/06/2022  Component Date Value Ref Range Status   WBC 08/06/2022 4.1  3.8 - 10.8 Thousand/uL Final   RBC 08/06/2022 4.13 (L)  4.20 - 5.80 Million/uL Final   Hemoglobin 08/06/2022 14.0  13.2 - 17.1 g/dL Final   HCT 93/86/7975 41.1  38.5 - 50.0 % Final   MCV 08/06/2022 99.5  80.0 - 100.0 fL Final   MCH 08/06/2022 33.9 (H)  27.0 - 33.0 pg Final   MCHC 08/06/2022 34.1  32.0 - 36.0 g/dL Final   RDW 93/86/7975 12.1  11.0 - 15.0 % Final   Platelets 08/06/2022 186  140 - 400 Thousand/uL Final   MPV 08/06/2022 9.5  7.5 - 12.5 fL Final   Neutro Abs 08/06/2022 2,046  1,500 - 7,800 cells/uL Final   Lymphs Abs 08/06/2022 1,238  850 - 3,900 cells/uL Final   Absolute Monocytes 08/06/2022 488  200 - 950 cells/uL Final   Eosinophils Absolute 08/06/2022 258  15 - 500 cells/uL Final   Basophils Absolute 08/06/2022 70  0 - 200 cells/uL Final   Neutrophils Relative % 08/06/2022 49.9  % Final   Total Lymphocyte 08/06/2022 30.2  % Final   Monocytes Relative 08/06/2022 11.9  % Final   Eosinophils Relative 08/06/2022 6.3  % Final   Basophils Relative 08/06/2022 1.7  % Final   Glucose, Bld 08/06/2022 101 (H)  65 - 99 mg/dL Final   BUN 93/86/7975 21  7 - 25 mg/dL Final   Creat 93/86/7975 1.24  0.70 - 1.28 mg/dL Final   eGFR 93/86/7975 60  > OR = 60 mL/min/1.102m2 Final   BUN/Creatinine Ratio 08/06/2022 SEE NOTE:  6 - 22 (calc) Final   Sodium 08/06/2022 140  135 - 146 mmol/L Final   Potassium 08/06/2022 4.4  3.5 - 5.3 mmol/L Final   Chloride 08/06/2022 103  98 - 110 mmol/L Final   CO2  08/06/2022 28  20 - 32 mmol/L Final   Calcium  08/06/2022 9.2  8.6 - 10.3 mg/dL Final   Total Protein 93/86/7975 6.9  6.1 - 8.1 g/dL Final   Albumin 93/86/7975 4.3  3.6 - 5.1 g/dL Final   Globulin 93/86/7975 2.6  1.9 - 3.7 g/dL (calc) Final   AG Ratio 08/06/2022 1.7  1.0 - 2.5 (calc) Final   Total Bilirubin 08/06/2022 2.0 (H)  0.2 - 1.2 mg/dL Final   Alkaline phosphatase (APISO) 08/06/2022 58  35 - 144 U/L Final   AST 08/06/2022 26  10 - 35 U/L Final   ALT 08/06/2022 19  9 - 46 U/L Final   Magnesium 08/06/2022 2.0  1.5 - 2.5 mg/dL Final   Cholesterol 93/86/7975 161  <200 mg/dL Final   HDL 93/86/7975 80  > OR = 40 mg/dL Final   Triglycerides 93/86/7975 52  <150 mg/dL Final   LDL Cholesterol (Calc) 08/06/2022 68  mg/dL (calc) Final   Total CHOL/HDL Ratio 08/06/2022 2.0  <4.9 (calc) Final   Non-HDL Cholesterol (Calc) 08/06/2022 81  <130 mg/dL (calc) Final   TSH 93/86/7975 1.24  0.40 - 4.50 mIU/L Final   Hgb A1c MFr Bld 08/06/2022 5.4  <5.7 % of total Hgb Final   Mean Plasma Glucose 08/06/2022 108  mg/dL Final   eAG (mmol/L) 93/86/7975 6.0  mmol/L Final   Insulin  08/06/2022 6.7  uIU/mL Final  Vit D, 25-Hydroxy 08/06/2022 69  30 - 100 ng/mL Final   Color, Urine 08/06/2022 YELLOW  YELLOW Final   APPearance 08/06/2022 CLEAR  CLEAR Final   Specific Gravity, Urine 08/06/2022 1.010  1.001 - 1.035 Final   pH 08/06/2022 6.0  5.0 - 8.0 Final   Glucose, UA 08/06/2022 NEGATIVE  NEGATIVE Final   Bilirubin Urine 08/06/2022 NEGATIVE  NEGATIVE Final   Ketones, ur 08/06/2022 NEGATIVE  NEGATIVE Final   Hgb urine dipstick 08/06/2022 NEGATIVE  NEGATIVE Final   Protein, ur 08/06/2022 NEGATIVE  NEGATIVE Final   Nitrite 08/06/2022 NEGATIVE  NEGATIVE Final   Leukocytes,Ua 08/06/2022 NEGATIVE  NEGATIVE Final   Creatinine, Urine 08/06/2022 71  20 - 320 mg/dL Final   Microalb, Ur 93/86/7975 3.1  mg/dL Final   Microalb Creat Ratio 08/06/2022 44 (H)  <30 mg/g creat Final   PSA 08/06/2022 <0.04  < OR = 4.00  ng/mL Final  Telephone on 06/03/2022  Component Date Value Ref Range Status   Glucose 06/04/2022 100 (H)  70 - 99 mg/dL Final   BUN 95/88/7975 18  8 - 27 mg/dL Final   Creatinine, Ser 06/04/2022 1.15  0.76 - 1.27 mg/dL Final   eGFR 95/88/7975 66  >59 mL/min/1.73 Final   BUN/Creatinine Ratio 06/04/2022 16  10 - 24 Final   Sodium 06/04/2022 138  134 - 144 mmol/L Final   Potassium 06/04/2022 4.0  3.5 - 5.2 mmol/L Final   Chloride 06/04/2022 100  96 - 106 mmol/L Final   CO2 06/04/2022 25  20 - 29 mmol/L Final   Calcium  06/04/2022 9.5  8.6 - 10.2 mg/dL Final  No image results found. No results found.       ASSESSMENT & PLAN   Assessment & Plan Macrocytosis Chronic macrocytosis is likely due to alcohol-related vitamin deficiency. B12 deficiency is a possible cause, but previous tests were normal. No immediate concern for malignancy. Order peripheral blood smear to rule out malignancy. Monitor macrocytosis and consider re-evaluation if symptoms persist. Chronic kidney disease (CKD), active medical management without dialysis, stage 2 (mild) Defers labs  Dyslipidemia Cholesterol levels are well-managed. Current use of simvastatin  is not strongly supported by evidence due to lack of significant heart disease. Vitamin D  deficiency, unspecified Defers labs  History of subarachnoid hemorrhage Previous subarachnoid hemorrhage is likely due to past hypertension rather than cholesterol plaque. Continued blood pressure management is crucial. Coronary artery disease involving native coronary artery of native heart without angina pectoris Defers labs  Paroxysmal atrial fibrillation (HCC) AFib is well-controlled, likely due to well-managed blood pressure. BMI 27.0-27.9,adult Lost weight now Body mass index is 25.12 kg/m. Encouraged weight loss continuation  Essential (primary) hypertension Hypertension is well-controlled with current medication and weight loss. Left ventricular hypertrophy is  likely due to past poorly controlled hypertension. Blood pressure readings are stable and normal. Dizziness is likely due to hydrochlorothiazide  causing low fluid levels. Discontinue hydrochlorothiazide . Monitor blood pressure regularly at home. Continue olmesartan  for blood pressure management. Tortuous aorta Thoracic aortic aneurysm without rupture, unspecified part Aortic aneurysm is well-managed and likely not growing due to controlled blood pressure. Overall health is stable with significant weight loss and lifestyle improvements. No immediate concerns requiring intervention. Schedule annual wellness visit with nurse prior to next physical examination. Schedule annual physical examination in six months.  ORDER ASSOCIATIONS  #   DIAGNOSIS / CONDITION ICD-10 ENCOUNTER ORDER     ICD-10-CM   1. Macrocytosis  D75.89     2. Chronic kidney disease (CKD),  active medical management without dialysis, stage 2 (mild)  N18.2     3. Dyslipidemia  E78.5     4. Vitamin D  deficiency, unspecified  E55.9     5. History of subarachnoid hemorrhage  Z86.79     6. Coronary artery disease involving native coronary artery of native heart without angina pectoris  I25.10     7. Paroxysmal atrial fibrillation (HCC)  I48.0     8. BMI 27.0-27.9,adult  Z68.27     9. Essential (primary) hypertension  I10     10. Tortuous aorta  I77.1     11. Thoracic aortic aneurysm without rupture, unspecified part  I71.20       I personally spent a total of 22 minutes in the care of the patient today including performing a medically appropriate exam/evaluation, counseling and educating, and documenting clinical information in the EHR.     This document was synthesized by artificial intelligence ([employer]) using HIPAA-compliant recording of the clinical interaction;   We discussed the use of AI scribe software for clinical note transcription with the patient, who gave verbal consent to proceed. additional Info: This  encounter employed state-of-the-art, real-time, collaborative documentation. The patient actively reviewed and assisted in updating their electronic medical record on a shared screen, ensuring transparency and facilitating joint problem-solving for the problem list, overview, and plan. This approach promotes accurate, informed care. The treatment plan was discussed and reviewed in detail, including medication safety, potential side effects, and all patient questions. We confirmed understanding and comfort with the plan. Follow-up instructions were established, including contacting the office for any concerns, returning if symptoms worsen, persist, or new symptoms develop, and precautions for potential emergency department visits.

## 2023-11-24 NOTE — Assessment & Plan Note (Signed)
 Aortic aneurysm is well-managed and likely not growing due to controlled blood pressure.

## 2023-11-24 NOTE — Assessment & Plan Note (Signed)
 AFib is well-controlled, likely due to well-managed blood pressure.

## 2023-11-24 NOTE — Assessment & Plan Note (Signed)
 Defers labs

## 2023-11-24 NOTE — Assessment & Plan Note (Signed)
 Previous subarachnoid hemorrhage is likely due to past hypertension rather than cholesterol plaque. Continued blood pressure management is crucial.

## 2023-11-24 NOTE — Assessment & Plan Note (Signed)
 Hypertension is well-controlled with current medication and weight loss. Left ventricular hypertrophy is likely due to past poorly controlled hypertension. Blood pressure readings are stable and normal. Dizziness is likely due to hydrochlorothiazide  causing low fluid levels. Discontinue hydrochlorothiazide . Monitor blood pressure regularly at home. Continue olmesartan  for blood pressure management.

## 2023-11-24 NOTE — Assessment & Plan Note (Signed)
 Lost weight now Body mass index is 25.12 kg/m. Encouraged weight loss continuation

## 2023-11-24 NOTE — Assessment & Plan Note (Signed)
 Cholesterol levels are well-managed. Current use of simvastatin  is not strongly supported by evidence due to lack of significant heart disease.

## 2024-01-05 ENCOUNTER — Other Ambulatory Visit: Payer: Self-pay

## 2024-01-05 MED ORDER — EZETIMIBE 10 MG PO TABS: ORAL_TABLET | ORAL | 3 refills | Status: AC

## 2024-01-06 ENCOUNTER — Ambulatory Visit

## 2024-01-06 VITALS — BP 106/62 | HR 73 | Temp 97.3°F | Ht 78.5 in | Wt 214.4 lb

## 2024-01-06 DIAGNOSIS — Z0000 Encounter for general adult medical examination without abnormal findings: Secondary | ICD-10-CM | POA: Diagnosis not present

## 2024-01-06 NOTE — Progress Notes (Signed)
 Chief Complaint  Patient presents with   Medicare Wellness     Subjective:   Kenneth Russell is a 78 y.o. male who presents for a Medicare Annual Wellness Visit.  Allergies (verified) Ultram [tramadol], Atorvastatin , and Doxazosin    History: Past Medical History:  Diagnosis Date   Anxiety    Atrial fibrillation (HCC)    B12 deficiency 09/05/2020   Lab Results      Component    Value    Date/Time           VITAMINB12    1,771 (H)    08/05/2021 11:07 AM           VITAMINB12    380    08/29/2013 05:47 PM           CAD in native artery 06/28/2019   Ct evidence of circumflex narrowing     Coronary artery disease    Gilbert's syndrome 08/06/2021   Hyperlipidemia    Hypertension    ICH (intracerebral hemorrhage) (HCC)    Mass of foot 07/08/2022   Nuclear sclerosis 03/17/2013   Other testicular hypofunction    Permanent atrial fibrillation (HCC)    Personal history of other diseases of the circulatory system 07/27/2016   Primary malignant neoplasm of prostate (HCC) 03/13/2013   Prostate cancer (HCC)    Testosterone  deficiency 03/22/2017   Vitamin D  deficiency    Past Surgical History:  Procedure Laterality Date   COLONOSCOPY     EXCISION MASS LOWER EXTREMETIES Left 10/01/2022   Procedure: Exisional biospy of left dorsal foot mass;  Surgeon: Kit Rush, MD;  Location: [facility];  Service: Orthopedics;  Laterality: Left;   LYMPHADENECTOMY Bilateral 06/23/2012   Procedure: LYMPHADENECTOMY;  Surgeon: Noretta Ferrara, MD;  Location: [facility];  Service: Urology;  Laterality: Bilateral;   ROBOT ASSISTED LAPAROSCOPIC RADICAL PROSTATECTOMY N/A 06/23/2012   Procedure: ROBOTIC ASSISTED LAPAROSCOPIC RADICAL PROSTATECTOMY LEVEL 2;  Surgeon: Noretta Ferrara, MD;  Location: [facility];  Service: Urology;  Laterality: N/A;   Family History  Problem Relation Age of Onset   Colon cancer Father    Esophageal cancer Neg Hx    Rectal cancer Neg Hx    Stomach cancer Neg Hx    Social History    Occupational History    Comment: Sales  Tobacco Use   Smoking status: Never   Smokeless tobacco: Never  Substance and Sexual Activity   Alcohol use: Yes    Alcohol/week: 5.0 standard drinks of alcohol    Types: 5 Cans of beer per week    Comment: 2-3 beers per day   Drug use: No   Sexual activity: Yes   Tobacco Counseling Counseling given: Not Answered  SDOH Screenings   Food Insecurity: No Food Insecurity (01/06/2024)  Housing: Unknown (01/06/2024)  Transportation Needs: No Transportation Needs (01/06/2024)  Utilities: Not At Risk (01/06/2024)  Depression (PHQ2-9): Low Risk  (01/06/2024)  Physical Activity: Sufficiently Active (01/06/2024)  Social Connections: Moderately Integrated (01/06/2024)  Stress: No Stress Concern Present (01/06/2024)  Tobacco Use: Low Risk  (01/06/2024)  Health Literacy: Adequate Health Literacy (01/06/2024)   See flowsheets for full screening details  Depression Screen PHQ 2 & 9 Depression Scale- Over the past 2 weeks, how often have you been bothered by any of the following problems? Little interest or pleasure in doing things: 0 Feeling down, depressed, or hopeless (PHQ Adolescent also includes...irritable): 0 PHQ-2 Total Score: 0     Goals Addressed  This Visit's Progress     maintain health (pt-stated)        Patient Stated        Maintain health and activity       Visit info / Clinical Intake: Medicare Wellness Visit Type:: Initial Annual Wellness Visit Persons participating in visit:: patient Medicare Wellness Visit Mode:: In-person (required for WTM) Information given by:: patient Interpreter Needed?: No Pre-visit prep was completed: yes Living arrangements:: lives with spouse/significant other; with family/others Patient's Overall Health Status Rating: very good Typical amount of pain: none Does pain affect daily life?: no Are you currently prescribed opioids?: no  Dietary Habits and Nutritional  Risks How many meals a day?: (!) 1 Eats fruit and vegetables daily?: (!) no (at times) Most meals are obtained by: preparing own meals; eating out Diabetic:: no  Functional Status Activities of Daily Living (to include ambulation/medication): Independent Ambulation: Independent Medication Administration: Independent Home Management: Independent Manage your own finances?: yes Primary transportation is: driving Concerns about vision?: no *vision screening is required for WTM* Concerns about hearing?: (!) yes Uses hearing aids?: no Hear whispered voice?: yes  Fall Screening Falls in the past year?: 0 Number of falls in past year: 0 Was there an injury with Fall?: 0 Fall Risk Category Calculator: 0 Patient Fall Risk Level: Low Fall Risk  Fall Risk Patient at Risk for Falls Due to: No Fall Risks Fall risk Follow up: Falls prevention discussed  Home and Transportation Safety: All rugs have non-skid backing?: N/A, no rugs All stairs or steps have railings?: yes Grab bars in the bathtub or shower?: (!) no Have non-skid surface in bathtub or shower?: yes (seat in shower) Good home lighting?: yes Hospital stays in the last year:: no  Cognitive Assessment Difficulty concentrating, remembering, or making decisions? : no Will 6CIT or Mini Cog be Completed: no 6CIT or Mini Cog Declined: patient alert, oriented, able to answer questions appropriately and recall recent events  Advance Directives (For Healthcare) Does Patient Have a Medical Advance Directive?: Yes Type of Advance Directive: Healthcare Power of Attorney Copy of Healthcare Power of Attorney in Chart?: No - copy requested  Reviewed/Updated  Reviewed/Updated: Reviewed All (Medical, Surgical, Family, Medications, Allergies, Care Teams, Patient Goals)        Objective:    Today's Vitals   01/06/24 1417  BP: 106/62  Pulse: 73  Temp: (!) 97.3 F (36.3 C)  SpO2: 96%  Weight: 214 lb 6.4 oz (97.3 kg)  Height: 6'  6.5 (1.994 m)   Body mass index is 24.46 kg/m.  Current Medications (verified) Outpatient Encounter Medications as of 01/06/2024  Medication Sig   amLODipine  (NORVASC ) 5 MG tablet Take 1 tablet (5 mg total) by mouth daily.   apixaban  (ELIQUIS ) 5 MG TABS tablet Take 1 tablet (5 mg total) by mouth 2 (two) times daily.   atenolol  (TENORMIN ) 25 MG tablet TAKE 1 TABLET BY MOUTH DAILY   Cholecalciferol (VITAMIN D3) 5000 UNITS CAPS Take 5,000 Int'l Units by mouth daily.   diphenhydrAMINE  (BENADRYL ) 25 mg capsule Take 25 mg by mouth at bedtime as needed for sleep.   ezetimibe  (ZETIA ) 10 MG tablet TAKE 1 TABLET BY MOUTH DAILY FOR CHOLESTEROL   Glucos-Chond-Hyal Ac-Ca Fructo (MOVE FREE JOINT HEALTH ADVANCE) TABS Take 1 tablet by mouth every morning.   ibuprofen (ADVIL) 200 MG tablet Take by mouth.   meclizine  (ANTIVERT ) 25 MG tablet Take 1/2 to 1 tablet 2 to 3 x /day as needed for  [employer], Dizziness /  Vertigo   olmesartan  (BENICAR ) 40 MG tablet Take  1 tablet  at Bedtime for BP   simvastatin  (ZOCOR ) 40 MG tablet TAKE 1 TABLET BY MOUTH AT BEDTIME FOR CHOLESTEROL   valACYclovir  (VALTREX ) 500 MG tablet Take  1 tablet  Daily  to Prevent Fever Blisters                                       /                                TAKE                               BY                      MOUTH   zinc gluconate 50 MG tablet Take 50 mg by mouth daily.   No facility-administered encounter medications on file as of 01/06/2024.   Hearing/Vision screen Hearing Screening - Comments:: Pt stated slight hearing loss  Vision Screening - Comments:: Wears rx glasses - up to date with routine eye exams with Pt follows up with provider on elm  Immunizations and Health Maintenance Health Maintenance  Topic Date Due   Zoster Vaccines- Shingrix (1 of 2) Never done   DTaP/Tdap/Td (2 - Td or Tdap) 02/24/2015   Influenza Vaccine  09/24/2023   COVID-19 Vaccine (7 - 2025-26 season) 10/25/2023   Medicare Annual Wellness  (AWV)  01/05/2025   Pneumococcal Vaccine: 50+ Years  Completed   Hepatitis C Screening  Completed   Meningococcal B Vaccine  Aged Out   Colonoscopy  Discontinued        Assessment/Plan:  This is a routine wellness examination for Bard.  Patient Care Team: Jesus Bernardino MATSU, MD as PCP - General (Internal Medicine) Pietro Redell RAMAN, MD as PCP - Cardiology (Cardiology) Renda Glance, MD as Consulting Physician (Urology) Debrah Lamar BIRCH, MD (Inactive) as Consulting Physician (Gastroenterology) Duwayne Purchase, MD as Consulting Physician (Orthopedic Surgery) Joshua Blamer, MD as Consulting Physician (Dermatology) Bond, Purchase Mcardle, MD as Referring Physician (Ophthalmology) Epifanio Alm HERO, MD as Referring Physician (Cardiology)  I have personally reviewed and noted the following in the patient's chart:   Medical and social history Use of alcohol, tobacco or illicit drugs  Current medications and supplements including opioid prescriptions. Functional ability and status Nutritional status Physical activity Advanced directives List of other physicians Hospitalizations, surgeries, and ER visits in previous 12 months Vitals Screenings to include cognitive, depression, and falls Referrals and appointments  No orders of the defined types were placed in this encounter.  In addition, I have reviewed and discussed with patient certain preventive protocols, quality metrics, and best practice recommendations. A written personalized care plan for preventive services as well as general preventive health recommendations were provided to patient.   Ellouise VEAR Haws, LPN   [id number]   Next year appt 01/16/25 @ 2:20 pm   After Visit Summary: (In Person-Printed) AVS printed and given to the patient  Nurse Notes: nothing significant at this time

## 2024-01-06 NOTE — Patient Instructions (Signed)
 Mr. Maahs,  Thank you for taking the time for your Medicare Wellness Visit. I appreciate your continued commitment to your health goals. Please review the care plan we discussed, and feel free to reach out if I can assist you further.  Please note that Annual Wellness Visits do not include a physical exam. Some assessments may be limited, especially if the visit was conducted virtually. If needed, we may recommend an in-person follow-up with your provider.  Ongoing Care Seeing your primary care provider every 3 to 6 months helps us  monitor your health and provide consistent, personalized care.   Referrals If a referral was made during today's visit and you haven't received any updates within two weeks, please contact the referred provider directly to check on the status.  Recommended Screenings:  Health Maintenance  Topic Date Due   Zoster (Shingles) Vaccine (1 of 2) Never done   DTaP/Tdap/Td vaccine (2 - Td or Tdap) 02/24/2015   Flu Shot  09/24/2023   COVID-19 Vaccine (7 - 2025-26 season) 10/25/2023   Medicare Annual Wellness Visit  01/05/2025   Pneumococcal Vaccine for age over 51  Completed   Hepatitis C Screening  Completed   Meningitis B Vaccine  Aged Out   Colon Cancer Screening  Discontinued       01/06/2024    2:24 PM  Advanced Directives  Does Patient Have a Medical Advance Directive? Yes  Type of Advance Directive Healthcare Power of Attorney  Copy of Healthcare Power of Attorney in Chart? No - copy requested    Vision: Annual vision screenings are recommended for early detection of glaucoma, cataracts, and diabetic retinopathy. These exams can also reveal signs of chronic conditions such as diabetes and high blood pressure.  Dental: Annual dental screenings help detect early signs of oral cancer, gum disease, and other conditions linked to overall health, including heart disease and diabetes.  Please see the attached documents for additional preventive care  recommendations.

## 2024-01-10 DIAGNOSIS — Z23 Encounter for immunization: Secondary | ICD-10-CM | POA: Diagnosis not present

## 2024-02-10 ENCOUNTER — Other Ambulatory Visit: Payer: Self-pay | Admitting: Nurse Practitioner

## 2024-03-07 ENCOUNTER — Other Ambulatory Visit: Payer: Self-pay

## 2024-03-07 ENCOUNTER — Other Ambulatory Visit: Payer: Self-pay | Admitting: Family Medicine

## 2024-03-07 MED ORDER — AMLODIPINE BESYLATE 5 MG PO TABS: 5.0000 mg | ORAL_TABLET | Freq: Every day | ORAL | 2 refills | Status: AC

## 2024-03-13 ENCOUNTER — Ambulatory Visit: Payer: Medicare Other | Admitting: Internal Medicine

## 2024-05-24 ENCOUNTER — Other Ambulatory Visit (HOSPITAL_COMMUNITY)

## 2024-05-24 ENCOUNTER — Ambulatory Visit: Admitting: Internal Medicine

## 2024-08-03 ENCOUNTER — Ambulatory Visit: Admitting: Cardiology

## 2025-01-16 ENCOUNTER — Ambulatory Visit
